# Patient Record
Sex: Female | Born: 1965 | Race: Black or African American | Hispanic: No | Marital: Married | Smoking: Former smoker
Health system: Southern US, Community
[De-identification: ages and names within clinical notes are randomized; demographics above are authoritative.]

## PROBLEM LIST (undated history)

## (undated) DIAGNOSIS — N390 Urinary tract infection, site not specified: Secondary | ICD-10-CM

## (undated) DIAGNOSIS — F419 Anxiety disorder, unspecified: Secondary | ICD-10-CM

## (undated) DIAGNOSIS — E049 Nontoxic goiter, unspecified: Secondary | ICD-10-CM

## (undated) DIAGNOSIS — A630 Anogenital (venereal) warts: Secondary | ICD-10-CM

## (undated) DIAGNOSIS — I10 Essential (primary) hypertension: Secondary | ICD-10-CM

## (undated) DIAGNOSIS — R946 Abnormal results of thyroid function studies: Secondary | ICD-10-CM

## (undated) DIAGNOSIS — K635 Polyp of colon: Secondary | ICD-10-CM

## (undated) DIAGNOSIS — Q950 Balanced translocation and insertion in normal individual: Secondary | ICD-10-CM

## (undated) HISTORY — PX: TONSILLECTOMY: SUR1361

## (undated) HISTORY — PX: CARPAL TUNNEL RELEASE: SHX101

## (undated) HISTORY — DX: Essential (primary) hypertension: I10

## (undated) HISTORY — DX: Urinary tract infection, site not specified: N39.0

## (undated) HISTORY — PX: FOOT SURGERY: SHX648

## (undated) HISTORY — DX: Balanced translocation and insertion in normal individual: Q95.0

## (undated) HISTORY — PX: UNILATERAL SALPINGECTOMY: SHX6160

## (undated) HISTORY — DX: Anogenital (venereal) warts: A63.0

## (undated) HISTORY — DX: Nontoxic goiter, unspecified: E04.9

## (undated) HISTORY — DX: Abnormal results of thyroid function studies: R94.6

## (undated) HISTORY — PX: ABDOMINAL HYSTERECTOMY: SHX81

## (undated) HISTORY — DX: Anxiety disorder, unspecified: F41.9

## (undated) HISTORY — DX: Polyp of colon: K63.5

---

## 2001-11-13 HISTORY — PX: BREAST BIOPSY: SHX20

## 2012-11-13 DIAGNOSIS — K635 Polyp of colon: Secondary | ICD-10-CM

## 2012-11-13 HISTORY — DX: Polyp of colon: K63.5

## 2013-12-22 ENCOUNTER — Emergency Department: Payer: Self-pay | Admitting: Emergency Medicine

## 2013-12-22 LAB — CBC
HCT: 38.9 % (ref 35.0–47.0)
HGB: 12.9 g/dL (ref 12.0–16.0)
MCH: 30.8 pg (ref 26.0–34.0)
MCHC: 33.2 g/dL (ref 32.0–36.0)
MCV: 93 fL (ref 80–100)
PLATELETS: 270 10*3/uL (ref 150–440)
RBC: 4.2 10*6/uL (ref 3.80–5.20)
RDW: 13.8 % (ref 11.5–14.5)
WBC: 8.1 10*3/uL (ref 3.6–11.0)

## 2013-12-22 LAB — COMPREHENSIVE METABOLIC PANEL
Albumin: 3.7 g/dL (ref 3.4–5.0)
Alkaline Phosphatase: 100 U/L
Anion Gap: 2 — ABNORMAL LOW (ref 7–16)
BILIRUBIN TOTAL: 0.4 mg/dL (ref 0.2–1.0)
BUN: 15 mg/dL (ref 7–18)
CALCIUM: 9.2 mg/dL (ref 8.5–10.1)
CHLORIDE: 106 mmol/L (ref 98–107)
Co2: 32 mmol/L (ref 21–32)
Creatinine: 0.88 mg/dL (ref 0.60–1.30)
EGFR (African American): >60
EGFR (Non-African Amer.): >60
Glucose: 88 mg/dL (ref 65–99)
OSMOLALITY: 280 (ref 275–301)
POTASSIUM: 3.4 mmol/L — AB (ref 3.5–5.1)
SGOT(AST): 33 U/L (ref 15–37)
SGPT (ALT): 35 U/L (ref 12–78)
Sodium: 140 mmol/L (ref 136–145)
Total Protein: 7.4 g/dL (ref 6.4–8.2)

## 2013-12-22 LAB — URINALYSIS, COMPLETE
Bacteria: NONE SEEN
Bilirubin,UR: NEGATIVE
Blood: NEGATIVE
Glucose,UR: NEGATIVE mg/dL (ref 0–75)
Ketone: NEGATIVE
LEUKOCYTE ESTERASE: NEGATIVE
Nitrite: NEGATIVE
Ph: 5 (ref 4.5–8.0)
Protein: NEGATIVE
SQUAMOUS EPITHELIAL: NONE SEEN
Specific Gravity: 1.025 (ref 1.003–1.030)
WBC UR: 1 /HPF (ref 0–5)

## 2013-12-22 LAB — LIPASE, BLOOD: Lipase: 166 U/L (ref 73–393)

## 2014-01-15 ENCOUNTER — Other Ambulatory Visit: Payer: Self-pay | Admitting: Family Medicine

## 2014-01-15 LAB — LIPID PANEL
Cholesterol: 191 mg/dL (ref 0–200)
HDL Cholesterol: 49 mg/dL (ref 40–60)
LDL CHOLESTEROL, CALC: 126 mg/dL — AB (ref 0–100)
TRIGLYCERIDES: 78 mg/dL (ref 0–200)
VLDL Cholesterol, Calc: 16 mg/dL (ref 5–40)

## 2014-01-15 LAB — BASIC METABOLIC PANEL
ANION GAP: 2 — AB (ref 7–16)
BUN: 14 mg/dL (ref 7–18)
Calcium, Total: 9.3 mg/dL (ref 8.5–10.1)
Chloride: 106 mmol/L (ref 98–107)
Co2: 32 mmol/L (ref 21–32)
Creatinine: 0.71 mg/dL (ref 0.60–1.30)
GLUCOSE: 77 mg/dL (ref 65–99)
Osmolality: 279 (ref 275–301)
Potassium: 3.3 mmol/L — ABNORMAL LOW (ref 3.5–5.1)
Sodium: 140 mmol/L (ref 136–145)

## 2014-01-15 LAB — CBC WITH DIFFERENTIAL/PLATELET
Basophil #: 0 10*3/uL (ref 0.0–0.1)
Basophil %: 0.8 %
EOS ABS: 0.2 10*3/uL (ref 0.0–0.7)
Eosinophil %: 4.2 %
HCT: 38.9 % (ref 35.0–47.0)
HGB: 13.2 g/dL (ref 12.0–16.0)
LYMPHS ABS: 2.5 10*3/uL (ref 1.0–3.6)
Lymphocyte %: 50.9 %
MCH: 31.2 pg (ref 26.0–34.0)
MCHC: 34.1 g/dL (ref 32.0–36.0)
MCV: 92 fL (ref 80–100)
Monocyte #: 0.4 x10 3/mm (ref 0.2–0.9)
Monocyte %: 7.1 %
NEUTROS ABS: 1.8 10*3/uL (ref 1.4–6.5)
Neutrophil %: 37 %
Platelet: 251 10*3/uL (ref 150–440)
RBC: 4.24 10*6/uL (ref 3.80–5.20)
RDW: 13.6 % (ref 11.5–14.5)
WBC: 5 10*3/uL (ref 3.6–11.0)

## 2014-01-15 LAB — HEPATIC FUNCTION PANEL A (ARMC)
ALBUMIN: 3.9 g/dL (ref 3.4–5.0)
Alkaline Phosphatase: 114 U/L
BILIRUBIN TOTAL: 0.6 mg/dL (ref 0.2–1.0)
Bilirubin, Direct: 0.1 mg/dL (ref 0.00–0.20)
SGOT(AST): 18 U/L (ref 15–37)
SGPT (ALT): 25 U/L (ref 12–78)
Total Protein: 7.6 g/dL (ref 6.4–8.2)

## 2014-01-15 LAB — TSH: Thyroid Stimulating Horm: 0.421 u[IU]/mL — ABNORMAL LOW

## 2014-01-19 ENCOUNTER — Ambulatory Visit: Payer: Self-pay | Admitting: Gastroenterology

## 2014-02-12 ENCOUNTER — Other Ambulatory Visit: Payer: Self-pay | Admitting: Family Medicine

## 2014-02-12 LAB — BASIC METABOLIC PANEL
ANION GAP: 3 — AB (ref 7–16)
BUN: 12 mg/dL (ref 7–18)
CALCIUM: 9.1 mg/dL (ref 8.5–10.1)
CHLORIDE: 102 mmol/L (ref 98–107)
CO2: 31 mmol/L (ref 21–32)
Creatinine: 0.69 mg/dL (ref 0.60–1.30)
EGFR (African American): >60
GLUCOSE: 84 mg/dL (ref 65–99)
OSMOLALITY: 271 (ref 275–301)
Potassium: 3.5 mmol/L (ref 3.5–5.1)
SODIUM: 136 mmol/L (ref 136–145)

## 2014-02-12 LAB — TSH: THYROID STIMULATING HORM: 0.328 u[IU]/mL — AB

## 2014-02-12 LAB — T4, FREE: Free Thyroxine: 0.83 ng/dL (ref 0.76–1.46)

## 2014-02-19 ENCOUNTER — Telehealth: Payer: Self-pay | Admitting: *Deleted

## 2014-02-19 DIAGNOSIS — R102 Pelvic and perineal pain: Secondary | ICD-10-CM

## 2014-02-19 NOTE — Telephone Encounter (Signed)
Appointment for ultrasound 02/27/2014 @ 1115.  Spoke with patient, pt request to have ultrasound study at [facility].  Informed patient that she would have to ensure records are sent to [facility].  Call to [facility] ultrasound, they request for Dr. Pat Patrick to enter orders since he has privileges at [facility].  Call to Dr. Rolin Barry office, awaiting nurse to call back for instructions.  Amber called from Dr. Rolin Barry office and will schedule ultrasound at [facility]/inform patient.  Fax number given to have ultrasound results sent to [facility]

## 2014-02-24 ENCOUNTER — Ambulatory Visit: Payer: Self-pay | Admitting: Surgery

## 2014-02-27 ENCOUNTER — Ambulatory Visit (HOSPITAL_COMMUNITY): Payer: Private Health Insurance - Indemnity

## 2014-03-27 ENCOUNTER — Ambulatory Visit (INDEPENDENT_AMBULATORY_CARE_PROVIDER_SITE_OTHER): Payer: Private Health Insurance - Indemnity | Admitting: Nurse Practitioner

## 2014-03-27 ENCOUNTER — Encounter: Payer: Self-pay | Admitting: Nurse Practitioner

## 2014-03-27 VITALS — BP 109/71 | HR 64 | Temp 98.0°F | Ht 64.0 in | Wt 203.6 lb

## 2014-03-27 DIAGNOSIS — R102 Pelvic and perineal pain: Principal | ICD-10-CM

## 2014-03-27 DIAGNOSIS — E669 Obesity, unspecified: Secondary | ICD-10-CM

## 2014-03-27 DIAGNOSIS — N949 Unspecified condition associated with female genital organs and menstrual cycle: Secondary | ICD-10-CM

## 2014-03-27 DIAGNOSIS — G8929 Other chronic pain: Secondary | ICD-10-CM

## 2014-03-27 NOTE — Patient Instructions (Signed)
Pelvic Pain, Female °Female pelvic pain can be caused by many different things and start from a variety of places. Pelvic pain refers to pain that is located in the lower half of the abdomen and between your hips. The pain may occur over a short period of time (acute) or may be reoccurring (chronic). The cause of pelvic pain may be related to disorders affecting the female reproductive organs (gynecologic), but it may also be related to the bladder, kidney stones, an intestinal complication, or muscle or skeletal problems. Getting help right away for pelvic pain is important, especially if there has been severe, sharp, or a sudden onset of unusual pain. It is also important to get help right away because some types of pelvic pain can be life threatening.  °CAUSES  °Below are only some of the causes of pelvic pain. The causes of pelvic pain can be in one of several categories.  °· Gynecologic. °· Pelvic inflammatory disease. °· Sexually transmitted infection. °· Ovarian cyst or a twisted ovarian ligament (ovarian torsion). °· Uterine lining that grows outside the uterus (endometriosis). °· Fibroids, cysts, or tumors. °· Ovulation. °· Pregnancy. °· Pregnancy that occurs outside the uterus (ectopic pregnancy). °· Miscarriage. °· Labor. °· Abruption of the placenta or ruptured uterus. °· Infection. °· Uterine infection (endometritis). °· Bladder infection. °· Diverticulitis. °· Miscarriage related to a uterine infection (septic abortion). °· Bladder. °· Inflammation of the bladder (cystitis). °· Kidney stone(s). °· Gastrointenstinal. °· Constipation. °· Diverticulitis. °· Neurologic. °· Trauma. °· Feeling pelvic pain because of mental or emotional causes (psychosomatic). °· Cancers of the bowel or pelvis. °EVALUATION  °Your caregiver will want to take a careful history of your concerns. This includes recent changes in your health, a careful gynecologic history of your periods (menses), and a sexual history. Obtaining  your family history and medical history is also important. Your caregiver may suggest a pelvic exam. A pelvic exam will help identify the location and severity of the pain. It also helps in the evaluation of which organ system may be involved. In order to identify the cause of the pelvic pain and be properly treated, your caregiver may order tests. These tests may include:  °· A pregnancy test. °· Pelvic ultrasonography. °· An X-ray exam of the abdomen. °· A urinalysis or evaluation of vaginal discharge. °· Blood tests. °HOME CARE INSTRUCTIONS  °· Only take over-the-counter or prescription medicines for pain, discomfort, or fever as directed by your caregiver.   °· Rest as directed by your caregiver.   °· Eat a balanced diet.   °· Drink enough fluids to make your urine clear or pale yellow, or as directed.   °· Avoid sexual intercourse if it causes pain.   °· Apply warm or cold compresses to the lower abdomen depending on which one helps the pain.   °· Avoid stressful situations.   °· Keep a journal of your pelvic pain. Write down when it started, where the pain is located, and if there are things that seem to be associated with the pain, such as food or your menstrual cycle. °· Follow up with your caregiver as directed.   °SEEK MEDICAL CARE IF: °· Your medicine does not help your pain. °· You have abnormal vaginal discharge. °SEEK IMMEDIATE MEDICAL CARE IF:  °· You have heavy bleeding from the vagina.   °· Your pelvic pain increases.   °· You feel lightheaded or faint.   °· You have chills.   °· You have pain with urination or blood in your urine.   °· You have uncontrolled   diarrhea or vomiting.   °· You have a fever or persistent symptoms for more than 3 days. °· You have a fever and your symptoms suddenly get worse.   °· You are being physically or sexually abused.   °MAKE SURE YOU: °· Understand these instructions. °· Will watch your condition. °· Will get help if you are not doing well or get worse. °Document  Released: 09/26/2004 Document Revised: 04/30/2012 Document Reviewed: 02/19/2012 °ExitCare® Patient Information ©2014 [employer]. ° °

## 2014-03-27 NOTE — Progress Notes (Signed)
History:  Kelly Pittman is a 48 y.o. G3P0030 who presents to [facility] clinic today for chronic pelvic pain. She moved here from [place] . She started having pain 8 years ago and was determined to have fibroids. She had a hysterectomy and was better for some time. Then she started having pain around her left ovary and saw multiple MDs. She had a CT of pelvis which was negative. She was taken to surgery and found to have scar tissue wrapping around her bowel. This was cleaned up and all was going well until about 1 year ago when the pain came back on the right ovary. She has seen PCP who sent her to GI and her colonoscopy was negative and he referred her to GYN clinic. They ordered an ultrasound which was negative. The patients desire is to have another surgery to remove right ovary and scar tissue. She has not been on any HRT.   The following portions of the patient's history were reviewed and updated as appropriate: allergies, current medications, past family history, past medical history, past social history, past surgical history and problem list.  Review of Systems:  Pertinent items are noted in HPI.  Objective:  Physical Exam BP 109/71  Pulse 64  Temp(Src) 98 F (36.7 C) (Oral)  Ht 5\' 4"  (1.626 m)  Wt 203 lb 9.6 oz (92.352 kg)  BMI 34.93 kg/m2 GENERAL: Well-developed, well-nourished female in no acute distress.  Obese HEENT: Normocephalic, atraumatic.  NECK: Supple. Normal thyroid.  LUNGS: Normal rate. Clear to auscultation bilaterally.  HEART: Regular rate and rhythm with no adventitious sounds.  ABDOMEN: Soft, nontender, nondistended. No organomegaly. Normal bowel sounds appreciated in all quadrants.  PELVIC: Normal external female genitalia. Vagina is pink and rugated.  Normal discharge.  Slight right  adnexal  tenderness.  EXTREMITIES: No cyanosis, clubbing, or edema, 2+ distal pulses.   Labs and Imaging No results found.  Assessment & Plan:  Assessment:  A: Chronic pelvic  pain  Plans:  Discussed patient with Dr Ihor Dow who advised referral to [facility] pelvic specialist and patient will be called back with that name and phone number  Advised her to establish GYN care  Olegario Messier, NP 03/27/2014 1:30 PM

## 2014-03-30 ENCOUNTER — Telehealth: Payer: Self-pay | Admitting: *Deleted

## 2014-03-30 NOTE — Telephone Encounter (Signed)
Message copied by Sue Lush on Mon Mar 30, 2014 10:35 AM ------      Message from: Excell Seltzer      Created: Fri Mar 27, 2014  5:04 PM      Regarding: Release of Information Needed       This patient was seen in clinic today 03/27/14.  She lives in [place] and was previously in [place].  She is going to be referred to [facility].  We need to have her come by the office to sign a release of information so we can get her records from [place] to send with the referral.  Could you please contact the patient and ask her to come by to sign the Release of Information and let us know which office she was seen at?      Thanks,      Claiborne Billings ------

## 2014-03-30 NOTE — Telephone Encounter (Signed)
Spoke with patient about ROI, patient's fax machine does not work at this time.  Desires to have form emailed to her, Cathlean Marseilles approved.  Email address obtained [email or web address]. Informed patient that we need the clinic's information in [place].  Pt verbalized understanding.

## 2014-04-02 ENCOUNTER — Telehealth: Payer: Self-pay

## 2014-04-02 NOTE — Telephone Encounter (Signed)
Called Dr. Kirk Ruths office; provider is retired, however, it was his Network engineer. Administrator stated we simply needed to fax clinic notes and reason patient needed to be seen along with patient's contact information to [phone]. Per Cathlean Marseilles, ROI has been faxed to patient's provider in [place] (was confirmed they received it). Awaiting records to be faxed to [place]; once all records are received we can fax [place] records along with patient's last clinic note to fax number listed above. Called patient to inform her we are working on referral; gave her [facility] Office number [phone]) so that she can follow up on referral in the next week or two.

## 2014-04-02 NOTE — Telephone Encounter (Signed)
Message copied by Geanie Logan on Thu Apr 02, 2014  4:24 PM ------      Message from: BAREFOOT, [place] M      Created: Wed Apr 01, 2014  7:53 PM       Can we call this patient to refer her for pelvic surgery in [facility]. The doctor is Dr Elby Showers at [phone]. The appointment does not have to be with him exactly anyone in group is fine. She will need to get her records together from [place], thanks, Danielson ------

## 2014-04-07 NOTE — Telephone Encounter (Signed)
Called Dr. Maxie Barb office in [place] [phone])-- stated they received fax on Thursday and will fax records today.

## 2014-04-08 NOTE — Telephone Encounter (Signed)
Called Dr. Maxie Barb office again to notify them we have not received records and gave them our fax number again. She stated she will check into it.

## 2014-04-09 NOTE — Telephone Encounter (Signed)
Called Dr. Maxie Barb office and spoke to [place] who asked that we refax ROI and she will get to it today. ROI refaxed. Awaiting records.

## 2014-04-10 NOTE — Telephone Encounter (Signed)
Called Dr. Maxie Barb office again. Spoke to [place] in billing who confirmed she did receive ROI yesterday, however, they have been short staffed and she is behind. Will attempt to get records to [place] by the end of day today.

## 2014-04-14 NOTE — Telephone Encounter (Signed)
Received all office notes as well as ultrasound results from Dr. Maxie Barb office. Records and latest office visit here in clinic faxed along with patient's contact information to [phone] [facility] [place]); fax successful. Office receptionist stated a nurse would review the referral and the office would call patient with appointment. Called patient and informed her that records have been faxed and she should be contacted with appointment within the next few days-- number to office given so that she may call and check up on referral appointment. No questions or concerns.

## 2014-04-14 NOTE — Telephone Encounter (Signed)
Received patient's pap and mammogram results, however, no office notes faxed. Called Dr. Maxie Barb office. Spoke to [place] who apologized for not faxing office notes-- has two in patient's chart that she will fax today. Once received can fax records with our office note to [phone].

## 2014-04-29 ENCOUNTER — Encounter: Payer: Self-pay | Admitting: *Deleted

## 2014-05-01 DIAGNOSIS — R109 Unspecified abdominal pain: Secondary | ICD-10-CM | POA: Insufficient documentation

## 2014-06-02 ENCOUNTER — Ambulatory Visit: Payer: Self-pay | Admitting: Family Medicine

## 2014-06-05 DIAGNOSIS — R198 Other specified symptoms and signs involving the digestive system and abdomen: Secondary | ICD-10-CM | POA: Insufficient documentation

## 2014-06-18 ENCOUNTER — Encounter: Payer: Self-pay | Admitting: Obstetrics and Gynecology

## 2014-07-08 ENCOUNTER — Encounter: Payer: Self-pay | Admitting: *Deleted

## 2014-07-14 ENCOUNTER — Encounter: Payer: Self-pay | Admitting: Obstetrics and Gynecology

## 2014-08-13 ENCOUNTER — Encounter: Payer: Self-pay | Admitting: Obstetrics and Gynecology

## 2014-08-27 ENCOUNTER — Ambulatory Visit: Payer: Self-pay | Admitting: Family Medicine

## 2014-08-27 LAB — COMPREHENSIVE METABOLIC PANEL
ALBUMIN: 3.7 g/dL (ref 3.4–5.0)
ANION GAP: 6 — AB (ref 7–16)
Alkaline Phosphatase: 108 U/L
BUN: 12 mg/dL (ref 7–18)
Bilirubin,Total: 0.2 mg/dL (ref 0.2–1.0)
Calcium, Total: 8.8 mg/dL (ref 8.5–10.1)
Chloride: 105 mmol/L (ref 98–107)
Co2: 29 mmol/L (ref 21–32)
Creatinine: 0.79 mg/dL (ref 0.60–1.30)
EGFR (African American): >60
Glucose: 73 mg/dL (ref 65–99)
POTASSIUM: 3.9 mmol/L (ref 3.5–5.1)
SGOT(AST): 26 U/L (ref 15–37)
SGPT (ALT): 28 U/L
Sodium: 140 mmol/L (ref 136–145)
Total Protein: 7.5 g/dL (ref 6.4–8.2)

## 2014-08-27 LAB — CBC WITH DIFFERENTIAL/PLATELET
BASOS PCT: 0.7 %
Basophil #: 0.1 10*3/uL (ref 0.0–0.1)
EOS PCT: 3.1 %
Eosinophil #: 0.2 10*3/uL (ref 0.0–0.7)
HCT: 41.5 % (ref 35.0–47.0)
HGB: 13.3 g/dL (ref 12.0–16.0)
LYMPHS ABS: 4.3 10*3/uL — AB (ref 1.0–3.6)
Lymphocyte %: 53.5 %
MCH: 30.4 pg (ref 26.0–34.0)
MCHC: 32.1 g/dL (ref 32.0–36.0)
MCV: 95 fL (ref 80–100)
MONOS PCT: 4.7 %
Monocyte #: 0.4 x10 3/mm (ref 0.2–0.9)
Neutrophil #: 3 10*3/uL (ref 1.4–6.5)
Neutrophil %: 38 %
Platelet: 263 10*3/uL (ref 150–440)
RBC: 4.38 10*6/uL (ref 3.80–5.20)
RDW: 13.3 % (ref 11.5–14.5)
WBC: 8 10*3/uL (ref 3.6–11.0)

## 2014-08-27 LAB — HEPATIC FUNCTION PANEL A (ARMC): Bilirubin, Direct: <0.1 mg/dL (ref 0.00–0.20)

## 2014-09-13 ENCOUNTER — Encounter: Payer: Self-pay | Admitting: Obstetrics and Gynecology

## 2014-09-14 ENCOUNTER — Encounter: Payer: Self-pay | Admitting: Nurse Practitioner

## 2014-10-13 ENCOUNTER — Encounter: Payer: Self-pay | Admitting: Obstetrics and Gynecology

## 2014-11-13 ENCOUNTER — Encounter: Payer: Self-pay | Admitting: Obstetrics and Gynecology

## 2014-12-14 ENCOUNTER — Encounter: Payer: Self-pay | Admitting: Obstetrics and Gynecology

## 2014-12-17 ENCOUNTER — Ambulatory Visit: Payer: Self-pay | Admitting: Family Medicine

## 2014-12-17 LAB — COMPREHENSIVE METABOLIC PANEL
ALBUMIN: 3.9 g/dL (ref 3.4–5.0)
AST: 25 U/L (ref 15–37)
Alkaline Phosphatase: 105 U/L (ref 46–116)
Anion Gap: 5 — ABNORMAL LOW (ref 7–16)
BUN: 13 mg/dL (ref 7–18)
Bilirubin,Total: 0.4 mg/dL (ref 0.2–1.0)
CALCIUM: 9.3 mg/dL (ref 8.5–10.1)
Chloride: 102 mmol/L (ref 98–107)
Co2: 34 mmol/L — ABNORMAL HIGH (ref 21–32)
Creatinine: 0.84 mg/dL (ref 0.60–1.30)
Glucose: 73 mg/dL (ref 65–99)
Osmolality: 280 (ref 275–301)
Potassium: 3.2 mmol/L — ABNORMAL LOW (ref 3.5–5.1)
SGPT (ALT): 22 U/L (ref 14–63)
Sodium: 141 mmol/L (ref 136–145)
Total Protein: 8.1 g/dL (ref 6.4–8.2)

## 2014-12-17 LAB — CBC WITH DIFFERENTIAL/PLATELET
BASOS ABS: 0.1 10*3/uL (ref 0.0–0.1)
Basophil %: 0.8 %
Eosinophil #: 0.2 10*3/uL (ref 0.0–0.7)
Eosinophil %: 2.8 %
HCT: 41.1 % (ref 35.0–47.0)
HGB: 13.8 g/dL (ref 12.0–16.0)
LYMPHS ABS: 3.9 10*3/uL — AB (ref 1.0–3.6)
Lymphocyte %: 55.2 %
MCH: 31.1 pg (ref 26.0–34.0)
MCHC: 33.6 g/dL (ref 32.0–36.0)
MCV: 93 fL (ref 80–100)
MONOS PCT: 6.1 %
Monocyte #: 0.4 x10 3/mm (ref 0.2–0.9)
NEUTROS ABS: 2.5 10*3/uL (ref 1.4–6.5)
Neutrophil %: 35.1 %
PLATELETS: 285 10*3/uL (ref 150–440)
RBC: 4.43 10*6/uL (ref 3.80–5.20)
RDW: 13.5 % (ref 11.5–14.5)
WBC: 7 10*3/uL (ref 3.6–11.0)

## 2014-12-17 LAB — TSH: THYROID STIMULATING HORM: 0.658 u[IU]/mL

## 2015-01-12 ENCOUNTER — Encounter: Payer: Self-pay | Admitting: Obstetrics and Gynecology

## 2015-02-18 DIAGNOSIS — R0982 Postnasal drip: Secondary | ICD-10-CM | POA: Insufficient documentation

## 2015-02-18 DIAGNOSIS — I10 Essential (primary) hypertension: Secondary | ICD-10-CM | POA: Insufficient documentation

## 2015-02-18 DIAGNOSIS — F411 Generalized anxiety disorder: Secondary | ICD-10-CM | POA: Insufficient documentation

## 2015-02-18 DIAGNOSIS — J302 Other seasonal allergic rhinitis: Secondary | ICD-10-CM | POA: Insufficient documentation

## 2015-02-18 HISTORY — DX: Postnasal drip: R09.82

## 2015-02-18 HISTORY — DX: Generalized anxiety disorder: F41.1

## 2016-02-23 ENCOUNTER — Ambulatory Visit: Payer: BLUE CROSS/BLUE SHIELD | Attending: Unknown Physician Specialty | Admitting: Physical Therapy

## 2016-02-23 DIAGNOSIS — R262 Difficulty in walking, not elsewhere classified: Secondary | ICD-10-CM | POA: Insufficient documentation

## 2016-02-23 DIAGNOSIS — M791 Myalgia, unspecified site: Secondary | ICD-10-CM

## 2016-02-23 DIAGNOSIS — M533 Sacrococcygeal disorders, not elsewhere classified: Secondary | ICD-10-CM

## 2016-02-23 DIAGNOSIS — M6281 Muscle weakness (generalized): Secondary | ICD-10-CM

## 2016-02-24 NOTE — Patient Instructions (Signed)
Diaphragmatic Breathing/ Pelvic breathing to relax pelvic floor muscles  Scaro-iliac joint re-alignment: 2x  /day  Muscle energy technique 5 sec holds R thigh up against palm, pressing L foot down into foot   Standing with swinging leg back (toe touch) back 10 reps x 3 Left side only

## 2016-02-24 NOTE — Therapy (Signed)
[place] MAIN [facility] SERVICES [address] [place], [place], [address] Phone: [phone]   Fax:  [phone]  Physical Therapy Evaluation  Patient Details  Name: Kelly Pittman MRN: [id number] Date of Birth: 1966/01/06 Referring Provider: Ammie Dalton  Encounter Date: 02/23/2016      PT End of Session - 02/24/16 2319    Visit Number 1   Number of Visits 12   Date for PT Re-Evaluation 05/17/16   PT Start Time 1505   PT Stop Time 1610   PT Time Calculation (min) 65 min   Activity Tolerance Patient tolerated treatment well;No increased pain   Behavior During Therapy [facility] for tasks assessed/performed      Past Medical History  Diagnosis Date  . Hypertension   . Anxiety     Past Surgical History  Procedure Laterality Date  . Abdominal hysterectomy    . Unilateral salpingectomy      left  . Carpal tunnel release      There were no vitals filed for this visit.           [facility] PT Assessment - 02/24/16 2315    Assessment   Medical Diagnosis Pelvic pain    Referring Provider Ammie Dalton   Precautions   Precautions None   Restrictions   Weight Bearing Restrictions No   Balance Screen   Has the patient fallen in the past 6 months No   Observation/Other Assessments   Observations hyperextended knees, flat feet    Other Surveys  --  LEFS 66%, PDI 39%    Step Down   Comments LOB with RLE step down   Single Leg Stance   Comments poor stability with R SLS, L SLS    AROM   Overall AROM Comments L thigh adduction with pain   (Post Tx, no pain with L hip adduction/IR)   PROM   Overall PROM Comments hip IR R:15deg w/ springy endfeel with compensation of lumbar movment, L 20 deg   (post-Tx: increased R hip IR to 20 deg    Strength   Overall Strength Comments R eversion, inversion, plantarflexion 2/5 (10 reps) , L plantarflexion 3/5 for 10 reps, 3/5 eversion. inversion    Palpation   SI assessment  R ASIS more posterior >  L. Tightness at L sacrotuberious ligament with limited and painful L hip extension PROM. (post Tx: more symmetry in pelvic girdle and increase hip ext without pain)    Palpation comment pain with L pubic rami,    Ambulation/Gait   Gait velocity 1.05 m/s    Gait Comments excessive knee ext in swing on LLE, decreased weight bearing on stance bilaterally                  Pelvic Floor Special Questions - 02/23/16 1622    Pelvic Floor Internal Exam pt consented verbally and had no contraindications   Exam Type Vaginal   Palpation noted tenderness on R obt int anterior , tightness on posterior 2-3rd muscles on L. Cued for relaxation of pelvic floor and pt was able to decrease tenderenss and tightness           [facility] Adult PT Treatment/Exercise - 02/23/16 1537    Manual Therapy   Manual therapy comments Sidelying, rotational mob Grade II to faciliate L hip ext, PA mobs L lateral border of sacrum, STM along L sacrotuberous   ligament  PT Education - 02/24/16 2319    Education provided Yes   Education Details POC, goals, anatomy/physiology    Person(s) Educated Patient   Methods Explanation;Demonstration;Verbal cues;Tactile cues;Handout   Comprehension Returned demonstration;Verbalized understanding             PT Long Term Goals - 02/24/16 2320    PT LONG TERM GOAL #1   Title Pt will decrease her score on [place] from 39% to < 29% in order particiapte in community and social events.    Time 12   Period Weeks   Status New   PT LONG TERM GOAL #2   Title Pt will decrease her score on LEFS from 66% to < 50% in order to improve balance and minimize future risk for injuries.   Time 12   Period Weeks   Status New   PT LONG TERM GOAL #3   Title Pt will demo no asymmetries in her sacroiliac joint across two visits in order to minimize pelvic pain and foot pain.    Time 12   Period Weeks   Status New   PT LONG TERM GOAL #4   Title Pt will demo increased  ankle strength (eversion/inversion/ plantarflexion ) to 4/5 bilaterally in order to walk longer distances   Time 12   Period Weeks   Status New   PT LONG TERM GOAL #5   Title Pt will increase her walking speed from 1.05 m/s to >1.2 m/s in order to cross the street safely.   Time 12   Period Weeks   Status New               Plan - 02/24/16 2326    Clinical Impression Statement Pt is a 50 yo female who complains of  pelvic pain and ankle pain in addition to ankle weakness, and instability. These deficits impact her  ability to stand for long periods at work and performing household chores.   Through PT exam findings, pt's deficits are contributed to gait  deviations she incurred when wearing a cast from her Achilles Fx which  occured last year.  Pt demo'd pelvic obliquities, increased pelvic floor mm  and hip girdle soft-tissue tensions, ankle weakness, gait deviations, and poor balance. Pt  tolerated manual Tx today and showed more symmetrically alignment SIJ,  increased hip ROM, and reported decreased pain.        Rehab Potential Good   PT Frequency 1x / week   PT Duration 12 weeks   PT Treatment/Interventions Aquatic Therapy;ADLs/Self Care Home Management;Gait training;Moist Heat;Traction;Stair training;Functional mobility training;Therapeutic activities;Therapeutic exercise;Balance training;Neuromuscular re-education;Manual techniques;Patient/family education;Passive range of motion;Energy conservation;Dry needling;Taping   Consulted and Agree with Plan of Care Patient      Patient will benefit from skilled therapeutic intervention in order to improve the following deficits and impairments:  Abnormal gait, Improper body mechanics, Pain, Postural dysfunction, Increased muscle spasms, Decreased mobility, Decreased coordination, Decreased activity tolerance, Decreased endurance, Decreased range of motion, Hypomobility, Decreased strength, Decreased balance, Decreased safety  awareness, Difficulty walking, Impaired flexibility  Visit Diagnosis:  Sacrococcygeal disorders, not elsewhere classified - Plan: PT plan of care cert/re-cert Difficulty in walking, not elsewhere classified - Plan: PT plan of care cert/re-cert Myalgia - Plan: PT plan of care cert/re-cert  Muscle weakness (generalized) - Plan: PT plan of care cert/re-cert     Problem List Patient Active Problem List   Diagnosis Date Noted  . Chronic pelvic pain in female 03/27/2014  . Obesity 03/27/2014    Talmadge Chad  Lanora Manis, DPT, E-RYT  02/24/2016, 11:34 PM  [place] MAIN [facility] SERVICES [address] [place], [place], [address] Phone: [phone]   Fax:  [phone]  Name: Nyia Cornia MRN: [id number] Date of Birth: [birth year]

## 2016-03-01 ENCOUNTER — Ambulatory Visit: Payer: BLUE CROSS/BLUE SHIELD | Admitting: Physical Therapy

## 2016-03-01 DIAGNOSIS — M533 Sacrococcygeal disorders, not elsewhere classified: Secondary | ICD-10-CM | POA: Diagnosis not present

## 2016-03-01 DIAGNOSIS — R262 Difficulty in walking, not elsewhere classified: Secondary | ICD-10-CM

## 2016-03-01 DIAGNOSIS — M791 Myalgia, unspecified site: Secondary | ICD-10-CM

## 2016-03-01 DIAGNOSIS — M6281 Muscle weakness (generalized): Secondary | ICD-10-CM

## 2016-03-03 NOTE — Therapy (Addendum)
[place] MAIN [facility] SERVICES [address] [place], [place], [address] Phone: [phone]   Fax:  [phone]  Physical Therapy Treatment  Patient Details  Name: Kelly Pittman MRN: [id number] Date of Birth: 06-12-1966 Referring Provider: Ammie Dalton  Encounter Date: 03/01/2016      PT End of Session - 03/03/16 0036    Visit Number 2   Number of Visits 12   Date for PT Re-Evaluation 05/17/16   PT Start Time 1500   PT Stop Time 1630   PT Time Calculation (min) 90 min   Activity Tolerance Patient tolerated treatment well;No increased pain   Behavior During Therapy [facility] for tasks assessed/performed      Past Medical History  Diagnosis Date  . Hypertension   . Anxiety     Past Surgical History  Procedure Laterality Date  . Abdominal hysterectomy    . Unilateral salpingectomy      left  . Carpal tunnel release      There were no vitals filed for this visit.      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 Pt return to working and her hip started bothering her in addition to her R foot pain.    Patient Stated Goals To not have pelvic pain             [facility] PT Assessment - 03/03/16 0025    AROM   Overall AROM Comments DF standing R 50 deg, L 70 deg (gastroc)     PROM   Overall PROM Comments PROM in supine R DF 0 deg, L DF 20 deg    Palpation   SI assessment  minor tenderness on R ASIS, more symmetry noted   Palpation comment .5 cm greater on R lateral malleolus compared to L (figure -8)   Ambulation/Gait   Gait Comments decreased stance phase on R                     [facility] Adult PT Treatment/Exercise - 03/03/16 0025    Therapeutic Activites    Other Therapeutic Activities feet propped under knees, heel pumps (PF) when seated.  when standing, semi tandem stance (DF) , mini  squats    Neuro Re-ed    Neuro Re-ed Details  gait mechanics    Manual Therapy   Manual therapy comments distraction/ AP mobs at talocural Grade III 90 sec, manual lymph drainage abdomen, neck, flank, legs    taping to decrease swelling                PT Education - 03/03/16 0035    Education provided Yes   Education Details HEP   Person(s) Educated Patient   Methods Explanation;Demonstration;Tactile cues;Verbal cues;Handout   Comprehension Returned demonstration;Verbalized understanding             PT Long Term Goals - 02/24/16 2320    PT LONG TERM GOAL #1   Title Pt will decrease her score on [place] from 39% to < 29% in order particiapte in community and social events.    Time 12   Period Weeks   Status New   PT LONG TERM GOAL #2   Title Pt will decrease her score on LEFS from 66% to < 50% in order to improve balance and minimize future risk for injuries.   Time 12  Period Weeks   Status New   PT LONG TERM GOAL #3   Title Pt will demo no asymmetries in her sacroiliac joint across two visits in order to minimize pelvic pain and foot pain.    Time 12   Period Weeks   Status New   PT LONG TERM GOAL #4   Title Pt will demo increased ankle strength (eversion/inversion/ plantarflexion ) to 4/5 bilaterally in order to walk longer distances   Time 12   Period Weeks   Status New   PT LONG TERM GOAL #5   Title Pt will increase her walking speed from 1.05 m/s to >1.2 m/s in order to cross the street safely.   Time 12   Period Weeks   Status New               Plan - 03/03/16 0038    Clinical Impression Statement Pt reported no L groin pain over the weekend after last session despite strenuous activities (ground to rise, lifting when gardening). However, pt's Sx at L groin and R foot pain  returned after returning to work. Suspect L groin pain may be related to R foot pain/ injury. Today, PT addressed swelling at lateral malleoli on R, modifications to standing  and sitting postures at work in order to stimulate lymph flow/minimize swelling/ increased DF ROM.  Pt will need to increase intrinsic feet mm strength and arch of her feet in order to improve pain, decrease swelling, increace propioception, balance, and gait speed. Pt will continue to required skilled PT to advance towards her goals.   Rehab Potential Good   PT Frequency 1x / week   PT Duration 12 weeks   PT Treatment/Interventions Aquatic Therapy;ADLs/Self Care Home Management;Gait training;Moist Heat;Traction;Stair training;Functional mobility training;Therapeutic activities;Therapeutic exercise;Balance training;Neuromuscular re-education;Manual techniques;Patient/family education;Passive range of motion;Energy conservation;Dry needling;Taping   Consulted and Agree with Plan of Care Patient      Patient will benefit from skilled therapeutic intervention in order to improve the following deficits and impairments:  Abnormal gait, Improper body mechanics, Pain, Postural dysfunction, Increased muscle spasms, Decreased mobility, Decreased coordination, Decreased activity tolerance, Decreased endurance, Decreased range of motion, Hypomobility, Decreased strength, Decreased balance, Decreased safety awareness, Difficulty walking, Impaired flexibility  Visit Diagnosis: Sacrococcygeal disorders, not elsewhere classified  Difficulty in walking, not elsewhere classified  Myalgia  Muscle weakness (generalized)      Problem List Patient Active Problem List   Diagnosis Date Noted  . Chronic pelvic pain in female 03/27/2014  . Obesity 03/27/2014    [name] ,PT, DPT, E-RYT  03/03/2016, 12:38 AM  [place] MAIN [facility] SERVICES [address] [place], [place], [address] Phone: [phone]   Fax:  [phone]  Name: Kelly Pittman MRN: [id number] Date of Birth: [birth year]

## 2016-03-03 NOTE — Patient Instructions (Signed)
feet propped under knees, heel pumps (PF) when seated.  when standing, semi tandem stance (DF) , mini squats   More anterior center of mass over [place] of feet when walking

## 2016-03-08 ENCOUNTER — Encounter: Payer: Private Health Insurance - Indemnity | Admitting: Physical Therapy

## 2016-03-15 ENCOUNTER — Ambulatory Visit: Payer: BLUE CROSS/BLUE SHIELD | Attending: Unknown Physician Specialty | Admitting: Physical Therapy

## 2016-03-15 DIAGNOSIS — M533 Sacrococcygeal disorders, not elsewhere classified: Secondary | ICD-10-CM | POA: Diagnosis present

## 2016-03-15 DIAGNOSIS — M791 Myalgia, unspecified site: Secondary | ICD-10-CM

## 2016-03-15 DIAGNOSIS — M6281 Muscle weakness (generalized): Secondary | ICD-10-CM | POA: Diagnosis present

## 2016-03-15 DIAGNOSIS — R262 Difficulty in walking, not elsewhere classified: Secondary | ICD-10-CM

## 2016-03-15 NOTE — Patient Instructions (Signed)
Modified leg lifts to one leg at a time with deep core engaged and no arching or straining of back and belly   Happy baby or butterfly pose with pillows under knees  5-10 breaths    Extended side angle with L leg in front  5 breaths   Walking marches with touch of hand on thigh to increase range   Belly massage (scooping stroke all four corners) 10 x each corner--> inner thigh brush up to belly button

## 2016-03-16 NOTE — Therapy (Addendum)
[place] MAIN [facility] SERVICES [address] [place], [place], [address] Phone: [phone]   Fax:  [phone]  Physical Therapy Treatment  Patient Details  Name: Kelly Pittman MRN: [id number] Date of Birth: 1966/05/12 Referring Provider: Ammie Dalton  Encounter Date: 03/15/2016      PT End of Session - 03/15/16 2357    Visit Number 3   Number of Visits 12   Date for PT Re-Evaluation 05/17/16   PT Start Time 1500   PT Stop Time 1605   PT Time Calculation (min) 65 min   Activity Tolerance Patient tolerated treatment well;No increased pain   Behavior During Therapy [facility] for tasks assessed/performed      Past Medical History  Diagnosis Date  . Hypertension   . Anxiety     Past Surgical History  Procedure Laterality Date  . Abdominal hysterectomy    . Unilateral salpingectomy      left  . Carpal tunnel release      There were no vitals filed for this visit.      Subjective Assessment - 03/15/16 1502    Subjective Pt reports her pelvic pain has increased since her last session. It has increased from 2/10 to 8/10   and also she feels it in the back of L thigh. The pain is located L groin area where she had previous pelvic pain 1/5 years ago that was resolved with PT.  Pt states her R foot pain has improved by 50% and she no longer has to put ice on it at the end of the day.  It no longer hurts everyday and with walking , it occurs only 25% of the time.     Patient Stated Goals To not have pelvic pain             [facility] PT Assessment - 03/15/16 2349    Other:   Other/ Comments self-selected double leg lifts with extensor compensation   AROM   Overall AROM Comments DF standing R 60 deg, L 70 deg (gastroc)     Palpation   SI assessment  symmetrical alignment of ASIS.     Palpation comment no swelling at R ankle. Fascial restrictions at fascial rsuprapubic, pubic symphysis,  adductor magnus,brevis, longus     Ambulation/Gait    Gait Comments pre-Tx: antalgic gait due to pelvic pain. Post-Tx: normal gait with longer step length, improved L swing phase,                      [facility] Adult PT Treatment/Exercise - 03/15/16 2353    Therapeutic Activites    Other Therapeutic Activities see pt instructions   Manual Therapy   Manual therapy comments manual lymph drainage at abdomen, inguinal B. Fascial releases over problem areas dscribed under assessments  guided pt for self-lymph massage                PT Education - 03/15/16 2356    Education provided Yes   Education Details HEP   Person(s) Educated Patient   Methods Demonstration;Explanation;Tactile cues;Verbal cues;Handout   Comprehension Returned demonstration;Verbalized understanding             PT Long Term Goals - 02/24/16 2320    PT LONG TERM GOAL #1   Title Pt will decrease her score on [place] from 39% to < 29% in order particiapte in community and social events.    Time 12   Period Weeks   Status  New   PT LONG TERM GOAL #2   Title Pt will decrease her score on LEFS from 66% to < 50% in order to improve balance and minimize future risk for injuries.   Time 12   Period Weeks   Status New   PT LONG TERM GOAL #3   Title Pt will demo no asymmetries in her sacroiliac joint across two visits in order to minimize pelvic pain and foot pain.    Time 12   Period Weeks   Status New   PT LONG TERM GOAL #4   Title Pt will demo increased ankle strength (eversion/inversion/ plantarflexion ) to 4/5 bilaterally in order to walk longer distances   Time 12   Period Weeks   Status New   PT LONG TERM GOAL #5   Title Pt will increase her walking speed from 1.05 m/s to >1.2 m/s in order to cross the street safely.   Time 12   Period Weeks   Status New               Plan - 03/15/16 2358    Clinical Impression Statement Pt showed improved ankle AROM/PROM after last session. After manual Tx today, pt showed improved gait and less  fascial restrictions at pubic symphysis area, low abdomen, and L adductors. Pt reported feeling less pelvic pain.  Pt continues to advance towards her goals and will benefit from skilled PT.   Rehab Potential Good   PT Frequency 1x / week   PT Duration 12 weeks   PT Treatment/Interventions Aquatic Therapy;ADLs/Self Care Home Management;Gait training;Moist Heat;Traction;Stair training;Functional mobility training;Therapeutic activities;Therapeutic exercise;Balance training;Neuromuscular re-education;Manual techniques;Patient/family education;Passive range of motion;Energy conservation;Dry needling;Taping   Consulted and Agree with Plan of Care Patient      Patient will benefit from skilled therapeutic intervention in order to improve the following deficits and impairments:  Abnormal gait, Improper body mechanics, Pain, Postural dysfunction, Increased muscle spasms, Decreased mobility, Decreased coordination, Decreased activity tolerance, Decreased endurance, Decreased range of motion, Hypomobility, Decreased strength, Decreased balance, Decreased safety awareness, Difficulty walking, Impaired flexibility  Visit Diagnosis: Sacrococcygeal disorders, not elsewhere classified  Difficulty in walking, not elsewhere classified  Myalgia  Muscle weakness (generalized)     Problem List Patient Active Problem List   Diagnosis Date Noted  . Chronic pelvic pain in female 03/27/2014  . Obesity 03/27/2014    [name] ,PT, DPT, E-RYT  03/16/2016, 12:01 AM  [place] MAIN [facility] SERVICES [address] [place], [place], [address] Phone: [phone]   Fax:  [phone]  Name: Kelly Pittman MRN: [id number] Date of Birth: [birth year]

## 2016-03-23 ENCOUNTER — Ambulatory Visit: Payer: BLUE CROSS/BLUE SHIELD | Admitting: Physical Therapy

## 2016-03-23 DIAGNOSIS — R262 Difficulty in walking, not elsewhere classified: Secondary | ICD-10-CM | POA: Diagnosis not present

## 2016-03-23 DIAGNOSIS — M6281 Muscle weakness (generalized): Secondary | ICD-10-CM

## 2016-03-23 DIAGNOSIS — M791 Myalgia, unspecified site: Secondary | ICD-10-CM

## 2016-03-23 DIAGNOSIS — M533 Sacrococcygeal disorders, not elsewhere classified: Secondary | ICD-10-CM

## 2016-03-23 NOTE — Patient Instructions (Signed)
childs pose rocking  10 x reps with stretching  Dolphin plank on knees (clasped hands, forearm stabilized with tuckunder toes)  Inhale back, buttock over heels,  Exhale deep muscles engaged, shoulders over elbows   10 x 3 reps   Pelvic tilt in table top  With breath     Bridging 10 x 3 reps

## 2016-03-24 NOTE — Therapy (Addendum)
[place] MAIN [facility] SERVICES [address] [place], [place], [address] Phone: [phone]   Fax:  [phone]  Physical Therapy Treatment  Patient Details  Name: Kelly Pittman MRN: [id number] Date of Birth: [birth year] Referring Provider: Ammie Dalton  Encounter Date: 03/23/2016      PT End of Session - 03/24/16 1847    Visit Number 4   Number of Visits 12   Date for PT Re-Evaluation 05/17/16   PT Start Time 4034   PT Stop Time 1530   PT Time Calculation (min) 52 min   Activity Tolerance Patient tolerated treatment well;No increased pain   Behavior During Therapy [facility] for tasks assessed/performed      Past Medical History  Diagnosis Date  . Hypertension   . Anxiety     Past Surgical History  Procedure Laterality Date  . Abdominal hysterectomy    . Unilateral salpingectomy      left  . Carpal tunnel release      There were no vitals filed for this visit.      Subjective Assessment - 03/23/16 1448    Subjective Pt reported she felt better wtih dull pain 2/10 for 4 days. Pt did not use her heating pad. Pt has been performing her stretches   Patient Stated Goals To not have pelvic pain             [facility] PT Assessment - 03/24/16 1845    Palpation   Palpation comment moderately decreased fascial restrictions along adductor magnus and L suprapubic area but still tender                     [facility] Adult PT Treatment/Exercise - 03/24/16 1846    Therapeutic Activites    Other Therapeutic Activities see pt instructions   Manual Therapy   Manual therapy comments manual lymph drainage at abdomen, inguinal B. Fascial releases over problem areas dscribed under assessments  guided pt for self-lymph massage                PT Education - 03/24/16 1846    Education provided Yes   Education Details HEP, avoid leg lift exercise and explained how it impacts the problem area, modification for core   Person(s)  Educated Patient   Methods Demonstration;Explanation;Tactile cues;Verbal cues;Handout   Comprehension Returned demonstration;Verbalized understanding             PT Long Term Goals - 03/24/16 1850    PT LONG TERM GOAL #1   Title Pt will decrease her score on [place] from 39% to < 29% in order particiapte in community and social events.    Time 12   Period Weeks   Status On-going   PT LONG TERM GOAL #2   Title Pt will decrease her score on LEFS from 66% to < 50% in order to improve balance and minimize future risk for injuries.   Time 12   Period Weeks   Status On-going   PT LONG TERM GOAL #3   Title Pt will demo no asymmetries in her sacroiliac joint across two visits in order to minimize pelvic pain and foot pain.    Time 12   Period Weeks   Status Achieved   PT LONG TERM GOAL #4   Title Pt will demo increased ankle strength (eversion/inversion/ plantarflexion ) to 4/5 bilaterally in order to walk longer distances   Time 12   Period Weeks   Status Partially Met  PT LONG TERM GOAL #5   Title Pt will increase her walking speed from 1.05 m/s to >1.2 m/s in order to cross the street safely.   Time 12   Period Weeks   Status Partially Met               Plan - 03/24/16 1848    Clinical Impression Statement Pt continues to show decreased fascial restrictions along L adductor magnus and suprapubic area compared to last session. Educated pt to discontinue her leg lift exercises and provided pt other modifications for deep core strengthening .     Rehab Potential Good   PT Frequency 1x / week   PT Duration 12 weeks   PT Treatment/Interventions Aquatic Therapy;ADLs/Self Care Home Management;Gait training;Moist Heat;Traction;Stair training;Functional mobility training;Therapeutic activities;Therapeutic exercise;Balance training;Neuromuscular re-education;Manual techniques;Patient/family education;Passive range of motion;Energy conservation;Dry needling;Taping      Patient  will benefit from skilled therapeutic intervention in order to improve the following deficits and impairments:  Abnormal gait, Improper body mechanics, Pain, Postural dysfunction, Increased muscle spasms, Decreased mobility, Decreased coordination, Decreased activity tolerance, Decreased endurance, Decreased range of motion, Hypomobility, Decreased strength, Decreased balance, Decreased safety awareness, Difficulty walking, Impaired flexibility  Visit Diagnosis: Sacrococcygeal disorders, not elsewhere classified  Difficulty in walking, not elsewhere classified  Myalgia  Muscle weakness (generalized)      Problem List Patient Active Problem List   Diagnosis Date Noted  . Chronic pelvic pain in female 03/27/2014  . Obesity 03/27/2014    [name] ,PT, DPT, E-RYT  03/24/2016, 6:51 PM  [place] MAIN [facility] SERVICES [address] [place], [place], [address] Phone: [phone]   Fax:  [phone]  Name: Kelly Pittman MRN: [id number] Date of Birth: [birth year]

## 2016-03-28 ENCOUNTER — Ambulatory Visit: Payer: BLUE CROSS/BLUE SHIELD | Admitting: Physical Therapy

## 2016-03-28 DIAGNOSIS — R262 Difficulty in walking, not elsewhere classified: Secondary | ICD-10-CM | POA: Diagnosis not present

## 2016-03-28 DIAGNOSIS — M533 Sacrococcygeal disorders, not elsewhere classified: Secondary | ICD-10-CM

## 2016-03-28 DIAGNOSIS — M6281 Muscle weakness (generalized): Secondary | ICD-10-CM

## 2016-03-28 DIAGNOSIS — M791 Myalgia, unspecified site: Secondary | ICD-10-CM

## 2016-03-28 NOTE — Patient Instructions (Addendum)
You are now ready to begin training the deep core muscles system: diaphragm, transverse abdominis, pelvic floor . These muscles must work together as a team.           The key to these exercises to train the brain to coordinate the timing of these muscles and to have them turn on for long periods of time to hold you upright against gravity (especially important if you are on your feet all day).These muscles are postural muscles and play a role stabilizing your spine and bodyweight. By doing these repetitions slowly and correctly instead of doing crunches, you will achieve a flatter belly without a lower pooch. You are also placing your spine in a more neutral position and breathing properly which in turn, decreases your risk for problems related to your pelvic floor, abdominal, and low back such as pelvic organ prolapse, hernias, diastasis recti (separation of superficial muscles), disk herniations, spinal fractures. These exercises set a solid foundation for you to later progress to resistance/ strength training with therabands and weights and return to other typical fitness exercises with a stronger deeper core.  Level 2 30 reps x 2 x day   And body scan 3 cycles (daily)

## 2016-03-29 NOTE — Therapy (Addendum)
[place] MAIN [facility] SERVICES [address] [place], [place], [address] Phone: [phone]   Fax:  [phone]  Physical Therapy Treatment  Patient Details  Name: Kelly Pittman MRN: [id number] Date of Birth: April 23, 1966 Referring Provider: Ammie Dalton  Encounter Date: 03/28/2016      PT End of Session - 03/29/16 2306    Visit Number 5   Number of Visits 12   Date for PT Re-Evaluation 05/17/16   PT Start Time 7741   PT Stop Time 1520   PT Time Calculation (min) 45 min   Activity Tolerance Patient tolerated treatment well;No increased pain   Behavior During Therapy [facility] for tasks assessed/performed      Past Medical History  Diagnosis Date  . Hypertension   . Anxiety     Past Surgical History  Procedure Laterality Date  . Abdominal hysterectomy    . Unilateral salpingectomy      left  . Carpal tunnel release      There were no vitals filed for this visit.      Subjective Assessment - 03/29/16 2309    Subjective Pt reported her pelvic pain has increased the day after last visit which eased with a heating pad. The following day, pt walked alot with a dull minimal pain. The day after walking, the pain increased to a 7/10 pain without relief. Pt reports she will be getting a urine test today after appt to see if she has a bladder infection. Pt noticed her urine has been yellow despite drinking water and she has also felt a  pressure feeling.  Foot pain is better.    Patient Stated Goals To not have pelvic pain                          [facility] Adult PT Treatment/Exercise - 03/28/16 1517    Neuro Re-ed    Neuro Re-ed Details  body scan and deep level 2 (20 reps)                      PT Long Term Goals - 03/28/16 1452    PT LONG TERM GOAL #1   Title Pt will decrease her score on [place] from 39% to < 29% in order particiapte in community and social events.    Time 12   Period Weeks   Status On-going    PT LONG TERM GOAL #2   Title Pt will decrease her score on LEFS from 66% to < 50% in order to improve balance and minimize future risk for injuries.   Time 12   Period Weeks   Status On-going   PT LONG TERM GOAL #3   Title Pt will demo no asymmetries in her sacroiliac joint across two visits in order to minimize pelvic pain and foot pain.    Time 12   Period Weeks   Status Achieved   PT LONG TERM GOAL #4   Title Pt will demo increased ankle strength (eversion/inversion/ plantarflexion ) to 4/5 bilaterally in order to walk longer distances   Time 12   Period Weeks   Status Partially Met   PT LONG TERM GOAL #5   Title Pt will increase her walking speed from 1.05 m/s to >1.2 m/s in order to cross the street safely.   Time 12   Period Weeks   Status Partially Met  Plan - 03/28/16 1516    Clinical Impression Statement Pt reported her pain decreased from 7/10 to 4/10 (duller) after deep core mm strengthening today. Advised pt to avoid her self-selected abdominal exercises. Recommended pt to practice deep core exercises to avoid strain on abdominal/ pelvic floor w/ her improper technique.  Internal pelvic assessment and manual treatments were withheld due to pt suspecting an infection. Pt plans to get tested for a UTI after today session. Await for her results and see if possible infection may have been playing a role on increased pain. Pt will continue to benefit from skilled PT.     Rehab Potential Good   PT Frequency 1x / week   PT Duration 12 weeks   PT Treatment/Interventions Aquatic Therapy;ADLs/Self Care Home Management;Gait training;Moist Heat;Traction;Stair training;Functional mobility training;Therapeutic activities;Therapeutic exercise;Balance training;Neuromuscular re-education;Manual techniques;Patient/family education;Passive range of motion;Energy conservation;Dry needling;Taping      Patient will benefit from skilled therapeutic intervention in order to  improve the following deficits and impairments:  Abnormal gait, Improper body mechanics, Pain, Postural dysfunction, Increased muscle spasms, Decreased mobility, Decreased coordination, Decreased activity tolerance, Decreased endurance, Decreased range of motion, Hypomobility, Decreased strength, Decreased balance, Decreased safety awareness, Difficulty walking, Impaired flexibility  Visit Diagnosis: Sacrococcygeal disorders, not elsewhere classified  Difficulty in walking, not elsewhere classified  Myalgia  Muscle weakness (generalized)      Problem List Patient Active Problem List   Diagnosis Date Noted  . Chronic pelvic pain in female 03/27/2014  . Obesity 03/27/2014    [name] ,PT, DPT, E-RYT  03/29/2016, 11:09 PM  [place] MAIN [facility] SERVICES [address] [place], [place], [address] Phone: [phone]   Fax:  [phone]  Name: Kelly Pittman MRN: [id number] Date of Birth: [birth year]

## 2016-04-03 ENCOUNTER — Ambulatory Visit: Payer: BLUE CROSS/BLUE SHIELD | Admitting: Physical Therapy

## 2016-04-03 DIAGNOSIS — R262 Difficulty in walking, not elsewhere classified: Secondary | ICD-10-CM

## 2016-04-03 DIAGNOSIS — M791 Myalgia, unspecified site: Secondary | ICD-10-CM

## 2016-04-03 DIAGNOSIS — M533 Sacrococcygeal disorders, not elsewhere classified: Secondary | ICD-10-CM

## 2016-04-03 DIAGNOSIS — M6281 Muscle weakness (generalized): Secondary | ICD-10-CM

## 2016-04-03 NOTE — Patient Instructions (Addendum)
Frog stretch:  On belly:  Knees bent,   Inhale,   Exhale, leg heels drop about 45 deg   Picture and feel pelvic floor stretch    15 rep ____  Walking: thigh high   Standing at work: mini squats to lengthen pelvic floor    _____

## 2016-04-04 NOTE — Therapy (Addendum)
[name] [facility] SERVICES [address] [place], [place], [address] Phone: [phone]   Fax:  [phone]  Physical Therapy Treatment  Patient Details  Name: Kelly Pittman MRN: [id number] Date of Birth: Jun 06, 1966 Referring Provider: Ammie Dalton  Encounter Date: 04/03/2016      PT End of Session - 04/04/16 2225    Visit Number 6   Number of Visits 12   Date for PT Re-Evaluation 05/17/16   PT Start Time 1505   PT Stop Time 1610   PT Time Calculation (min) 65 min   Activity Tolerance Patient tolerated treatment well;No increased pain   Behavior During Therapy [facility] for tasks assessed/performed      Past Medical History  Diagnosis Date  . Hypertension   . Anxiety     Past Surgical History  Procedure Laterality Date  . Abdominal hysterectomy    . Unilateral salpingectomy      left  . Carpal tunnel release      There were no vitals filed for this visit.      Subjective Assessment - 04/03/16 1511    Subjective Pt reported her pelvic pain has remained 5/10 since last session. Pt's lab work was negative for UTI.  Pt reported feeling pain with bowel movements like a "stomach cramp"    Patient Stated Goals To not have pelvic pain             [facility] PT Assessment - 04/04/16 2222    Palpation   SI assessment  symmetrical SIJ, increased tensions at L coccygeus    Ambulation/Gait   Gait Pattern --  pre tx: shorter strides, decreased hip flex,post Tx improved                  Pelvic Floor Special Questions - 04/04/16 2222    Pelvic Floor Internal Exam pt consented verbally and had no contraindications   Exam Type Vaginal   Palpation no tensions on R,  tenderness and tensions at L coccygeus lateral to sacrum (decreased postTx)            [facility] Adult PT Treatment/Exercise - 04/04/16 2222    Neuro Re-ed    Neuro Re-ed Details  see pt instructions   Manual Therapy   Internal Pelvic Floor L coccygeus  (sustained pressure, MET internally and externally)                 PT Education - 04/04/16 2224    Education provided Yes   Education Details HEP   Person(s) Educated Patient   Methods Explanation;Demonstration;Tactile cues;Verbal cues;Handout   Comprehension Returned demonstration;Verbalized understanding             PT Long Term Goals - 03/28/16 1452    PT LONG TERM GOAL #1   Title Pt will decrease her score on [place] from 39% to < 29% in order particiapte in community and social events.    Time 12   Period Weeks   Status On-going   PT LONG TERM GOAL #2   Title Pt will decrease her score on LEFS from 66% to < 50% in order to improve balance and minimize future risk for injuries.   Time 12   Period Weeks   Status On-going   PT LONG TERM GOAL #3   Title Pt will demo no asymmetries in her sacroiliac joint across two visits in order to minimize pelvic pain and foot pain.    Time 12   Period Weeks  Status Achieved   PT LONG TERM GOAL #4   Title Pt will demo increased ankle strength (eversion/inversion/ plantarflexion ) to 4/5 bilaterally in order to walk longer distances   Time 12   Period Weeks   Status Partially Met   PT LONG TERM GOAL #5   Title Pt will increase her walking speed from 1.05 m/s to >1.2 m/s in order to cross the street safely.   Time 12   Period Weeks   Status Partially Met               Plan - 04/04/16 2225    Clinical Impression Statement Pt showed significantly decreased mm tensions/tenderness on L coccygeus post Tx and demonstrated increased stride length. Pt reported having relief after Tx and had no pain with hip flexion during gait. Pt was educated on proper toileting mechanics to decrease pain with bowel movements. Anticipate today's treatments will help pt 's pelvic pain.  Pt will continue to benefit from skilled PT  to address her goals.    Rehab Potential Good   PT Frequency 1x / week   PT Duration 12 weeks   PT  Treatment/Interventions Aquatic Therapy;ADLs/Self Care Home Management;Gait training;Moist Heat;Traction;Stair training;Functional mobility training;Therapeutic activities;Therapeutic exercise;Balance training;Neuromuscular re-education;Manual techniques;Patient/family education;Passive range of motion;Energy conservation;Dry needling;Taping      Patient will benefit from skilled therapeutic intervention in order to improve the following deficits and impairments:  Abnormal gait, Improper body mechanics, Pain, Postural dysfunction, Increased muscle spasms, Decreased mobility, Decreased coordination, Decreased activity tolerance, Decreased endurance, Decreased range of motion, Hypomobility, Decreased strength, Decreased balance, Decreased safety awareness, Difficulty walking, Impaired flexibility  Visit Diagnosis: Sacrococcygeal disorders, not elsewhere classified  Difficulty in walking, not elsewhere classified  Myalgia  Muscle weakness (generalized)     Problem List Patient Active Problem List   Diagnosis Date Noted  . Chronic pelvic pain in female 03/27/2014  . Obesity 03/27/2014    Kelly Pittman ,PT, DPT, E-RYT  04/04/2016, 10:38 PM  [place] MAIN [facility] SERVICES [address] [place], [place], [address] Phone: [phone]   Fax:  [phone]  Name: Kelly Pittman MRN: [id number] Date of Birth: Jun 21, 1966

## 2016-04-11 ENCOUNTER — Ambulatory Visit: Payer: BLUE CROSS/BLUE SHIELD | Admitting: Physical Therapy

## 2016-04-11 DIAGNOSIS — M533 Sacrococcygeal disorders, not elsewhere classified: Secondary | ICD-10-CM

## 2016-04-11 DIAGNOSIS — M6281 Muscle weakness (generalized): Secondary | ICD-10-CM

## 2016-04-11 DIAGNOSIS — M791 Myalgia, unspecified site: Secondary | ICD-10-CM

## 2016-04-11 DIAGNOSIS — R262 Difficulty in walking, not elsewhere classified: Secondary | ICD-10-CM

## 2016-04-12 NOTE — Patient Instructions (Signed)
Deep core level 2   Pelvic floor movement with breathing (deep core level 1)

## 2016-04-12 NOTE — Therapy (Addendum)
[place] MAIN [facility] SERVICES [address] [place], [place], [address] Phone: [phone]   Fax:  [phone]  Physical Therapy Treatment  Patient Details  Name: Kelly Pittman MRN: [id number] Date of Birth: Mar 16, 1966 Referring Provider: Ammie Dalton  Encounter Date: 04/11/2016      PT End of Session - 04/12/16 1429    Visit Number 7   Number of Visits 12   Date for PT Re-Evaluation 05/17/16   PT Start Time 6389   PT Stop Time 1805   PT Time Calculation (min) 35 min   Activity Tolerance Patient tolerated treatment well;No increased pain   Behavior During Therapy [facility] for tasks assessed/performed      Past Medical History  Diagnosis Date  . Hypertension   . Anxiety     Past Surgical History  Procedure Laterality Date  . Abdominal hysterectomy    . Unilateral salpingectomy      left  . Carpal tunnel release      There were no vitals filed for this visit.      Subjective Assessment - 04/11/16 1729    Subjective Pt reported her pelvic pain has decreased from 5-6/10 to a 3/10. since last session. Pt does not have pain withbowel movements. Pt reported slight tightness in her R ankle             [facility] PT Assessment - 04/12/16 1428    Palpation   Palpation comment slight fascial tensions over suprapubic area L                   Pelvic Floor Special Questions - 04/12/16 1426    Pelvic Floor Internal Exam pt consented verbally and had no contraindications   Exam Type Vaginal   Palpation no tensionno tensions/ tenderness noted            [facility] Adult PT Treatment/Exercise - 04/12/16 1424    Neuro Re-ed    Neuro Re-ed Details  see pt intructions   Manual Therapy   Manual therapy comments suprapubic area L , myofascial release, and STM along lateral malleoli area of R ankle    Internal Pelvic Floor reassessment                 PT Education - 04/12/16 1426    Education provided Yes   Education  Details HEP   Person(s) Educated Patient   Methods Explanation;Demonstration;Tactile cues;Verbal cues;Handout   Comprehension Verbalized understanding;Returned demonstration             PT Long Term Goals - 03/28/16 1452    PT LONG TERM GOAL #1   Title Pt will decrease her score on [place] from 39% to < 29% in order particiapte in community and social events.    Time 12   Period Weeks   Status On-going   PT LONG TERM GOAL #2   Title Pt will decrease her score on LEFS from 66% to < 50% in order to improve balance and minimize future risk for injuries.   Time 12   Period Weeks   Status On-going   PT LONG TERM GOAL #3   Title Pt will demo no asymmetries in her sacroiliac joint across two visits in order to minimize pelvic pain and foot pain.    Time 12   Period Weeks   Status Achieved   PT LONG TERM GOAL #4   Title Pt will demo increased ankle strength (eversion/inversion/ plantarflexion ) to 4/5 bilaterally in  order to walk longer distances   Time 12   Period Weeks   Status Partially Met   PT LONG TERM GOAL #5   Title Pt will increase her walking speed from 1.05 m/s to >1.2 m/s in order to cross the street safely.   Time 12   Period Weeks   Status Partially Met               Plan - 04/12/16 1429    Clinical Impression Statement Pt continues to report no pelvic pain and showed no pelvic floor tensions / tenderness today and was able to elicit proper pelvic floor ROM with coordinated breathing. Pt also reported she has had no pain with bowel movements.  Pt continues to show normal ROM at her ankle but required STM to release tensions at the lateral side of R ankle. Pt continues to progress well towards her goals.    Rehab Potential Good   PT Frequency 1x / week   PT Duration 12 weeks   PT Treatment/Interventions Aquatic Therapy;ADLs/Self Care Home Management;Gait training;Moist Heat;Traction;Stair training;Functional mobility training;Therapeutic activities;Therapeutic  exercise;Balance training;Neuromuscular re-education;Manual techniques;Patient/family education;Passive range of motion;Energy conservation;Dry needling;Taping      Patient will benefit from skilled therapeutic intervention in order to improve the following deficits and impairments:  Abnormal gait, Improper body mechanics, Pain, Postural dysfunction, Increased muscle spasms, Decreased mobility, Decreased coordination, Decreased activity tolerance, Decreased range of motion, Hypomobility, Decreased strength, Decreased balance, Decreased safety awareness, Difficulty walking, Impaired flexibility, Decreased endurance  Visit Diagnosis: Sacrococcygeal disorders, not elsewhere classified  Difficulty in walking, not elsewhere classified  Myalgia  Muscle weakness (generalized)      Problem List Patient Active Problem List   Diagnosis Date Noted  . Chronic pelvic pain in female 03/27/2014  . Obesity 03/27/2014    [name] ,PT, DPT, E-RYT  04/12/2016, 2:31 PM  [place] MAIN [facility] SERVICES [address] [place], [place], [address] Phone: [phone]   Fax:  [phone]  Name: Antasia Haider MRN: [id number] Date of Birth: 04-30-1966

## 2016-05-18 ENCOUNTER — Encounter: Payer: BLUE CROSS/BLUE SHIELD | Admitting: Physical Therapy

## 2016-05-23 ENCOUNTER — Ambulatory Visit: Payer: BLUE CROSS/BLUE SHIELD | Attending: Unknown Physician Specialty | Admitting: Physical Therapy

## 2016-05-23 DIAGNOSIS — M791 Myalgia, unspecified site: Secondary | ICD-10-CM

## 2016-05-23 DIAGNOSIS — M6281 Muscle weakness (generalized): Secondary | ICD-10-CM | POA: Insufficient documentation

## 2016-05-23 DIAGNOSIS — M533 Sacrococcygeal disorders, not elsewhere classified: Secondary | ICD-10-CM | POA: Insufficient documentation

## 2016-05-23 DIAGNOSIS — R262 Difficulty in walking, not elsewhere classified: Secondary | ICD-10-CM | POA: Diagnosis present

## 2016-05-23 NOTE — Therapy (Addendum)
[place] MAIN [facility] SERVICES [address] [place], [place], [address] Phone: [phone]   Fax:  [phone]  Physical Therapy Treatment / Progress Note  Patient Details  Name: Jamison Yuhasz MRN: [id number] Date of Birth: [birth year] Referring Provider: Ammie Dalton  Encounter Date: 05/23/2016      PT End of Session - 05/23/16 1738    Visit Number 8   Number of Visits 12   Date for PT Re-Evaluation 05/17/16   PT Start Time 1703   PT Stop Time 8115   PT Time Calculation (min) 42 min   Activity Tolerance Patient tolerated treatment well;No increased pain   Behavior During Therapy [facility] for tasks assessed/performed      Past Medical History  Diagnosis Date  . Hypertension   . Anxiety     Past Surgical History  Procedure Laterality Date  . Abdominal hysterectomy    . Unilateral salpingectomy      left  . Carpal tunnel release      There were no vitals filed for this visit.          [facility] PT Assessment - 05/23/16 1734    Palpation   Palpation comment increased mm tensions along posterior lateral edge of L foot (decreased post Tx)                      [facility] Adult PT Treatment/Exercise - 05/23/16 1735    Exercises   Exercises --  achilles glide in gastroc stretch position, heel raises 15 x   Manual Therapy   Internal Pelvic Floor STM along problem area  and PA/AP mobs along 5th metatarsal                      PT Long Term Goals - 05/23/16 1711    PT LONG TERM GOAL #1   Title Pt will decrease her score on [place] from 39% to < 29% in order particiapte in community and social events.  (7/11: 31%)   Time 12   Period Weeks   Status Achieved   PT LONG TERM GOAL #2   Title Pt will decrease her score on LEFS from 66% to < 50% in order to improve balance and minimize future risk for injuries. (7/11: 50%    Time 12   Period Weeks   Status Achieved   PT LONG TERM GOAL #3   Title Pt will demo no  asymmetries in her sacroiliac joint across two visits in order to minimize pelvic pain and foot pain.    Time 12   Period Weeks   Status Achieved   PT LONG TERM GOAL #4   Title Pt will demo increased ankle strength (eversion/inversion/ plantarflexion ) to 4/5 bilaterally in order to walk longer distances   Time 12   Period Weeks   Status Partially Met   PT LONG TERM GOAL #5   Title Pt will increase her walking speed from 1.05 m/s to >1.2 m/s in order to cross the street safely. (7/11: 1.47 m/s)    Time 12   Period Weeks   Status Achieved   Additional Long Term Goals   Additional Long Term Goals Yes   PT LONG TERM GOAL #6   Title Pt will report being able to walk 3 miles with minimal pain < 2/10 in order to return to fitness.   Time 12   Period Weeks   Status New  Plan - 05/23/16 1739    Clinical Impression Statement Pt has achieved 4/6 goals and is progressing well towards her remaining goals. Pt's Pain Disability and LE functional Scores decreased. Her pelvic alignment has remained symmetrical, her pelvic floor tensions decreased, and she has regained full ROM in her R foot/ankle. However, pt continues to benefit from skilled PT in order to regain endurance with walking and to progress with strengthening exercises.     Rehab Potential Good   PT Frequency 1x / week   PT Duration 12 weeks   PT Treatment/Interventions Aquatic Therapy;ADLs/Self Care Home Management;Gait training;Moist Heat;Traction;Stair training;Functional mobility training;Therapeutic activities;Therapeutic exercise;Balance training;Neuromuscular re-education;Manual techniques;Patient/family education;Passive range of motion;Energy conservation;Dry needling;Taping      Patient will benefit from skilled therapeutic intervention in order to improve the following deficits and impairments:  Abnormal gait, Improper body mechanics, Pain, Postural dysfunction, Increased muscle spasms, Decreased mobility,  Decreased coordination, Decreased activity tolerance, Decreased range of motion, Hypomobility, Decreased strength, Decreased balance, Decreased safety awareness, Difficulty walking, Impaired flexibility, Decreased endurance  Visit Diagnosis: Difficulty in walking, not elsewhere classified  Sacrococcygeal disorders, not elsewhere classified  Myalgia  Muscle weakness (generalized)     Problem List Patient Active Problem List   Diagnosis Date Noted  . Chronic pelvic pain in female 03/27/2014  . Obesity 03/27/2014    [name] ,PT, DPT, E-RYT  05/23/2016, 6:00 PM  [place] MAIN [facility] SERVICES [address] [place], [place], [address] Phone: [phone]   Fax:  [phone]  Name: Kimmora Risenhoover MRN: [id number] Date of Birth: 1966/01/31

## 2016-05-23 NOTE — Patient Instructions (Addendum)
Handout on work Dispensing optician glide in gastroc stretch position 10 x      heel raises 15 x

## 2016-05-30 ENCOUNTER — Ambulatory Visit: Payer: BLUE CROSS/BLUE SHIELD | Admitting: Physical Therapy

## 2016-05-30 DIAGNOSIS — R262 Difficulty in walking, not elsewhere classified: Secondary | ICD-10-CM

## 2016-05-30 DIAGNOSIS — M533 Sacrococcygeal disorders, not elsewhere classified: Secondary | ICD-10-CM | POA: Diagnosis not present

## 2016-05-30 DIAGNOSIS — M791 Myalgia, unspecified site: Secondary | ICD-10-CM

## 2016-05-30 DIAGNOSIS — M6281 Muscle weakness (generalized): Secondary | ICD-10-CM

## 2016-05-30 NOTE — Therapy (Signed)
[place] MAIN [facility] SERVICES [address] [place], [place], [address] Phone: [phone]   Fax:  [phone]  Physical Therapy Treatment  Patient Details  Name: Kelly Pittman MRN: [id number] Date of Birth: 1966/09/04 Referring Provider: Ammie Dalton  Encounter Date: 05/30/2016      PT End of Session - 05/30/16 1758    Visit Number 9   Number of Visits 12   Date for PT Re-Evaluation 05/17/16   PT Start Time 4827   PT Stop Time 1758   PT Time Calculation (min) 48 min   Activity Tolerance Patient tolerated treatment well;No increased pain   Behavior During Therapy [facility] for tasks assessed/performed      Past Medical History  Diagnosis Date  . Hypertension   . Anxiety     Past Surgical History  Procedure Laterality Date  . Abdominal hysterectomy    . Unilateral salpingectomy      left  . Carpal tunnel release      There were no vitals filed for this visit.      Subjective Assessment - 05/30/16 1712    Subjective Pt reported her R ankle pain has been bothering her at the achilles region. Pelvic pain has resolved.             [facility] PT Assessment - 05/30/16 1737    PROM   Overall PROM Comments limited R DF in prone , knee flexion (post Tx, PROM increased)     Palpation   Palpation comment increased tesnsions along navicular and achilles on R (Post Tx decreased)                      [facility] Adult PT Treatment/Exercise - 05/30/16 1738    Exercises   Exercises --  see pt instructions   Manual Therapy   Internal Pelvic Floor distraction mob on talus/ calcaneus grade III 90 sec in prone knee flexion, STM along achilles                PT Education - 05/30/16 1759    Education provided Yes   Education Details HEP, gait mechanics, stretches   Person(s) Educated Patient   Methods Explanation;Demonstration;Tactile cues;Verbal cues;Handout   Comprehension Verbalized understanding;Returned  demonstration;Verbal cues required;Tactile cues required             PT Long Term Goals - 05/30/16 1759    PT LONG TERM GOAL #1   Title Pt will decrease her score on [place] from 39% to < 29% in order particiapte in community and social events.  (7/11: 31%)   Time 12   Period Weeks   Status Achieved   PT LONG TERM GOAL #2   Title Pt will decrease her score on LEFS from 66% to < 50% in order to improve balance and minimize future risk for injuries. (7/11: 50%    Time 12   Period Days   Status Achieved   PT LONG TERM GOAL #3   Title Pt will demo no asymmetries in her sacroiliac joint across two visits in order to minimize pelvic pain and foot pain.    Time 12   Period Weeks   Status Achieved   PT LONG TERM GOAL #4   Title Pt will demo increased ankle strength (eversion/inversion/ plantarflexion ) to 4/5 bilaterally in order to walk longer distances   Time 12   Period Weeks   Status Partially Met   PT LONG TERM GOAL #5  Title Pt will increase her walking speed from 1.05 m/s to >1.2 m/s in order to cross the street safely. (7/11: 1.47 m/s)    Time 12   Period Weeks   Status Achieved   PT LONG TERM GOAL #6   Title Pt will report being able to walk 3 miles with minimal pain < 2/10 in order to return to fitness.   Time 12   Period Weeks   Status Partially Met               Plan - 05/30/16 1759    Clinical Impression Statement Pt demo'd increased DF on R ankle and progressed to mini heel raises. Pt demo'd improved gait mechanics with more COM across mid/forefoot instead of her heels. Pt is progressing well with her goals.    Rehab Potential Good   PT Frequency 1x / week   PT Duration 12 weeks   PT Treatment/Interventions Aquatic Therapy;ADLs/Self Care Home Management;Gait training;Moist Heat;Traction;Stair training;Functional mobility training;Therapeutic activities;Therapeutic exercise;Balance training;Neuromuscular re-education;Manual techniques;Patient/family  education;Passive range of motion;Energy conservation;Dry needling;Taping   Consulted and Agree with Plan of Care Patient      Patient will benefit from skilled therapeutic intervention in order to improve the following deficits and impairments:  Abnormal gait, Improper body mechanics, Pain, Postural dysfunction, Increased muscle spasms, Decreased mobility, Decreased coordination, Decreased activity tolerance, Decreased range of motion, Hypomobility, Decreased strength, Decreased balance, Decreased safety awareness, Difficulty walking, Impaired flexibility, Decreased endurance  Visit Diagnosis: Difficulty in walking, not elsewhere classified  Sacrococcygeal disorders, not elsewhere classified  Myalgia  Muscle weakness (generalized)     Problem List Patient Active Problem List   Diagnosis Date Noted  . Chronic pelvic pain in female 03/27/2014  . Obesity 03/27/2014    [name] ,PT, DPT, E-RYT  05/30/2016, 6:01 PM  [place] MAIN [facility] SERVICES [address] [place], [place], [address] Phone: [phone]   Fax:  [phone]  Name: Kelly Pittman MRN: [id number] Date of Birth: 11-04-1966

## 2016-05-30 NOTE — Patient Instructions (Signed)
Foot massage:  (video)   Fingers in toes: stabilize midfoot, while moving interlaced hand to make toes go up and down  Massage with thumbs along longitidinal arch   Pulling ankle   Massaging along achilles   ______________   Walking on mid/forefoot and less on heels, center of mass (behind belly button) is a little more forward  ______________  Dolphin plank on knees : make sure your toes are tucked for a stretch in achilles   Mini heel raises 10 x reps x 3 x day   Stretches: Sheet on [place] when laying on your back, opposite knee bent, foot hip width apart 5 breaths x 3   Standing: toes up against a low step  5 breaths x 3

## 2016-06-06 ENCOUNTER — Ambulatory Visit: Payer: BLUE CROSS/BLUE SHIELD | Admitting: Physical Therapy

## 2016-06-06 DIAGNOSIS — R262 Difficulty in walking, not elsewhere classified: Secondary | ICD-10-CM

## 2016-06-06 DIAGNOSIS — M791 Myalgia, unspecified site: Secondary | ICD-10-CM

## 2016-06-06 DIAGNOSIS — M533 Sacrococcygeal disorders, not elsewhere classified: Secondary | ICD-10-CM | POA: Diagnosis not present

## 2016-06-06 DIAGNOSIS — M6281 Muscle weakness (generalized): Secondary | ICD-10-CM

## 2016-06-06 NOTE — Therapy (Signed)
[place] MAIN [facility] SERVICES [address] [place], [place], [address] Phone: [phone]   Fax:  [phone]  Physical Therapy Treatment  Patient Details  Name: Kelly Pittman MRN: [id number] Date of Birth: [birth year] Referring Provider: Ammie Dalton  Encounter Date: 06/06/2016      PT End of Session - 06/06/16 1726    Visit Number 10   Number of Visits 12   PT Start Time 1700   PT Stop Time 1730   PT Time Calculation (min) 30 min   Activity Tolerance Patient tolerated treatment well;No increased pain   Behavior During Therapy [facility] for tasks assessed/performed      Past Medical History:  Diagnosis Date  . Anxiety   . Hypertension     Past Surgical History:  Procedure Laterality Date  . ABDOMINAL HYSTERECTOMY    . CARPAL TUNNEL RELEASE    . UNILATERAL SALPINGECTOMY     left    There were no vitals filed for this visit.      Subjective Assessment - 06/06/16 1700    Subjective Pt feels her foot is better and she notices the tightness is less. Pelvic pain is still resolved            [facility] PT Assessment - 06/06/16 1733      PROM   Overall PROM Comments increasde DF in prone and PF in heel raises      Palpation   Palpation comment slightly increased tensions at achilles R      Ambulation/Gait   Gait Comments good carry over with more anterior COM and fore/midfoot weightbearing                       [facility] Adult PT Treatment/Exercise - 06/06/16 1735      Ambulation/Gait   Gait Comments good carry over with more anterior COM and fore/midfoot weightbearing       Therapeutic Activites    Other Therapeutic Activities resassessed goals and gait      Manual Therapy   Manual therapy comments STM and distraction at talocrucral joint                      PT Long Term Goals - 06/06/16 1703      PT LONG TERM GOAL #1   Title Pt will decrease her score on [place] from 39% to < 29% in order  particiapte in community and social events.  (7/11: 31%)   Time 12   Period Weeks   Status Achieved     PT LONG TERM GOAL #2   Title Pt will decrease her score on LEFS from 66% to < 50% in order to improve balance and minimize future risk for injuries. (7/11: 50%    Time 12   Period Days   Status Achieved     PT LONG TERM GOAL #3   Title Pt will demo no asymmetries in her sacroiliac joint across two visits in order to minimize pelvic pain and foot pain.    Time 12   Period Weeks   Status Achieved     PT LONG TERM GOAL #4   Title Pt will demo increased ankle strength (eversion/inversion/ plantarflexion ) to 4/5 bilaterally in order to walk longer distances   Time 12   Period Weeks   Status Partially Met     PT LONG TERM GOAL #5   Title Pt will increase her walking speed from 1.05  m/s to >1.2 m/s in order to cross the street safely. (7/11: 1.47 m/s)    Time 12   Period Weeks   Status Achieved     PT LONG TERM GOAL #6   Title Pt will report being able to walk 3 miles with minimal pain < 2/10 in order to return to fitness.   Time 12   Period Weeks   Status Unable to assess               Plan - 06/06/16 1726    Clinical Impression Statement Pt has achieved 4/5 goals and has 1 goal that was unable to be reassessed. Pt reported her pelvic pain and R foot pain both have improved " a great deal better" based on the Global Rate of Change Questionnaire. Pelvic pain improvements: pelvic symmetry, decreased fascial and mm tensions, and improved SIJ arthrokinematics. R foot pain improvements:  increased DF ROM,  plantar flexion strength, decreased fascial and mm tensions, gait mechanic with more fore/mid foot weight bearing to maintain increased DF.  Pt has returned to all ADLs related to her pelvis and foot. Pt is ready to be d/c at this time. Thank you for your referral.    Rehab Potential Good   PT Frequency 1x / week   PT Duration 12 weeks   PT Treatment/Interventions Aquatic  Therapy;ADLs/Self Care Home Management;Gait training;Moist Heat;Traction;Stair training;Functional mobility training;Therapeutic activities;Therapeutic exercise;Balance training;Neuromuscular re-education;Manual techniques;Patient/family education;Passive range of motion;Energy conservation;Dry needling;Taping   Consulted and Agree with Plan of Care Patient      Patient will benefit from skilled therapeutic intervention in order to improve the following deficits and impairments:  Abnormal gait, Improper body mechanics, Pain, Postural dysfunction, Increased muscle spasms, Decreased mobility, Decreased coordination, Decreased activity tolerance, Decreased range of motion, Hypomobility, Decreased strength, Decreased balance, Decreased safety awareness, Difficulty walking, Impaired flexibility, Decreased endurance  Visit Diagnosis: Difficulty in walking, not elsewhere classified  Sacrococcygeal disorders, not elsewhere classified  Myalgia  Muscle weakness (generalized)     Problem List Patient Active Problem List   Diagnosis Date Noted  . Chronic pelvic pain in female 03/27/2014  . Obesity 03/27/2014    [name] ,PT, DPT, E-RYT  06/06/2016, 5:36 PM  [place] MAIN [facility] SERVICES [address] [place], [place], [address] Phone: [phone]   Fax:  [phone]  Name: Kelly Pittman MRN: [id number] Date of Birth: 1966-05-25

## 2016-08-28 ENCOUNTER — Ambulatory Visit (INDEPENDENT_AMBULATORY_CARE_PROVIDER_SITE_OTHER): Payer: BLUE CROSS/BLUE SHIELD

## 2016-08-28 ENCOUNTER — Encounter: Payer: Self-pay | Admitting: Podiatry

## 2016-08-28 ENCOUNTER — Other Ambulatory Visit: Payer: Self-pay | Admitting: *Deleted

## 2016-08-28 ENCOUNTER — Ambulatory Visit (INDEPENDENT_AMBULATORY_CARE_PROVIDER_SITE_OTHER): Payer: BLUE CROSS/BLUE SHIELD | Admitting: Podiatry

## 2016-08-28 DIAGNOSIS — M7661 Achilles tendinitis, right leg: Secondary | ICD-10-CM | POA: Diagnosis not present

## 2016-08-28 DIAGNOSIS — M79672 Pain in left foot: Principal | ICD-10-CM

## 2016-08-28 DIAGNOSIS — M722 Plantar fascial fibromatosis: Secondary | ICD-10-CM | POA: Diagnosis not present

## 2016-08-28 DIAGNOSIS — M79671 Pain in right foot: Secondary | ICD-10-CM

## 2016-08-28 NOTE — Progress Notes (Signed)
   Subjective:    Patient ID: Kelly Pittman, female    DOB: 30-Dec-1965, 50 y.o.   MRN: [id number]  HPI she presents today with a 3 year history of pain to her right foot and posterior ankle. She states that she has been seen by Dr. Vickki Muff in the past who has tried to treat her plantar fasciitis and Achilles tendinitis steroids nonsteroidals injectables nonweightbearing and weightbearing casts and boots. She states that she has also been to formal physical therapy and she was doing better however now she is doing worse again. She would like a second opinion. Currently she is not taking any medicines and she denies any trauma that could have initiated this onset.    Review of Systems  All other systems reviewed and are negative.      Objective:   Physical Exam: Vital signs are stable alert and oriented 3. Pulses are strongly palpable. Neurologic sensorium is intact for since was the monofilament deep tendon reflexes are intact muscle strength +5 over 5 dorsiflexion plantar flexors and inverters everters all into the musculature is intact. Orthopedic evaluation demonstrates all joints distal to the angle full range of motion without crepitation. Cutaneous evaluation demonstrates supple well-hydrated cutis no erythema edema cellulitis drainage or odor. She does have pain on palpation medial calcaneal tubercle of the right heel she also has pain on palpation of the posterior tibial tendon as it courses beneath the medial malleolus extending to the navicular tuberosity. She also has pain on palpation of the Achilles which to me demonstrates a small defect in the central cord of the Achilles at the level of the calcaneus. She also has gastroc equinus. No open lesions or wounds are noted. Radiographs taken in the office today 3 views of the right foot demonstrates soft tissue increase in density at the plantar fascia calcaneal insertion site right foot. She also demonstrates soft tissue increase in  density along the posterior tibial tendon course and the Achilles.        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  Plan: This chronic condition is been treated in every aggressive and conservative manner possible. I feel an MRI is necessary to assess for any tears which will need to be surgically corrected. I will follow-up with her once the MRI report has come back. I also requested blood work consisting of an arthritic profile.

## 2016-08-29 ENCOUNTER — Telehealth: Payer: Self-pay | Admitting: *Deleted

## 2016-08-29 NOTE — Telephone Encounter (Addendum)
-----   Message from [place] sent at 08/28/2016  5:00 PM EDT ----- Regarding: MRI Orders are in! And this one is for the actual hospital. Thanks! 09/13/2016-Called for MRI results. 09/14/2016-Left message informing pt the MRI results had been review by Dr. Milinda Pointer and he would discuss with her 09/18/2016 at 4:15pm appt.

## 2016-09-06 ENCOUNTER — Encounter: Payer: Self-pay | Admitting: Podiatry

## 2016-09-06 NOTE — Progress Notes (Signed)
Reviewed Arthritic profile dated 08/30/16 all results were negative. I will follow up with her at next scheduled appointment.  Kelly Pittman, [place]

## 2016-09-11 ENCOUNTER — Ambulatory Visit
Admission: RE | Admit: 2016-09-11 | Discharge: 2016-09-11 | Disposition: A | Discharge status: Home / Self Care | Payer: BLUE CROSS/BLUE SHIELD | Source: Ambulatory Visit | Attending: Podiatry | Admitting: Podiatry

## 2016-09-11 DIAGNOSIS — M722 Plantar fascial fibromatosis: Secondary | ICD-10-CM | POA: Insufficient documentation

## 2016-09-11 DIAGNOSIS — M7751 Other enthesopathy of right foot: Secondary | ICD-10-CM | POA: Diagnosis not present

## 2016-09-11 DIAGNOSIS — S86011A Strain of right Achilles tendon, initial encounter: Secondary | ICD-10-CM | POA: Insufficient documentation

## 2016-09-11 DIAGNOSIS — X58XXXA Exposure to other specified factors, initial encounter: Secondary | ICD-10-CM | POA: Diagnosis not present

## 2016-09-11 DIAGNOSIS — M7661 Achilles tendinitis, right leg: Secondary | ICD-10-CM

## 2016-09-18 ENCOUNTER — Ambulatory Visit (INDEPENDENT_AMBULATORY_CARE_PROVIDER_SITE_OTHER): Payer: BLUE CROSS/BLUE SHIELD | Admitting: Podiatry

## 2016-09-18 DIAGNOSIS — M722 Plantar fascial fibromatosis: Secondary | ICD-10-CM

## 2016-09-18 DIAGNOSIS — M7661 Achilles tendinitis, right leg: Secondary | ICD-10-CM

## 2016-09-18 NOTE — Progress Notes (Signed)
She presents today with continued pain to her right posterior heel and lateral aspect of her right foot. 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  She states that her heel is just as painful as it has been and seems to be getting worse. She notices the swelling seems to be worse. She states that she really doesn't have any calf pain on regular basis other than a sharp shooting pain occasionally when she is trying to rest.  Objective: Vital signs are stable alert and oriented 3. Pulses are strong with palpable. Neurologic services intact. Deep tendon reflexes are intact. She has severe pain on palpation of the Achilles tendon of the right heel. MRI report does state partial tearing at its insertion site. Also demonstrates a Achilles tendinopathy of the right heel. With retrocalcaneal bursitis. She also has a gastroc equinus and based on the MRI a tear of the peroneal longus tendon just distal to the lateral malleolus.  Assessment: Gastroc equinus. Achilles tendinitis. Retrocalcaneal heel spur. Tear of the peroneus longus tendon. Right foot.  Plan: We consented her today for a surgical intervention consisting of a gastrocnemius recession, and Achilles tendon lysis, retrocalcaneal heel spur resection, peroneal longus repair with application of a nonweightbearing cast. I answered all the questions regarding these procedures best of my ability in layman's terms. She understands this and is amenable to it. We did discuss a possible postop palpitations which may include but are not limited to postop pain bleeding swelling infection recurrence and need for further surgery dehiscence of the wound infection of the wound development of a DVT or pulmonary embolism. She understands this is amenable to it and signs are three-page of the consent form.

## 2016-09-18 NOTE — Patient Instructions (Signed)

## 2016-09-19 ENCOUNTER — Telehealth: Payer: Self-pay | Admitting: *Deleted

## 2016-09-19 NOTE — Telephone Encounter (Signed)
"  I am calling again.  I'm getting ready to go back to work.  I was hoping could get a chance to talk to set up my surgery.  Please give me a call.  Hopefully I won't be in with a patient.  Hopefully I can catch you before you leave.  I would like the first available.  Let me know what time you will be there.

## 2016-09-20 NOTE — Telephone Encounter (Signed)
Not able to do it on the 16th.  I will be out that day.  I will see post ops on wed. Morning and I will be out in the pm for my appoinment.  She should continue to be very careful and she will be fine. She could wear her cam walker if she desires and is worried.  If she doesn't have one then we can supply her with one.

## 2016-09-20 NOTE — Telephone Encounter (Signed)
I am returning your call.  You want to schedule surgery with Dr. Milinda Pointer?  "Yes, I would like to do it as soon as possible."  The earliest he has is December 15 or 28.  "He doesn't have anything sooner?  I would hate for my foot to get worse and tear completely.  I am in a lot of pain.  I mind as well wait until after Christmas if I have to wait that long."  I can put you on a waiting list if there is any cancellations.  Dr. Milinda Pointer may be able to do it on November 16 if his schedule changes.  "Can you check with him?  He had mentioned to me the importance of having this taken care of soon.  I would hate for my Achilles to go from under me and I fall and end of having to be in the hospital."  I will send him a message asking him to advise.  I will call you back and give a response.

## 2016-09-21 NOTE — Telephone Encounter (Signed)
"  Did you get in touch with Dr. Milinda Pointer about possibly moving my surgery date up to a sooner date?"  Yes, I did and no earlier date is available.  He suggests that you wear your boot to help protect it until then.  "Is it possible for you to fax me my MRI results?"  No, I cannot fax it due to [place].  "Can you send it to my primary care doctor if they request it?"  Yes, as long as you sign a release from stating it is okay we can send it to them.  "Okay, thank you."

## 2016-10-09 ENCOUNTER — Telehealth: Payer: Self-pay | Admitting: *Deleted

## 2016-10-09 NOTE — Telephone Encounter (Addendum)
I am calling to let you know Dr. Milinda Pointer had a cancellation for December 1.  Would you like to reschedule your surgery from December 28 to December 1?  "Yes, let's do it."  I will get it rescheduled.  "Is there anything I need to do?"  Yes, you will need to register with the surgical center.  Remember not to eat or drink anything after midnight, the night before.  Someone will need to go with you because you will be under anesthesia.  "He mentioned something about a shot that will keep my foot numb for a few days after surgery.  How does that work?  Is it a catheter or something?"  No, it is a shot that will last for a few days. "Okay, thank you."

## 2016-10-10 ENCOUNTER — Telehealth: Payer: Self-pay | Admitting: *Deleted

## 2016-10-10 NOTE — Telephone Encounter (Signed)
"  I'm calling regarding my surgery.  I been thinking about the nerve block.  I read up on it a little.  Will they be injecting that in my vein directly or will they use a catheter that I will have to go back and have them take out?"  They will use put it directly in the vein, no catheter.  "How much will they give me, can I tell them how much I want?"  That's something you will have to discuss with the anesthesiologist.  "I have paperwork I need filled out.  Will I bring that to you or should I take it to the surgery with me and give to the doctor?"  You can take that to the [place] office.

## 2016-10-11 DIAGNOSIS — M79673 Pain in unspecified foot: Secondary | ICD-10-CM

## 2016-10-12 ENCOUNTER — Telehealth: Payer: Self-pay | Admitting: *Deleted

## 2016-10-12 ENCOUNTER — Other Ambulatory Visit: Payer: Self-pay | Admitting: Podiatry

## 2016-10-12 MED ORDER — PROMETHAZINE HCL 25 MG PO TABS: 25.0000 mg | ORAL_TABLET | Freq: Three times a day (TID) | ORAL | 0 refills | Status: DC | PRN

## 2016-10-12 MED ORDER — HYDROMORPHONE HCL 4 MG PO TABS: ORAL_TABLET | ORAL | 0 refills | Status: DC

## 2016-10-12 MED ORDER — CEPHALEXIN 500 MG PO CAPS: 500.0000 mg | ORAL_CAPSULE | Freq: Three times a day (TID) | ORAL | 0 refills | Status: DC

## 2016-10-12 NOTE — Telephone Encounter (Signed)
"  They told me my surgery is scheduled for tomorrow at 2pm.  I have to be there at 12 pm.  Are the instructions the same about nothing to eat or drink after midnight?  That's kind of long.  Call and let me know what to do."  I am returning your call.  On the back of your brochure it says nothing 6 hours prior to surgery.  "Okay, I didn't think I would have to wait that long without eating."

## 2016-10-13 ENCOUNTER — Encounter: Payer: Self-pay | Admitting: Podiatry

## 2016-10-13 DIAGNOSIS — M25871 Other specified joint disorders, right ankle and foot: Secondary | ICD-10-CM | POA: Diagnosis not present

## 2016-10-13 DIAGNOSIS — M25571 Pain in right ankle and joints of right foot: Secondary | ICD-10-CM | POA: Diagnosis not present

## 2016-10-13 DIAGNOSIS — T148XXD Other injury of unspecified body region, subsequent encounter: Secondary | ICD-10-CM | POA: Diagnosis not present

## 2016-10-13 DIAGNOSIS — I10 Essential (primary) hypertension: Secondary | ICD-10-CM | POA: Diagnosis not present

## 2016-10-13 DIAGNOSIS — M7750 Other enthesopathy of unspecified foot: Secondary | ICD-10-CM | POA: Diagnosis not present

## 2016-10-13 DIAGNOSIS — M7731 Calcaneal spur, right foot: Secondary | ICD-10-CM | POA: Diagnosis not present

## 2016-10-13 DIAGNOSIS — S86311A Strain of muscle(s) and tendon(s) of peroneal muscle group at lower leg level, right leg, initial encounter: Secondary | ICD-10-CM | POA: Diagnosis not present

## 2016-10-13 DIAGNOSIS — M216X9 Other acquired deformities of unspecified foot: Secondary | ICD-10-CM | POA: Diagnosis not present

## 2016-10-13 DIAGNOSIS — M7661 Achilles tendinitis, right leg: Secondary | ICD-10-CM | POA: Diagnosis not present

## 2016-10-13 DIAGNOSIS — M216X1 Other acquired deformities of right foot: Secondary | ICD-10-CM | POA: Diagnosis not present

## 2016-10-17 ENCOUNTER — Telehealth: Payer: Self-pay

## 2016-10-17 NOTE — Telephone Encounter (Signed)
Spoke with pt regarding post op status. She stated that she was managing pain well, she denied pain or tenderness in the calf but did state she had pain in her heel at times, but the pain does subside with elevation. She denied any cast tightness at this time, stated toes were pink and warm and she could move them within the cast. Denied fever chill and nausea. She is to continue NWB status and only be up no more that 5-10 min every hour. Any symptoms changes or concerns are to be reported immedialtely

## 2016-10-18 ENCOUNTER — Ambulatory Visit (INDEPENDENT_AMBULATORY_CARE_PROVIDER_SITE_OTHER): Payer: BLUE CROSS/BLUE SHIELD

## 2016-10-18 ENCOUNTER — Ambulatory Visit (INDEPENDENT_AMBULATORY_CARE_PROVIDER_SITE_OTHER): Payer: BLUE CROSS/BLUE SHIELD | Admitting: Podiatry

## 2016-10-18 ENCOUNTER — Encounter: Payer: BLUE CROSS/BLUE SHIELD | Admitting: Podiatry

## 2016-10-18 VITALS — BP 124/73 | HR 58 | Temp 98.3°F | Resp 16

## 2016-10-18 DIAGNOSIS — M79671 Pain in right foot: Secondary | ICD-10-CM | POA: Diagnosis not present

## 2016-10-18 DIAGNOSIS — R52 Pain, unspecified: Secondary | ICD-10-CM

## 2016-10-18 NOTE — Progress Notes (Signed)
She presents today for her first postop visit status post peroneus longus tendon repair right gastroc recession Achilles tendon lysis and a retrocalcaneal heel spur resection with cast. She denies fever chills nausea vomiting muscle aches and pains denies shortness of breath or chest pain. Date of surgery 10/13/2016.  Objective: Vital signs stable she is alert and oriented 3 she presents with her knee scooter today nonweightbearing status cast to the right lower extremity clean and intact. She is good range of motion of her toes the cast is loose around the calf. No signs of infection. Radiographs demonstrate staples and position and a smooth retrocalcaneal surface.  Assessment: Well-healing surgical foot right.  Plan: I encouraged her to continue to stay off of this keep it elevated dry and clean and I will follow-up with her for a cast change in 1 week.Marland Kitchen

## 2016-10-25 ENCOUNTER — Encounter: Payer: BLUE CROSS/BLUE SHIELD | Admitting: Podiatry

## 2016-10-25 ENCOUNTER — Telehealth: Payer: Self-pay | Admitting: Podiatry

## 2016-10-25 NOTE — Telephone Encounter (Signed)
Patient called and left me a message saying that a [employer] is requesting her medical records and she wanted to let us know. Dr. Milinda Pointer performed surgery on her on 13 October 2016. Please call her with any questions at home at [phone].

## 2016-10-26 ENCOUNTER — Telehealth: Payer: Self-pay | Admitting: Podiatry

## 2016-10-26 ENCOUNTER — Telehealth: Payer: Self-pay | Admitting: *Deleted

## 2016-10-26 NOTE — Telephone Encounter (Addendum)
[employer] Kelly Pittman states needs more information concerning Claim# [id number], DOS 10/13/2016 diagnosis and procedures. 11/01/2016-Pt asked if she needed to continue the Ibuprofen. I informed pt that after surgery ibuprofen was for additional pain coverage, and she could stop or use as needed. Pt asked if she still needed to use it for swelling and I told her she could use ice and elevation to see if that helped with any swelling. 11/14/2016-Pt request refill of promethazine. I spoke with pt and she states she has been off of the pain medication for 3 days only taking at night, but has nausea in the morning and sometimes later in the day. I told pt I felt the nausea was related to something else going on and referred her to her PCP, I told her if the nausea was related to the pain medication she would have had it several minutes to hours after taking. Pt states understanding.02/28/2017-Pt requests return to work on Tuesday 03/06/2017 full time.03/01/2017-Left Message informing pt, Dr. Milinda Pointer had okayed her to return to work 03/06/2017, she could pick up a copy of the letter in the [place] office or leave me a message with a fax number and to whose attention to send.

## 2016-10-26 NOTE — Telephone Encounter (Signed)
Called pt on 10/26/2016 about Med[employer] records request from insurance company. They have sent over wrong dates of service. Also called insurance company about the dates and they are going to send over a new request. I just wanted to make you aware of this.

## 2016-10-26 NOTE — Telephone Encounter (Signed)
Ok thank you 

## 2016-10-26 NOTE — Telephone Encounter (Signed)
Kelly Pittman, the lady with [employer] called me back and left me a voicemail this morning at 9:32 am in regards to the records request. They are wanting information from dates 16 December 2015 - 12 Apr 2016. Please call them back at [phone] ext. [phone] (ext is also the case number.) Please make sure to leave your name and the back line number for [employer] to reach you if they need anything else since you are doing the [facility] Records now. Thank you.

## 2016-10-27 NOTE — Progress Notes (Signed)
DOS 12.01.2017 Gastroc Recession Right; Achilles Tendolysis Right; Retrocalcaneal Spur Resection Right Peroneal Longus Tendon Repair Right; Application of Cast

## 2016-10-27 NOTE — Telephone Encounter (Signed)
Thank you :)

## 2016-10-30 ENCOUNTER — Encounter: Payer: Self-pay | Admitting: Podiatry

## 2016-10-30 ENCOUNTER — Ambulatory Visit (INDEPENDENT_AMBULATORY_CARE_PROVIDER_SITE_OTHER): Payer: BLUE CROSS/BLUE SHIELD | Admitting: Podiatry

## 2016-10-30 DIAGNOSIS — M7661 Achilles tendinitis, right leg: Secondary | ICD-10-CM

## 2016-10-30 DIAGNOSIS — Z9889 Other specified postprocedural states: Secondary | ICD-10-CM

## 2016-10-30 DIAGNOSIS — M722 Plantar fascial fibromatosis: Secondary | ICD-10-CM

## 2016-10-30 MED ORDER — PROMETHAZINE HCL 25 MG PO TABS: 25.0000 mg | ORAL_TABLET | Freq: Three times a day (TID) | ORAL | 0 refills | Status: DC | PRN

## 2016-10-30 MED ORDER — HYDROMORPHONE HCL 4 MG PO TABS: ORAL_TABLET | ORAL | 0 refills | Status: DC

## 2016-10-30 NOTE — Progress Notes (Signed)
She presents today little more than 2 weeks status post peroneal tendon repair S [place] recession retrocalcaneal heel spur repair and Achilles tendon lysis on the right foot. She states that she's been doing very well until she got here today or we removed the cast.  Objective: Vital signs are stable she is alert and oriented 3 his has some Pain of the last couple of days though it has resolved she states. She presents with a cast intact in a nonweightbearing status dry and clean. Once removed demonstrates staples are intact are well coapted with no signs of infection or dehiscence. I removed all the staples for her today.  Assessment: Well-healing surgical foot and leg. She is in slight plantarflexion which is tight in her posterior compartment but she understands that she needs to dorsiflex this.  Plan: At this point I placed her in a document and in a cam walker so that she can get some range of motion exercises and go she understands that she is still to be nonweightbearing with no Liberty's. I did re-prescribe promethazine and I wanted.

## 2016-11-14 ENCOUNTER — Other Ambulatory Visit: Payer: Self-pay | Admitting: Podiatry

## 2016-11-22 ENCOUNTER — Ambulatory Visit (INDEPENDENT_AMBULATORY_CARE_PROVIDER_SITE_OTHER): Payer: BLUE CROSS/BLUE SHIELD

## 2016-11-22 ENCOUNTER — Ambulatory Visit (INDEPENDENT_AMBULATORY_CARE_PROVIDER_SITE_OTHER): Payer: BLUE CROSS/BLUE SHIELD | Admitting: Podiatry

## 2016-11-22 ENCOUNTER — Encounter: Payer: Self-pay | Admitting: Podiatry

## 2016-11-22 DIAGNOSIS — M7661 Achilles tendinitis, right leg: Secondary | ICD-10-CM

## 2016-11-22 DIAGNOSIS — M722 Plantar fascial fibromatosis: Secondary | ICD-10-CM

## 2016-11-22 DIAGNOSIS — Z9889 Other specified postprocedural states: Secondary | ICD-10-CM

## 2016-11-22 NOTE — Progress Notes (Signed)
She presents today for postop visit date of surgery 10/13/2016. Status post peroneal tendon repair right gastrocnemius recession and retrocalcaneal heel spur resection with Achilles tendon lysis. She states that she seems to be doing okay continues to wear the Cam Walker utilizing the knee scooter. States that is quite tender over here and she points to the lateral aspect of the right foot inferior to the peroneal tendon incision site. She denies chest pain shortness of breath.  Objective: Cam Walker intact does present utilizing a knee scooter nonweightbearing status vital signs stable alert and oriented 3. Pulses are palpable. She has good range of motion of dorsiflexion plantar flexion inversion and eversion that he eversion against resistance is somewhat tender the peroneal tendon there.  Assessment: Well-healing surgical foot I'm pleased with his progress progress.  Plan: I will allow her to start ambulating with the Cam Walker utilizing a compression anklet. I will follow-up with her in 1-2 weeks. At that time we will proceed to a Tri-Lock brace.

## 2016-11-27 ENCOUNTER — Other Ambulatory Visit: Payer: Self-pay | Admitting: Podiatry

## 2016-12-06 ENCOUNTER — Encounter: Payer: Self-pay | Admitting: Podiatry

## 2016-12-06 ENCOUNTER — Ambulatory Visit (INDEPENDENT_AMBULATORY_CARE_PROVIDER_SITE_OTHER): Payer: BLUE CROSS/BLUE SHIELD | Admitting: Podiatry

## 2016-12-06 ENCOUNTER — Ambulatory Visit (INDEPENDENT_AMBULATORY_CARE_PROVIDER_SITE_OTHER): Payer: BLUE CROSS/BLUE SHIELD

## 2016-12-06 DIAGNOSIS — M7661 Achilles tendinitis, right leg: Secondary | ICD-10-CM

## 2016-12-06 DIAGNOSIS — M722 Plantar fascial fibromatosis: Secondary | ICD-10-CM

## 2016-12-06 DIAGNOSIS — Z9889 Other specified postprocedural states: Secondary | ICD-10-CM

## 2016-12-06 NOTE — Progress Notes (Signed)
She presents today nearly 2 months status post peroneal tendon repair gastroc recession retrocalcaneal heel spur resection and Achilles tendon lysis. She states that she is walking some in her Cam [place] Rile but she is walking slow. She presents today utilizing her knee scooter states that she uses the knee scooter approximately 80% of the time. She denies chest pain shortness of breath Pain.  Objective: Vital signs are stable she is alert and oriented 3 presents in her Cam [place] Rile was removed demonstrate mild edema no erythema cellulitis drainage or odor all incision sites and not only heal uneventfully. She has good strong plantarflexion and dorsiflexion she has tenderness on eversion of the foot and inversion of the foot against resistance.  Assessment: Well-healing surgical foot slightly delayed in ambulation because of excessive pain to the lateral ankle.  Plan: I encouraged her to utilize a Tri-Lock brace instead of the Cam Walker but to use a crutch until she can walk utilizing the Tri-Lock brace in a tennis shoe solely. I am also sending her to physical therapy to increase range of motion and strength and balance.

## 2016-12-14 DIAGNOSIS — S86011D Strain of right Achilles tendon, subsequent encounter: Secondary | ICD-10-CM | POA: Diagnosis not present

## 2016-12-14 DIAGNOSIS — M25571 Pain in right ankle and joints of right foot: Secondary | ICD-10-CM | POA: Diagnosis not present

## 2016-12-19 DIAGNOSIS — M25571 Pain in right ankle and joints of right foot: Secondary | ICD-10-CM | POA: Diagnosis not present

## 2016-12-19 DIAGNOSIS — S86011D Strain of right Achilles tendon, subsequent encounter: Secondary | ICD-10-CM | POA: Diagnosis not present

## 2016-12-21 DIAGNOSIS — S86011D Strain of right Achilles tendon, subsequent encounter: Secondary | ICD-10-CM | POA: Diagnosis not present

## 2016-12-21 DIAGNOSIS — M25571 Pain in right ankle and joints of right foot: Secondary | ICD-10-CM | POA: Diagnosis not present

## 2016-12-25 DIAGNOSIS — S86011D Strain of right Achilles tendon, subsequent encounter: Secondary | ICD-10-CM | POA: Diagnosis not present

## 2016-12-25 DIAGNOSIS — M25571 Pain in right ankle and joints of right foot: Secondary | ICD-10-CM | POA: Diagnosis not present

## 2016-12-27 ENCOUNTER — Ambulatory Visit (INDEPENDENT_AMBULATORY_CARE_PROVIDER_SITE_OTHER): Payer: Self-pay | Admitting: Podiatry

## 2016-12-27 ENCOUNTER — Encounter: Payer: Self-pay | Admitting: Podiatry

## 2016-12-27 ENCOUNTER — Ambulatory Visit (INDEPENDENT_AMBULATORY_CARE_PROVIDER_SITE_OTHER): Payer: BLUE CROSS/BLUE SHIELD

## 2016-12-27 DIAGNOSIS — M722 Plantar fascial fibromatosis: Secondary | ICD-10-CM | POA: Diagnosis not present

## 2016-12-27 DIAGNOSIS — M7661 Achilles tendinitis, right leg: Secondary | ICD-10-CM

## 2016-12-27 DIAGNOSIS — S86011D Strain of right Achilles tendon, subsequent encounter: Secondary | ICD-10-CM | POA: Diagnosis not present

## 2016-12-27 DIAGNOSIS — M25571 Pain in right ankle and joints of right foot: Secondary | ICD-10-CM | POA: Diagnosis not present

## 2016-12-27 MED ORDER — METHYLPREDNISOLONE 4 MG PO TBPK: ORAL_TABLET | ORAL | 0 refills | Status: DC

## 2016-12-27 MED ORDER — DICLOFENAC SODIUM 1 % TD GEL: 4.0000 g | Freq: Four times a day (QID) | TRANSDERMAL | 3 refills | Status: DC

## 2016-12-27 MED ORDER — ZOLPIDEM TARTRATE 5 MG PO TABS: 5.0000 mg | ORAL_TABLET | Freq: Every evening | ORAL | 1 refills | Status: DC | PRN

## 2016-12-27 NOTE — Progress Notes (Signed)
She presents today date of surgery 10/13/2016 repair of Achilles tendon right gastroc recession right peroneal tendon repair right. She states that it is still swollen and still tender.  Objective: Vital signs are stable she is alert and oriented 3. There is no erythema is mild edema no cellulitis drainage or odor she has tenderness on palpation of the peroneal tendons as well as the incision site of the posterior aspect of the leg.  Assessment: Well-healing surgical foot right along slow due to the edema in the right leg.  Plan: Encouraged her to get away from the use of the brace in case this is one of the reason she is swelling. I'm asking her to continue her physical therapy and I will follow-up with her in approximately 2-4 weeks

## 2016-12-29 ENCOUNTER — Encounter: Payer: Self-pay | Admitting: Podiatry

## 2017-01-02 DIAGNOSIS — M25571 Pain in right ankle and joints of right foot: Secondary | ICD-10-CM | POA: Diagnosis not present

## 2017-01-02 DIAGNOSIS — S86011D Strain of right Achilles tendon, subsequent encounter: Secondary | ICD-10-CM | POA: Diagnosis not present

## 2017-01-04 DIAGNOSIS — S86011D Strain of right Achilles tendon, subsequent encounter: Secondary | ICD-10-CM | POA: Diagnosis not present

## 2017-01-04 DIAGNOSIS — M25571 Pain in right ankle and joints of right foot: Secondary | ICD-10-CM | POA: Diagnosis not present

## 2017-01-10 ENCOUNTER — Ambulatory Visit (INDEPENDENT_AMBULATORY_CARE_PROVIDER_SITE_OTHER): Payer: BLUE CROSS/BLUE SHIELD | Admitting: Podiatry

## 2017-01-10 ENCOUNTER — Encounter: Payer: Self-pay | Admitting: Podiatry

## 2017-01-10 ENCOUNTER — Ambulatory Visit (INDEPENDENT_AMBULATORY_CARE_PROVIDER_SITE_OTHER): Payer: BLUE CROSS/BLUE SHIELD

## 2017-01-10 DIAGNOSIS — M7661 Achilles tendinitis, right leg: Secondary | ICD-10-CM

## 2017-01-10 DIAGNOSIS — M25571 Pain in right ankle and joints of right foot: Secondary | ICD-10-CM | POA: Diagnosis not present

## 2017-01-10 DIAGNOSIS — M722 Plantar fascial fibromatosis: Secondary | ICD-10-CM

## 2017-01-10 DIAGNOSIS — S86011D Strain of right Achilles tendon, subsequent encounter: Secondary | ICD-10-CM | POA: Diagnosis not present

## 2017-01-10 MED ORDER — CELECOXIB 200 MG PO CAPS: 200.0000 mg | ORAL_CAPSULE | Freq: Two times a day (BID) | ORAL | 3 refills | Status: DC

## 2017-01-10 NOTE — Progress Notes (Signed)
She presents today for follow-up of her retrocalcaneal heel spur resection gastroc equinus endoscopic fasciotomy and peroneal tendon repair. She states it is still very tender still swells a lot and is just as painful if it hangs down as it from standing on it. She continues her formal physical therapy and is currently wearing a shoe not utilizing crutches nor she utilizing a knee scooter. She presents today with a tennis shoe and a compression anklet.  Objective: Vital signs are stable she is alert and oriented 3 much decrease in edema from previous evaluation. Pulses remain palpable no calf pain. Tenderness on the posterior aspect of the Achilles surgical site but no signs of infection. She does have some tenderness of the peroneal tendons but all of her tendons are 4 out of 5 on strength.  Assessment: Well-healing surgical foot delayed because of the edema.  Plan: Continue formal physical therapy. Stark contrast baths. Start Celebrex 200 mg twice a day for 1 week and then once a day after that. I will follow-up with her in 3 weeks. I will allow her to go back to work half days.

## 2017-01-12 DIAGNOSIS — S86011D Strain of right Achilles tendon, subsequent encounter: Secondary | ICD-10-CM | POA: Diagnosis not present

## 2017-01-12 DIAGNOSIS — M25571 Pain in right ankle and joints of right foot: Secondary | ICD-10-CM | POA: Diagnosis not present

## 2017-01-15 DIAGNOSIS — M25571 Pain in right ankle and joints of right foot: Secondary | ICD-10-CM | POA: Diagnosis not present

## 2017-01-15 DIAGNOSIS — S86011D Strain of right Achilles tendon, subsequent encounter: Secondary | ICD-10-CM | POA: Diagnosis not present

## 2017-01-17 DIAGNOSIS — M25571 Pain in right ankle and joints of right foot: Secondary | ICD-10-CM | POA: Diagnosis not present

## 2017-01-17 DIAGNOSIS — S86011D Strain of right Achilles tendon, subsequent encounter: Secondary | ICD-10-CM | POA: Diagnosis not present

## 2017-01-25 DIAGNOSIS — M25571 Pain in right ankle and joints of right foot: Secondary | ICD-10-CM | POA: Diagnosis not present

## 2017-01-25 DIAGNOSIS — S86011D Strain of right Achilles tendon, subsequent encounter: Secondary | ICD-10-CM | POA: Diagnosis not present

## 2017-01-30 DIAGNOSIS — M25571 Pain in right ankle and joints of right foot: Secondary | ICD-10-CM | POA: Diagnosis not present

## 2017-01-30 DIAGNOSIS — S86011D Strain of right Achilles tendon, subsequent encounter: Secondary | ICD-10-CM | POA: Diagnosis not present

## 2017-01-31 ENCOUNTER — Ambulatory Visit: Payer: BLUE CROSS/BLUE SHIELD | Admitting: Podiatry

## 2017-02-01 DIAGNOSIS — M25571 Pain in right ankle and joints of right foot: Secondary | ICD-10-CM | POA: Diagnosis not present

## 2017-02-01 DIAGNOSIS — S86011D Strain of right Achilles tendon, subsequent encounter: Secondary | ICD-10-CM | POA: Diagnosis not present

## 2017-02-07 ENCOUNTER — Ambulatory Visit (INDEPENDENT_AMBULATORY_CARE_PROVIDER_SITE_OTHER): Payer: BLUE CROSS/BLUE SHIELD | Admitting: Podiatry

## 2017-02-07 ENCOUNTER — Encounter: Payer: Self-pay | Admitting: Podiatry

## 2017-02-07 ENCOUNTER — Ambulatory Visit (INDEPENDENT_AMBULATORY_CARE_PROVIDER_SITE_OTHER): Payer: BLUE CROSS/BLUE SHIELD

## 2017-02-07 DIAGNOSIS — M7661 Achilles tendinitis, right leg: Secondary | ICD-10-CM

## 2017-02-07 DIAGNOSIS — M722 Plantar fascial fibromatosis: Secondary | ICD-10-CM | POA: Diagnosis not present

## 2017-02-07 DIAGNOSIS — S86011D Strain of right Achilles tendon, subsequent encounter: Secondary | ICD-10-CM | POA: Diagnosis not present

## 2017-02-07 DIAGNOSIS — M25571 Pain in right ankle and joints of right foot: Secondary | ICD-10-CM | POA: Diagnosis not present

## 2017-02-08 NOTE — Progress Notes (Signed)
She presents today for follow-up of her gastroc recession Achilles tendon repair retrocalcaneal heel spur resection and a peroneal tendon repair on the right foot and ankle. Date of surgery was 10/13/2016. She states that is finally starting to get better. She's back work working half days. She denies calf pain chest pain shortness of breath. She states that is still swells when it hangs down for a long period and she has to get up and walk around to make it feel better. Her shoe still get tight by the end of the day.  Objective: Vital signs are stable she is alert and oriented 3. Much decrease in edema to the right lower extremity since I saw her last she has great range of motion just mild tenderness on palpation of the incision sites.  Assessment: Well-healing surgical foot right.  Plan: I recommend she continue formal physical therapy we will add an hour a day to her work regimen and I will follow-up with her in 1 month to 6 weeks. She should be getting close to normal activity by that time.

## 2017-02-13 DIAGNOSIS — S86011D Strain of right Achilles tendon, subsequent encounter: Secondary | ICD-10-CM | POA: Diagnosis not present

## 2017-02-13 DIAGNOSIS — M25571 Pain in right ankle and joints of right foot: Secondary | ICD-10-CM | POA: Diagnosis not present

## 2017-02-15 DIAGNOSIS — M25571 Pain in right ankle and joints of right foot: Secondary | ICD-10-CM | POA: Diagnosis not present

## 2017-02-15 DIAGNOSIS — S86011D Strain of right Achilles tendon, subsequent encounter: Secondary | ICD-10-CM | POA: Diagnosis not present

## 2017-02-20 DIAGNOSIS — S86011D Strain of right Achilles tendon, subsequent encounter: Secondary | ICD-10-CM | POA: Diagnosis not present

## 2017-02-20 DIAGNOSIS — M25571 Pain in right ankle and joints of right foot: Secondary | ICD-10-CM | POA: Diagnosis not present

## 2017-02-28 NOTE — Telephone Encounter (Signed)
Fine with me

## 2017-03-01 ENCOUNTER — Encounter: Payer: Self-pay | Admitting: *Deleted

## 2017-03-12 ENCOUNTER — Ambulatory Visit: Payer: BLUE CROSS/BLUE SHIELD | Admitting: Podiatry

## 2017-03-13 DIAGNOSIS — J029 Acute pharyngitis, unspecified: Secondary | ICD-10-CM | POA: Diagnosis not present

## 2017-03-13 DIAGNOSIS — R05 Cough: Secondary | ICD-10-CM | POA: Diagnosis not present

## 2017-03-13 DIAGNOSIS — I10 Essential (primary) hypertension: Secondary | ICD-10-CM | POA: Diagnosis not present

## 2017-03-13 DIAGNOSIS — J069 Acute upper respiratory infection, unspecified: Secondary | ICD-10-CM | POA: Diagnosis not present

## 2017-03-19 DIAGNOSIS — R05 Cough: Secondary | ICD-10-CM | POA: Diagnosis not present

## 2017-03-19 DIAGNOSIS — R062 Wheezing: Secondary | ICD-10-CM | POA: Diagnosis not present

## 2017-03-21 DIAGNOSIS — J302 Other seasonal allergic rhinitis: Secondary | ICD-10-CM | POA: Diagnosis not present

## 2017-03-21 DIAGNOSIS — I10 Essential (primary) hypertension: Secondary | ICD-10-CM | POA: Diagnosis not present

## 2017-03-21 DIAGNOSIS — M7660 Achilles tendinitis, unspecified leg: Secondary | ICD-10-CM | POA: Diagnosis not present

## 2017-03-30 DIAGNOSIS — Z0000 Encounter for general adult medical examination without abnormal findings: Secondary | ICD-10-CM | POA: Diagnosis not present

## 2017-03-30 DIAGNOSIS — I10 Essential (primary) hypertension: Secondary | ICD-10-CM | POA: Diagnosis not present

## 2017-03-30 DIAGNOSIS — Z23 Encounter for immunization: Secondary | ICD-10-CM | POA: Diagnosis not present

## 2017-03-30 DIAGNOSIS — J302 Other seasonal allergic rhinitis: Secondary | ICD-10-CM | POA: Diagnosis not present

## 2017-03-30 DIAGNOSIS — F411 Generalized anxiety disorder: Secondary | ICD-10-CM | POA: Diagnosis not present

## 2017-04-04 ENCOUNTER — Encounter: Payer: Self-pay | Admitting: Obstetrics and Gynecology

## 2017-04-04 ENCOUNTER — Ambulatory Visit (INDEPENDENT_AMBULATORY_CARE_PROVIDER_SITE_OTHER): Payer: BLUE CROSS/BLUE SHIELD | Admitting: Obstetrics and Gynecology

## 2017-04-04 VITALS — BP 95/58 | HR 73 | Ht 64.0 in | Wt 207.8 lb

## 2017-04-04 DIAGNOSIS — I10 Essential (primary) hypertension: Secondary | ICD-10-CM

## 2017-04-04 DIAGNOSIS — Z1231 Encounter for screening mammogram for malignant neoplasm of breast: Secondary | ICD-10-CM

## 2017-04-04 DIAGNOSIS — Z6835 Body mass index (BMI) 35.0-35.9, adult: Secondary | ICD-10-CM

## 2017-04-04 DIAGNOSIS — Z01419 Encounter for gynecological examination (general) (routine) without abnormal findings: Secondary | ICD-10-CM | POA: Diagnosis not present

## 2017-04-04 DIAGNOSIS — N951 Menopausal and female climacteric states: Secondary | ICD-10-CM

## 2017-04-04 NOTE — Patient Instructions (Addendum)
Health Maintenance for Postmenopausal Women Menopause is a normal process in which your reproductive ability comes to an end. This process happens gradually over a span of months to years, usually between the ages of 33 and 38. Menopause is complete when you have missed 12 consecutive menstrual periods. It is important to talk with your health care provider about some of the most common conditions that affect postmenopausal women, such as heart disease, cancer, and bone loss (osteoporosis). Adopting a healthy lifestyle and getting preventive care can help to promote your health and wellness. Those actions can also lower your chances of developing some of these common conditions. What should I know about menopause? During menopause, you may experience a number of symptoms, such as:  Moderate-to-severe hot flashes.  Night sweats.  Decrease in sex drive.  Mood swings.  Headaches.  Tiredness.  Irritability.  Memory problems.  Insomnia. Choosing to treat or not to treat menopausal changes is an individual decision that you make with your health care provider. What should I know about hormone replacement therapy and supplements? Hormone therapy products are effective for treating symptoms that are associated with menopause, such as hot flashes and night sweats. Hormone replacement carries certain risks, especially as you become older. If you are thinking about using estrogen or estrogen with progestin treatments, discuss the benefits and risks with your health care provider. What should I know about heart disease and stroke? Heart disease, heart attack, and stroke become more likely as you age. This may be due, in part, to the hormonal changes that your body experiences during menopause. These can affect how your body processes dietary fats, triglycerides, and cholesterol. Heart attack and stroke are both medical emergencies. There are many things that you can do to help prevent heart disease  and stroke:  Have your blood pressure checked at least every 1-2 years. High blood pressure causes heart disease and increases the risk of stroke.  If you are 48-61 years old, ask your health care provider if you should take aspirin to prevent a heart attack or a stroke.  Do not use any tobacco products, including cigarettes, chewing tobacco, or electronic cigarettes. If you need help quitting, ask your health care provider.  It is important to eat a healthy diet and maintain a healthy weight.  Be sure to include plenty of vegetables, fruits, low-fat dairy products, and lean protein.  Avoid eating foods that are high in solid fats, added sugars, or salt (sodium).  Get regular exercise. This is one of the most important things that you can do for your health.  Try to exercise for at least 150 minutes each week. The type of exercise that you do should increase your heart rate and make you sweat. This is known as moderate-intensity exercise.  Try to do strengthening exercises at least twice each week. Do these in addition to the moderate-intensity exercise.  Know your numbers.Ask your health care provider to check your cholesterol and your blood glucose. Continue to have your blood tested as directed by your health care provider. What should I know about cancer screening? There are several types of cancer. Take the following steps to reduce your risk and to catch any cancer development as early as possible. Breast Cancer  Practice breast self-awareness.  This means understanding how your breasts normally appear and feel.  It also means doing regular breast self-exams. Let your health care provider know about any changes, no matter how small.  If you are 40 or older,  have a clinician do a breast exam (clinical breast exam or CBE) every year. Depending on your age, family history, and medical history, it may be recommended that you also have a yearly breast X-ray (mammogram).  If you  have a family history of breast cancer, talk with your health care provider about genetic screening.  If you are at high risk for breast cancer, talk with your health care provider about having an MRI and a mammogram every year.  Breast cancer (BRCA) gene test is recommended for women who have family members with BRCA-related cancers. Results of the assessment will determine the need for genetic counseling and BRCA1 and for BRCA2 testing. BRCA-related cancers include these types:  Breast. This occurs in males or females.  Ovarian.  Tubal. This may also be called fallopian tube cancer.  Cancer of the abdominal or pelvic lining (peritoneal cancer).  Prostate.  Pancreatic. Cervical, Uterine, and Ovarian Cancer  Your health care provider may recommend that you be screened regularly for cancer of the pelvic organs. These include your ovaries, uterus, and vagina. This screening involves a pelvic exam, which includes checking for microscopic changes to the surface of your cervix (Pap test).  For women ages 21-65, health care providers may recommend a pelvic exam and a Pap test every three years. For women ages 23-65, they may recommend the Pap test and pelvic exam, combined with testing for human papilloma virus (HPV), every five years. Some types of HPV increase your risk of cervical cancer. Testing for HPV may also be done on women of any age who have unclear Pap test results.  Other health care providers may not recommend any screening for nonpregnant women who are considered low risk for pelvic cancer and have no symptoms. Ask your health care provider if a screening pelvic exam is right for you.  If you have had past treatment for cervical cancer or a condition that could lead to cancer, you need Pap tests and screening for cancer for at least 20 years after your treatment. If Pap tests have been discontinued for you, your risk factors (such as having a new sexual partner) need to be reassessed  to determine if you should start having screenings again. Some women have medical problems that increase the chance of getting cervical cancer. In these cases, your health care provider may recommend that you have screening and Pap tests more often.  If you have a family history of uterine cancer or ovarian cancer, talk with your health care provider about genetic screening.  If you have vaginal bleeding after reaching menopause, tell your health care provider.  There are currently no reliable tests available to screen for ovarian cancer. Lung Cancer  Lung cancer screening is recommended for adults 99-83 years old who are at high risk for lung cancer because of a history of smoking. A yearly low-dose CT scan of the lungs is recommended if you:  Currently smoke.  Have a history of at least 30 pack-years of smoking and you currently smoke or have quit within the past 15 years. A pack-year is smoking an average of one pack of cigarettes per day for one year. Yearly screening should:  Continue until it has been 15 years since you quit.  Stop if you develop a health problem that would prevent you from having lung cancer treatment. Colorectal Cancer  This type of cancer can be detected and can often be prevented.  Routine colorectal cancer screening usually begins at age 72 and continues  through age 75.  If you have risk factors for colon cancer, your health care provider may recommend that you be screened at an earlier age.  If you have a family history of colorectal cancer, talk with your health care provider about genetic screening.  Your health care provider may also recommend using home test kits to check for hidden blood in your stool.  A small camera at the end of a tube can be used to examine your colon directly (sigmoidoscopy or colonoscopy). This is done to check for the earliest forms of colorectal cancer.  Direct examination of the colon should be repeated every 5-10 years until  age 75. However, if early forms of precancerous polyps or small growths are found or if you have a family history or genetic risk for colorectal cancer, you may need to be screened more often. Skin Cancer  Check your skin from head to toe regularly.  Monitor any moles. Be sure to tell your health care provider:  About any new moles or changes in moles, especially if there is a change in a mole's shape or color.  If you have a mole that is larger than the size of a pencil eraser.  If any of your family members has a history of skin cancer, especially at a young age, talk with your health care provider about genetic screening.  Always use sunscreen. Apply sunscreen liberally and repeatedly throughout the day.  Whenever you are outside, protect yourself by wearing long sleeves, pants, a wide-brimmed hat, and sunglasses. What should I know about osteoporosis? Osteoporosis is a condition in which bone destruction happens more quickly than new bone creation. After menopause, you may be at an increased risk for osteoporosis. To help prevent osteoporosis or the bone fractures that can happen because of osteoporosis, the following is recommended:  If you are 19-50 years old, get at least 1,000 mg of calcium and at least 600 mg of vitamin D per day.  If you are older than age 50 but younger than age 70, get at least 1,200 mg of calcium and at least 600 mg of vitamin D per day.  If you are older than age 70, get at least 1,200 mg of calcium and at least 800 mg of vitamin D per day. Smoking and excessive alcohol intake increase the risk of osteoporosis. Eat foods that are rich in calcium and vitamin D, and do weight-bearing exercises several times each week as directed by your health care provider. What should I know about how menopause affects my mental health? Depression may occur at any age, but it is more common as you become older. Common symptoms of depression include:  Low or sad  mood.  Changes in sleep patterns.  Changes in appetite or eating patterns.  Feeling an overall lack of motivation or enjoyment of activities that you previously enjoyed.  Frequent crying spells. Talk with your health care provider if you think that you are experiencing depression. What should I know about immunizations? It is important that you get and maintain your immunizations. These include:  Tetanus, diphtheria, and pertussis (Tdap) booster vaccine.  Influenza every year before the flu season begins.  Pneumonia vaccine.  Shingles vaccine. Your health care provider may also recommend other immunizations. This information is not intended to replace advice given to you by your health care provider. Make sure you discuss any questions you have with your health care provider. Document Released: 12/22/2005 Document Revised: 05/19/2016 Document Reviewed: 08/03/2015 Elsevier Interactive Patient   Education  2017 [employer]  

## 2017-04-04 NOTE — Progress Notes (Signed)
ANNUAL PREVENTATIVE CARE GYNECOLOGY  ENCOUNTER NOTE  Subjective:       Kelly Pittman is a 51 y.o. [phone] (adopted daughter) female here for a routine annual gynecologic exam. The patient is sexually active, 1 partner. The patient is taking hormone replacement therapy (OTC Estroven and herbal remedies). Patient denies post-menopausal vaginal bleeding. The patient wears seatbelts: yes. The patient participates in regular exercise: yes (2 times weekly, walking). Has the patient ever been transfused or tattooed?: no. The patient reports that there is not domestic violence in her life.  Current complaints: 1.  None   Gynecologic History No LMP recorded. Patient has had a hysterectomy.  Menarche age: 53 Contraception: status post hysterectomy Last Pap: 12/2015. Results were: normal Last mammogram: 12/2015. Results were: normal.  Patient with h/o abnormal mammogram 20 years ago, had right breast biopsy, benign findings.  Last Colonoscopy: 3.5 years ago, found polyps, need repeat after 5 years.     Obstetric History OB History  Gravida Para Term Preterm AB Living  3 0 0 0 3 1  SAB TAB Ectopic Multiple Live Births  3 0 0 0      # Outcome Date GA Lbr Len/2nd Weight Sex Delivery Anes PTL Lv  3 SAB           2 SAB           1 SAB             Obstetric Comments  Patient has 1 adopted daughter    Past Medical History:  Diagnosis Date  . Anxiety   . Hypertension     Family History  Problem Relation Age of Onset  . Hypertension Mother   . Hypertension Father   . Breast cancer Neg Hx   . Ovarian cancer Neg Hx   . Stroke Neg Hx   . Thyroid disease Neg Hx     Past Surgical History:  Procedure Laterality Date  . ABDOMINAL HYSTERECTOMY    . CARPAL TUNNEL RELEASE    . UNILATERAL SALPINGECTOMY     left    Social History   Social History  . Marital status: Married    Spouse name: N/A  . Number of children: N/A  . Years of education: N/A   Occupational History  .  Not on file.   Social History Main Topics  . Smoking status: Never Smoker  . Smokeless tobacco: Never Used  . Alcohol use Yes     Comment: socially  . Drug use: No  . Sexual activity: Not Currently    Birth control/ protection: Surgical   Other Topics Concern  . Not on file   Social History Narrative  . No narrative on file    Current Outpatient Prescriptions on File Prior to Visit  Medication Sig Dispense Refill  . acetaminophen (TYLENOL ARTHRITIS PAIN) 650 MG CR tablet Take 650 mg by mouth every 8 (eight) hours as needed for pain.    Marland Kitchen BLACK COHOSH PO Take 1 tablet by mouth 2 (two) times daily.    . celecoxib (CELEBREX) 200 MG capsule Take 1 capsule (200 mg total) by mouth 2 (two) times daily. 60 capsule 3  . desloratadine (CLARINEX) 5 MG tablet Take 5 mg by mouth daily.    . diclofenac sodium (VOLTAREN) 1 % GEL Apply 4 g topically 4 (four) times daily. 100 g 3  . escitalopram (LEXAPRO) 10 MG tablet Take 10 mg by mouth daily.    Marland Kitchen ibuprofen (ADVIL,MOTRIN) 800 MG  tablet TAKE 1 TABLET BY MOUTH 4 TIMES A DAY WITH FOOD 90 tablet 1  . lansoprazole (PREVACID) 30 MG capsule Take 30 mg by mouth daily at 12 noon.    . montelukast (SINGULAIR) 10 MG tablet Take 10 mg by mouth at bedtime.    . Multiple Vitamins-Minerals (MULTIVITAMIN WITH MINERALS) tablet Take 1 tablet by mouth daily.    . Olopatadine HCl (PATANASE NA) Place into the nose.    . SOY ISOFLAVONE PO Take by mouth.    . valsartan-hydrochlorothiazide (DIOVAN-HCT) 160-12.5 MG per tablet Take 1 tablet by mouth daily.    . vitamin C (ASCORBIC ACID) 500 MG tablet Take 500 mg by mouth daily.    Marland Kitchen zolpidem (AMBIEN) 5 MG tablet Take 1 tablet (5 mg total) by mouth at bedtime as needed for sleep. 15 tablet 1   No current facility-administered medications on file prior to visit.     Allergies  Allergen Reactions  . Tape Rash     Review of Systems ROS Review of Systems - General ROS: negative for - chills, fatigue, fever, hot  flashes, night sweats, weight gain or weight loss Psychological ROS: negative for - anxiety, decreased libido, depression, mood swings, physical abuse or sexual abuse Ophthalmic ROS: negative for - blurry vision, eye pain or loss of vision ENT ROS: negative for - headaches, hearing change, visual changes or vocal changes Allergy and Immunology ROS: negative for - hives, itchy/watery eyes or seasonal allergies Hematological and Lymphatic ROS: negative for - bleeding problems, bruising, swollen lymph nodes or weight loss Endocrine ROS: negative for - galactorrhea, hair pattern changes, hot flashes, malaise/lethargy, mood swings, palpitations, polydipsia/polyuria, skin changes, temperature intolerance or unexpected weight changes Breast ROS: negative for - new or changing breast lumps or nipple discharge Respiratory ROS: negative for - cough or shortness of breath Cardiovascular ROS: negative for - chest pain, irregular heartbeat, palpitations or shortness of breath Gastrointestinal ROS: no abdominal pain, change in bowel habits, or black or bloody stools Genito-Urinary ROS: no dysuria, trouble voiding, or hematuria Musculoskeletal ROS: negative for - joint pain or joint stiffness Neurological ROS: negative for - bowel and bladder control changes Dermatological ROS: negative for rash and skin lesion changes   Objective:   BP (!) 95/58 (BP Location: Left Arm, Patient Position: Sitting, Cuff Size: Large)   Pulse 73   Ht 5\' 4"  (1.626 m)   Wt 207 lb 12.8 oz (94.3 kg)   BMI 35.67 kg/m  CONSTITUTIONAL: Well-developed, well-nourished female in no acute distress. Moderate obesity PSYCHIATRIC: Normal mood and affect. Normal behavior. Normal judgment and thought content. [place]: Alert and oriented to person, place, and time. Normal muscle tone coordination. No cranial nerve deficit noted. HENT:  Normocephalic, atraumatic, External right and left ear normal. Oropharynx is clear and moist EYES:  Conjunctivae and EOM are normal. Pupils are equal, round, and reactive to light. No scleral icterus.  NECK: Normal range of motion, supple, no masses.  Normal thyroid.  SKIN: Skin is warm and dry. No rash noted. Not diaphoretic. No erythema. No pallor. CARDIOVASCULAR: Normal heart rate noted, regular rhythm, no murmur. RESPIRATORY: Clear to auscultation bilaterally. Effort and breath sounds normal, no problems with respiration noted. BREASTS: Symmetric in size. No masses, skin changes, nipple drainage, or lymphadenopathy.  Right breast with small faint scar from prior biopsy site.  ABDOMEN: Soft, normal bowel sounds, no distention noted.  No tenderness, rebound or guarding. Well healed low transverse incision scar.  PELVIC:  Bladder no bladder distension  noted  Urethra: normal appearing urethra with no masses, tenderness or lesions  Vulva: normal appearing vulva with no masses, tenderness or lesions  Vagina: normal appearing vagina with normal color and discharge, no lesions  Cervix: surgically absent  Uterus: surgically absent, vaginal cuff well healed  Adnexa: normal right adnexa in size, nontender and no masses. Left adnexa surgically absent.  RV: External Exam NormaI, No Rectal Masses and Normal Sphincter tone  MUSCULOSKELETAL: Normal range of motion. No tenderness.  No cyanosis, clubbing.  Trace edema bilaterally at ankles.  2+ distal pulses. LYMPHATIC: No Axillary, Supraclavicular, or Inguinal Adenopathy.    Assessment:   Annual gynecologic examination 51 y.o.  Problem List Items Addressed This Visit      Cardiovascular and Mediastinum   Essential hypertension     Other   Obesity    Other Visit Diagnoses    Encounter for well woman exam with routine gynecological exam    -  Primary   Breast cancer screening by mammogram       Relevant Orders   MM DIGITAL SCREENING BILATERAL   Menopausal and female climacteric states          Plan:  Pap: Not needed.  Advised patient  that she no longer needs pap smears after a hysterectomy if no h/o severe cervical dysplasia. Mammogram: Ordered Stool Guaiac Testing:  Not Ordered.  Patient has had colonoscopy 3.5 years ago.  Will need to repeat at 5 year intervals due to colon polyps.  Labs: Reviewed in Care Everywhere with service date 03/10/2017 Routine preventative health maintenance measures emphasized: Exercise/Diet/Weight control, Alcohol/Substance use risks and Stress Management  Contraception: status post hysterectomy Essential HTN managed by PCP.  BPs actually on lower side of normal today.  Notes her medications were recently changed. Patient with menopausal vasomotor symptoms, managed by OTC meds (herbal remedies and Estroven). Cautioned that alternative therapies such as herbal remedies contained phytoestrogens (plant estrogens) in unregulated amounts which can have the same effects on the body as the pharmaceutical estrogen preparations.  Will continue to monitor symptoms.  Return to [place], MD [facility]

## 2017-04-11 ENCOUNTER — Ambulatory Visit (INDEPENDENT_AMBULATORY_CARE_PROVIDER_SITE_OTHER): Payer: Self-pay | Admitting: Podiatry

## 2017-04-11 ENCOUNTER — Encounter: Payer: Self-pay | Admitting: Podiatry

## 2017-04-11 DIAGNOSIS — M7661 Achilles tendinitis, right leg: Secondary | ICD-10-CM

## 2017-04-11 MED ORDER — IBUPROFEN 800 MG PO TABS: ORAL_TABLET | ORAL | 1 refills | Status: DC

## 2017-04-11 NOTE — Progress Notes (Signed)
She presents today almost 6 months status post peroneal tendon repair retrocalcaneal heel spur resection gastroc recession and Achilles tendon lysis. She states this still swelling by the end of the day but for the most part it's approximately 50% better than it was prior to surgery.  Objective: Vital signs are stable she is alert and oriented 3 mild edema about the right ankle but similar edema along the left ankle as well pain she still has mild tenderness overlying the peroneal tendons but none in the posterior aspect of the foot and leg.  Assessment: Well-healing surgical foot and leg right.  Plan: Encouraged her to continue current therapies and I will follow-up with her in 2 months.

## 2017-05-01 ENCOUNTER — Ambulatory Visit
Admission: RE | Admit: 2017-05-01 | Discharge: 2017-05-01 | Disposition: A | Discharge status: Home / Self Care | Payer: BLUE CROSS/BLUE SHIELD | Source: Ambulatory Visit | Attending: Obstetrics and Gynecology | Admitting: Obstetrics and Gynecology

## 2017-05-01 DIAGNOSIS — Z1231 Encounter for screening mammogram for malignant neoplasm of breast: Secondary | ICD-10-CM | POA: Diagnosis not present

## 2017-06-11 ENCOUNTER — Encounter: Payer: Self-pay | Admitting: Podiatry

## 2017-06-11 ENCOUNTER — Ambulatory Visit (INDEPENDENT_AMBULATORY_CARE_PROVIDER_SITE_OTHER): Payer: BLUE CROSS/BLUE SHIELD | Admitting: Podiatry

## 2017-06-11 DIAGNOSIS — M722 Plantar fascial fibromatosis: Secondary | ICD-10-CM | POA: Diagnosis not present

## 2017-06-11 DIAGNOSIS — Z9889 Other specified postprocedural states: Secondary | ICD-10-CM

## 2017-06-11 DIAGNOSIS — M7661 Achilles tendinitis, right leg: Secondary | ICD-10-CM

## 2017-06-11 NOTE — Progress Notes (Signed)
She presents today for postop visit date of surgery is 10/13/2016. She states that she had peroneus brevis tendon repair with a gastroc recession calcaneal ostectomy and a retrocalcaneal heel spur resection. With an Achilles tendon lysis included. She states it mostly she does pretty well states that lately the peroneal tendon part of his thin very tender and very sore and it still swells on long car trips.  Objective: No signs are stable she is alert and oriented 3 pulses are palpable no calf pain no pain in the posterior aspect of the leg just along the peroneal tendon. On palpation she is painful if her malleoli or with what appears to be fluctuance within the tendon sheath.  Assessment peroneal tendinitis with fluid.  Plan: Postsurgically she is doing well for all that she had done however she still has edema over that she'll still wear her compression hose. I feel that she's needs to have an injection or take steroid but she does not want to take an oral medication so put 2 mg of dexamethasone to the point of maximal tenderness and hopefully this will alleviate her symptoms. I will follow-up with her in about 3 months area

## 2017-06-13 ENCOUNTER — Ambulatory Visit: Payer: BLUE CROSS/BLUE SHIELD | Admitting: Podiatry

## 2017-09-07 DIAGNOSIS — J019 Acute sinusitis, unspecified: Secondary | ICD-10-CM | POA: Diagnosis not present

## 2017-09-12 ENCOUNTER — Encounter: Payer: Self-pay | Admitting: Podiatry

## 2017-09-12 ENCOUNTER — Ambulatory Visit (INDEPENDENT_AMBULATORY_CARE_PROVIDER_SITE_OTHER): Payer: BLUE CROSS/BLUE SHIELD | Admitting: Podiatry

## 2017-09-12 DIAGNOSIS — M7671 Peroneal tendinitis, right leg: Secondary | ICD-10-CM | POA: Diagnosis not present

## 2017-09-15 NOTE — Progress Notes (Signed)
She presents for follow-up of her peroneal tendinitis and repair January of last year. She states this seems to be doing much better and she is at the point that she feels that she can do almost anything she wants to. States that it feels much better than it did before surgery. Still relates some numbness around the incision site.  Objective: Vital signs are stable she is alert and oriented 3 incision site is gone on to heal uneventfully there is no Pain. He has good plantarflexion and eversion and abduction against resistance.  Assessment well-healing surgical foot 10 months out.  Plan: Follow up with me on an as-needed basis.

## 2017-10-08 DIAGNOSIS — J40 Bronchitis, not specified as acute or chronic: Secondary | ICD-10-CM | POA: Diagnosis not present

## 2017-10-15 DIAGNOSIS — J40 Bronchitis, not specified as acute or chronic: Secondary | ICD-10-CM | POA: Diagnosis not present

## 2017-11-02 DIAGNOSIS — R1032 Left lower quadrant pain: Secondary | ICD-10-CM | POA: Diagnosis not present

## 2017-11-02 DIAGNOSIS — E876 Hypokalemia: Secondary | ICD-10-CM | POA: Diagnosis not present

## 2017-11-09 ENCOUNTER — Ambulatory Visit
Admission: RE | Admit: 2017-11-09 | Discharge: 2017-11-09 | Disposition: A | Discharge status: Home / Self Care | Payer: BLUE CROSS/BLUE SHIELD | Source: Ambulatory Visit | Attending: Family Medicine | Admitting: Family Medicine

## 2017-11-09 ENCOUNTER — Other Ambulatory Visit: Payer: Self-pay | Admitting: Family Medicine

## 2017-11-09 DIAGNOSIS — R109 Unspecified abdominal pain: Secondary | ICD-10-CM | POA: Insufficient documentation

## 2017-11-09 DIAGNOSIS — R112 Nausea with vomiting, unspecified: Secondary | ICD-10-CM | POA: Diagnosis not present

## 2017-11-09 DIAGNOSIS — R1110 Vomiting, unspecified: Secondary | ICD-10-CM | POA: Diagnosis not present

## 2017-11-09 DIAGNOSIS — Z90710 Acquired absence of both cervix and uterus: Secondary | ICD-10-CM | POA: Diagnosis not present

## 2017-11-09 MED ORDER — IOPAMIDOL (ISOVUE-300) INJECTION 61%
100.0000 mL | Freq: Once | INTRAVENOUS | Status: AC | PRN
  Administered 2017-11-09: 100 mL via INTRAVENOUS

## 2017-11-15 DIAGNOSIS — E876 Hypokalemia: Secondary | ICD-10-CM | POA: Diagnosis not present

## 2017-11-22 ENCOUNTER — Ambulatory Visit: Payer: BLUE CROSS/BLUE SHIELD | Admitting: Physical Therapy

## 2017-11-29 ENCOUNTER — Ambulatory Visit: Payer: BLUE CROSS/BLUE SHIELD | Admitting: Physical Therapy

## 2017-11-29 ENCOUNTER — Encounter: Payer: BLUE CROSS/BLUE SHIELD | Admitting: Physical Therapy

## 2017-12-13 ENCOUNTER — Ambulatory Visit: Payer: BLUE CROSS/BLUE SHIELD | Admitting: Physical Therapy

## 2017-12-19 ENCOUNTER — Encounter: Payer: BLUE CROSS/BLUE SHIELD | Admitting: Physical Therapy

## 2017-12-27 ENCOUNTER — Encounter: Payer: BLUE CROSS/BLUE SHIELD | Admitting: Physical Therapy

## 2018-01-10 ENCOUNTER — Encounter: Payer: BLUE CROSS/BLUE SHIELD | Admitting: Physical Therapy

## 2018-01-24 ENCOUNTER — Encounter: Payer: BLUE CROSS/BLUE SHIELD | Admitting: Physical Therapy

## 2018-02-07 ENCOUNTER — Encounter: Payer: BLUE CROSS/BLUE SHIELD | Admitting: Physical Therapy

## 2018-03-12 DIAGNOSIS — H6980 Other specified disorders of Eustachian tube, unspecified ear: Secondary | ICD-10-CM | POA: Diagnosis not present

## 2018-03-12 DIAGNOSIS — I10 Essential (primary) hypertension: Secondary | ICD-10-CM | POA: Diagnosis not present

## 2018-03-12 DIAGNOSIS — J309 Allergic rhinitis, unspecified: Secondary | ICD-10-CM | POA: Diagnosis not present

## 2018-03-28 ENCOUNTER — Telehealth: Payer: Self-pay

## 2018-03-28 NOTE — Telephone Encounter (Signed)
Copied from [place] [id number]. Topic: Appointment Scheduling - Prior Auth Required for Appointment >> Mar 28, 2018  2:50 PM Kelly Pittman, Helene Kelp D wrote: No appointment has been scheduled. Patient is requesting NP appointment with Dr. Nicki Reaper Per scheduling protocol, this appointment requires a prior authorization prior to scheduling.  Route to department's PEC pool.

## 2018-03-28 NOTE — Telephone Encounter (Signed)
Copied from [place] [id number]. Topic: Appointment Scheduling - Prior Auth Required for Appointment >> Mar 28, 2018  2:50 PM Robina Ade, Helene Kelp D wrote: No appointment has been scheduled. Patient is requesting NP appointment with Dr. Nicki Reaper Per scheduling protocol, this appointment requires a prior authorization prior to scheduling.  Route to department's PEC pool.

## 2018-03-29 NOTE — Telephone Encounter (Signed)
Lm to let pt know that Dr.Scott is NOT taking on any New Pt but we have Dr.Mclean and M. Arnett that are taking on New Pt

## 2018-03-29 NOTE — Telephone Encounter (Signed)
msg sent to provider 

## 2018-04-09 ENCOUNTER — Ambulatory Visit (INDEPENDENT_AMBULATORY_CARE_PROVIDER_SITE_OTHER): Payer: BLUE CROSS/BLUE SHIELD | Admitting: Obstetrics and Gynecology

## 2018-04-09 ENCOUNTER — Encounter: Payer: Self-pay | Admitting: Obstetrics and Gynecology

## 2018-04-09 VITALS — BP 90/60 | HR 65 | Ht 64.0 in | Wt 207.1 lb

## 2018-04-09 DIAGNOSIS — N951 Menopausal and female climacteric states: Secondary | ICD-10-CM | POA: Diagnosis not present

## 2018-04-09 DIAGNOSIS — Z1239 Encounter for other screening for malignant neoplasm of breast: Secondary | ICD-10-CM | POA: Diagnosis not present

## 2018-04-09 DIAGNOSIS — E668 Other obesity: Secondary | ICD-10-CM | POA: Diagnosis not present

## 2018-04-09 DIAGNOSIS — I10 Essential (primary) hypertension: Secondary | ICD-10-CM | POA: Diagnosis not present

## 2018-04-09 DIAGNOSIS — Z131 Encounter for screening for diabetes mellitus: Secondary | ICD-10-CM | POA: Diagnosis not present

## 2018-04-09 DIAGNOSIS — Z01419 Encounter for gynecological examination (general) (routine) without abnormal findings: Secondary | ICD-10-CM | POA: Diagnosis not present

## 2018-04-09 NOTE — Progress Notes (Signed)
Pt is present for her annual. Pt stated that do not have any itching, burning or discharge. Pt stated that she is doing well no concerns.

## 2018-04-09 NOTE — Patient Instructions (Signed)

## 2018-04-09 NOTE — Progress Notes (Signed)
ANNUAL PREVENTATIVE CARE GYNECOLOGY  ENCOUNTER NOTE  Subjective:       Kelly Pittman is a 52 y.o. [phone] (adopted daughter) female here for a routine annual gynecologic exam. The patient is sexually active, 1 partner. The patient is taking herbal remedies for hormone replacement therapy (OTC [place]). Patient denies post-menopausal vaginal bleeding. The patient wears seatbelts: yes. The patient participates in regular exercise: no. Has the patient ever been transfused or tattooed?: no. The patient reports that there is not domestic violence in her life.  Current complaints: 1.  None   Gynecologic History No LMP recorded. Patient has had a hysterectomy.  Menarche age: 30 Contraception: status post hysterectomy Last Pap: 12/2015. Results were: normal Last mammogram:04/2017. Results were: normal.  Patient with h/o abnormal mammogram 20 years ago, had right breast biopsy, benign findings.  Last Colonoscopy: 4.5 years ago, found polyps, need repeat after 5 years.     Obstetric History OB History  Gravida Para Term Preterm AB Living  3 0 0 0 3 1  SAB TAB Ectopic Multiple Live Births  3 0 0 0      # Outcome Date GA Lbr Len/2nd Weight Sex Delivery Anes PTL Lv  3 SAB           2 SAB           1 SAB             Obstetric Comments  Patient has 1 adopted daughter    Past Medical History:  Diagnosis Date  . Anxiety   . 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 Colon polyp 2014  . Hypertension     Family History  Problem Relation Age of Onset  . Hypertension Mother   . Hypertension Father   . Breast cancer Neg Hx   . Ovarian cancer Neg Hx   . Stroke Neg Hx   . Thyroid disease Neg Hx     Past Surgical History:  Procedure Laterality Date  . ABDOMINAL HYSTERECTOMY    . BREAST BIOPSY Right 2003  . CARPAL TUNNEL RELEASE    . UNILATERAL SALPINGECTOMY     left    Social History   Socioeconomic History  . Marital status: Married    Spouse  name: Not on file  . Number of children: Not on file  . Years of education: Not on file  . Highest education level: Not on file  Occupational History  . Not on file  Social Needs  . Financial resource strain: Not on file  . Food insecurity:    Worry: Not on file    Inability: Not on file  . Transportation needs:    Medical: Not on file    Non-medical: Not on file  Tobacco Use  . Smoking status: Former Smoker    Packs/day: 0.25  . Smokeless tobacco: Never Used  Substance and Sexual Activity  . Alcohol use: Yes    Comment: socially  . Drug use: No  . Sexual activity: Yes    Birth control/protection: Surgical  Lifestyle  . Physical activity:    Days per week: Not on file    Minutes per session: Not on file  . Stress: Not on file  Relationships  . Social connections:    Talks on phone: Not on file    Gets together: Not on file    Attends religious service: Not on file    Active member of club or organization: Not on  file    Attends meetings of clubs or organizations: Not on file    Relationship status: Not on file  . Intimate partner violence:    Fear of current or ex partner: Not on file    Emotionally abused: Not on file    Physically abused: Not on file    Forced sexual activity: Not on file  Other Topics Concern  . Not on file  Social History Narrative  . Not on file    Current Outpatient Medications on File Prior to Visit  Medication Sig Dispense Refill  . acetaminophen (TYLENOL ARTHRITIS PAIN) 650 MG CR tablet Take 650 mg by mouth every 8 (eight) hours as needed for pain.    Marland Kitchen escitalopram (LEXAPRO) 10 MG tablet Take 10 mg by mouth daily.    . fluticasone (FLONASE) 50 MCG/ACT nasal spray Place into the nose.    . ibuprofen (ADVIL,MOTRIN) 800 MG tablet TAKE 1 TABLET BY MOUTH 4 TIMES A DAY AS NEEDED WITH FOOD 90 tablet 1  . irbesartan-hydrochlorothiazide (AVALIDE) 150-12.5 MG tablet Take 1 tablet by mouth daily.    . lansoprazole (PREVACID) 30 MG capsule Take 30  mg by mouth daily at 12 noon.    Marland Kitchen levocetirizine (XYZAL) 5 MG tablet TAKE 1 TABLET EVERY EVENING    . montelukast (SINGULAIR) 10 MG tablet Take 10 mg by mouth at bedtime.    . Multiple Vitamin (MULTIVITAMIN) capsule Take by mouth.    . Multiple Vitamins-Minerals (MULTIVITAMIN WITH MINERALS) tablet Take 1 tablet by mouth daily.    . Olopatadine HCl (PATANASE NA) Place into the nose.    . potassium chloride (K-DUR) 10 MEQ tablet Take by mouth.    . SOY ISOFLAVONE PO Take by mouth.    . vitamin C (ASCORBIC ACID) 500 MG tablet Take 500 mg by mouth daily.    Marland Kitchen albuterol (PROVENTIL HFA;VENTOLIN HFA) 108 (90 Base) MCG/ACT inhaler Inhale into the lungs.    Marland Kitchen BLACK COHOSH PO Take 1 tablet by mouth 2 (two) times daily.    . Black Cohosh-SoyIsoflav-Magnol (ESTROVEN MENOPAUSE RELIEF PO) Take by mouth.    . celecoxib (CELEBREX) 200 MG capsule Take 1 capsule (200 mg total) by mouth 2 (two) times daily. (Patient not taking: Reported on 04/09/2018) 60 capsule 3  . desloratadine (CLARINEX) 5 MG tablet Take 5 mg by mouth daily.    . diclofenac sodium (VOLTAREN) 1 % GEL Apply 4 g topically 4 (four) times daily. (Patient not taking: Reported on 04/09/2018) 100 g 3  . KLOR-CON M10 10 MEQ tablet     . olmesartan-hydrochlorothiazide (BENICAR HCT) 20-12.5 MG tablet     . valsartan-hydrochlorothiazide (DIOVAN-HCT) 160-12.5 MG per tablet Take 1 tablet by mouth daily.    Marland Kitchen zolpidem (AMBIEN) 5 MG tablet Take 1 tablet (5 mg total) by mouth at bedtime as needed for sleep. (Patient not taking: Reported on 04/09/2018) 15 tablet 1   No current facility-administered medications on file prior to visit.     Allergies  Allergen Reactions  . Losartan Potassium-Hctz     Other reaction(s): Other (See Comments) Weight gain, constipation  . Tape Rash     Review of Systems ROS Review of Systems - General ROS: negative for - chills, fatigue, fever, hot flashes, night sweats, weight gain or weight loss Psychological ROS:  negative for - anxiety, decreased libido, depression, mood swings, physical abuse or sexual abuse Ophthalmic ROS: negative for - blurry vision, eye pain or loss of vision ENT ROS: negative for -  headaches, hearing change, visual changes or vocal changes Allergy and Immunology ROS: negative for - hives, itchy/watery eyes or seasonal allergies Hematological and Lymphatic ROS: negative for - bleeding problems, bruising, swollen lymph nodes or weight loss Endocrine ROS: negative for - galactorrhea, hair pattern changes, hot flashes, malaise/lethargy, mood swings, palpitations, polydipsia/polyuria, skin changes, temperature intolerance or unexpected weight changes Breast ROS: negative for - new or changing breast lumps or nipple discharge Respiratory ROS: negative for - cough or shortness of breath Cardiovascular ROS: negative for - chest pain, irregular heartbeat, palpitations or shortness of breath Gastrointestinal ROS: no abdominal pain, change in bowel habits, or black or bloody stools Genito-Urinary ROS: no dysuria, trouble voiding, or hematuria Musculoskeletal ROS: negative for - joint pain or joint stiffness Neurological ROS: negative for - bowel and bladder control changes Dermatological ROS: negative for rash and skin lesion changes   Objective:   BP 90/60   Pulse 65   Ht 5\' 4"  (1.626 m)   Wt 207 lb 1.6 oz (93.9 kg)   BMI 35.55 kg/m  CONSTITUTIONAL: Well-developed, well-nourished female in no acute distress. Moderate obesity PSYCHIATRIC: Normal mood and affect. Normal behavior. Normal judgment and thought content. [place]: Alert and oriented to person, place, and time. Normal muscle tone coordination. No cranial nerve deficit noted. HENT:  Normocephalic, atraumatic, External right and left ear normal. Oropharynx is clear and moist EYES: Conjunctivae and EOM are normal. Pupils are equal, round, and reactive to light. No scleral icterus.  NECK: Normal range of motion, supple, no  masses.  Normal thyroid.  SKIN: Skin is warm and dry. No rash noted. Not diaphoretic. No erythema. No pallor. CARDIOVASCULAR: Normal heart rate noted, regular rhythm, no murmur. RESPIRATORY: Clear to auscultation bilaterally. Effort and breath sounds normal, no problems with respiration noted. BREASTS: Symmetric in size. No masses, skin changes, nipple drainage, or lymphadenopathy.  Right breast with small faint scar from prior biopsy site.  ABDOMEN: Soft, normal bowel sounds, no distention noted.  No tenderness, rebound or guarding. Well healed low transverse incision scar.  PELVIC:  Bladder no bladder distension noted  Urethra: normal appearing urethra with no masses, tenderness or lesions  Vulva: normal appearing vulva with no masses, tenderness or lesions  Vagina: normal appearing vagina with normal color and discharge, no lesions  Cervix: surgically absent  Uterus: surgically absent, vaginal cuff well healed  Adnexa: normal right adnexa in size, nontender and no masses. Left adnexa surgically absent.  RV: External Exam NormaI, No Rectal Masses and Normal Sphincter tone  MUSCULOSKELETAL: Normal range of motion. No tenderness.  No cyanosis, clubbing or edema.    2+ distal pulses. LYMPHATIC: No Axillary, Supraclavicular, or Inguinal Adenopathy.    Assessment:   Annual gynecologic examination 52 y.o. Menopausal syndrome on herbal remedy Moderate obesity Essential HTN Plan:  Pap: Not needed.  Advised patient that she no longer needs pap smears after a hysterectomy if no h/o severe cervical dysplasia. Mammogram: Ordered.  Stool Guaiac Testing:  Not Ordered.  Patient has had colonoscopy 4.5 years ago.  Will need to repeat at 5 year intervals due to colon polyps.  Labs: Reviewed in [place] with service date 03/10/2017.  Still needs CBC and random glucose. Will order. All other labs normal.  Routine preventative health maintenance measures emphasized: Exercise/Diet/Weight control,  Alcohol/Substance use risks and Stress Management  Contraception: status post hysterectomy Essential HTN managed by PCP.  BP currently low.  To discuss with PCP regarding dosing.  Patient with menopausal vasomotor symptoms, managed  by OTC meds [place]oy Register). Cautioned that alternative therapies such as herbal remedies contained phytoestrogens (plant estrogens) in unregulated amounts which can have the same effects on the body as the pharmaceutical estrogen preparations.  Will continue to monitor symptoms.  Return to [place], MD [facility]

## 2018-05-17 ENCOUNTER — Ambulatory Visit: Payer: BLUE CROSS/BLUE SHIELD | Admitting: Internal Medicine

## 2018-05-31 ENCOUNTER — Ambulatory Visit
Admission: RE | Admit: 2018-05-31 | Discharge: 2018-05-31 | Disposition: A | Discharge status: Home / Self Care | Payer: BLUE CROSS/BLUE SHIELD | Source: Ambulatory Visit | Attending: Obstetrics and Gynecology | Admitting: Obstetrics and Gynecology

## 2018-05-31 DIAGNOSIS — Z1239 Encounter for other screening for malignant neoplasm of breast: Secondary | ICD-10-CM

## 2018-05-31 DIAGNOSIS — Z1231 Encounter for screening mammogram for malignant neoplasm of breast: Secondary | ICD-10-CM | POA: Diagnosis not present

## 2018-06-03 ENCOUNTER — Other Ambulatory Visit: Payer: BLUE CROSS/BLUE SHIELD

## 2018-06-20 IMAGING — MR MR FOOT*R* W/O CM
5 series · 40 of 40 positions shown · non-contrast
Comparison: None.

CLINICAL DATA: Pain, swelling, burning for 3 months. Popping sound
when walking.

EXAM:
MRI OF THE RIGHT FOREFOOT WITHOUT CONTRAST
TECHNIQUE: Multiplanar, multisequence MR imaging was performed. No intravenous
contrast was administered.

[Series 3: PD fat-sat · axial · 3.0mm · 0.50mm/px · z∈[-72,+33]mm · 8 of 30 slices shown]
[im 1/30]
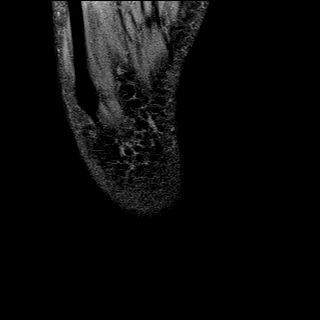
[im 5/30]
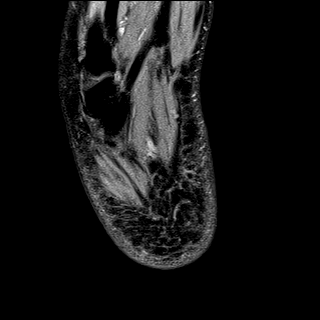
[im 9/30]
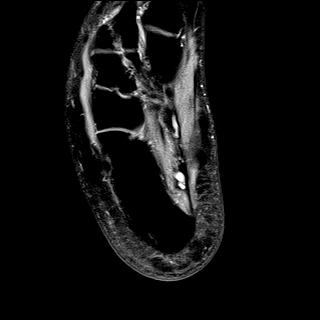
[im 13/30]
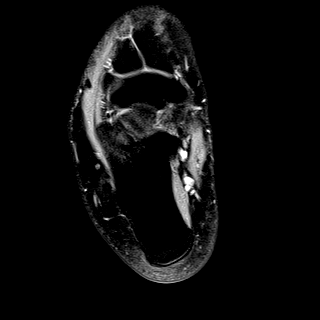
[im 17/30]
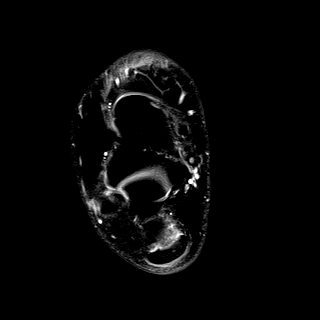
[im 21/30]
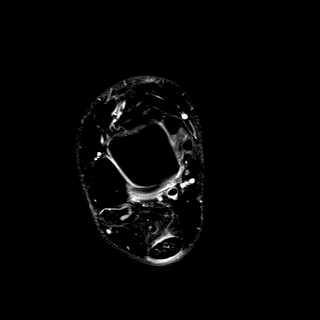
[im 25/30]
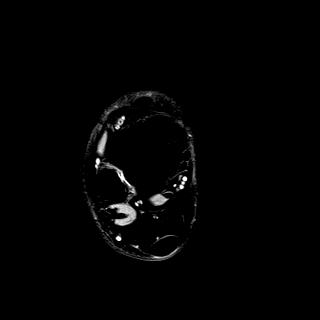
[im 30/30]
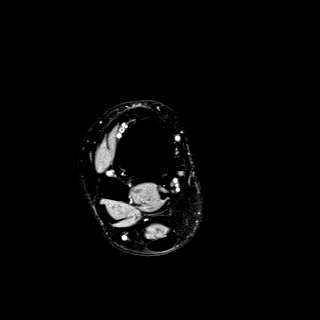

[Series 4: T2 fat-sat · axial · 3.0mm · 0.42mm/px · z∈[-72,+33]mm · 9 of 30 slices shown (1 of 3)]
[im 1/30]
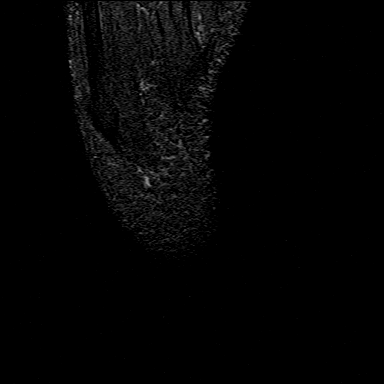
[im 4/30]
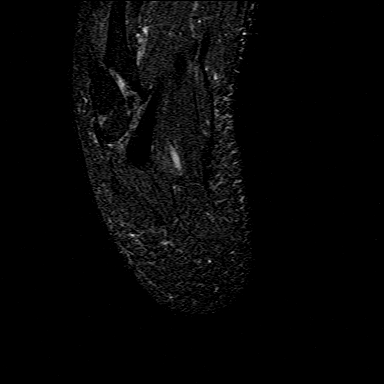
[im 8/30]
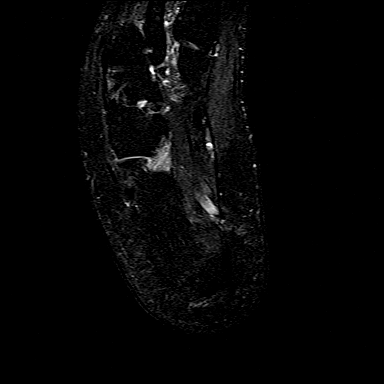
[im 11/30]
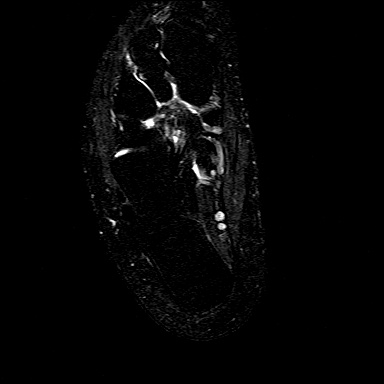
[im 15/30]
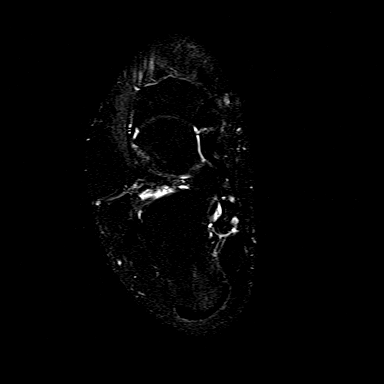
[im 19/30]
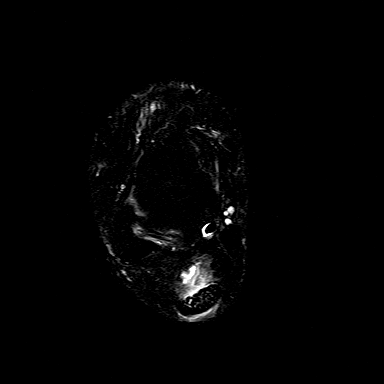
[im 22/30]
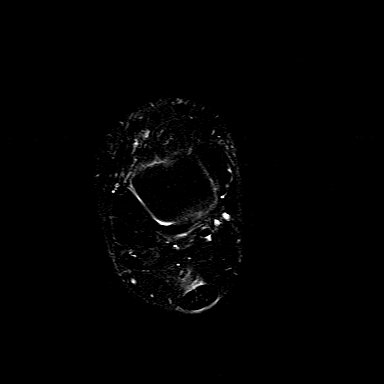
[im 26/30]
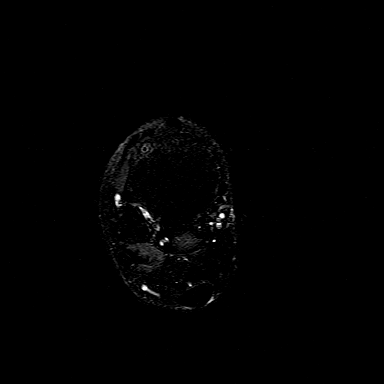
[im 30/30]
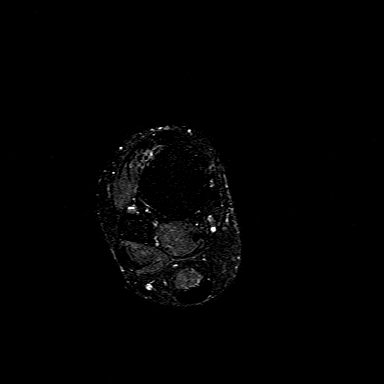

[Series 5: T2 fat-sat · coronal · 3.0mm · 0.47mm/px · 9 of 33 slices shown (2 of 3)]
[im 1/33]
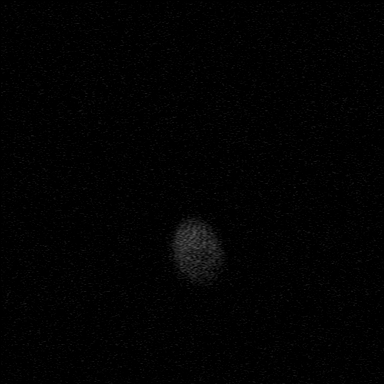
[im 5/33]
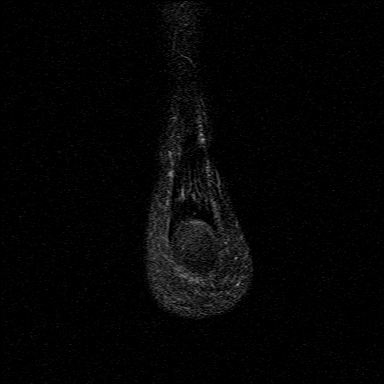
[im 9/33]
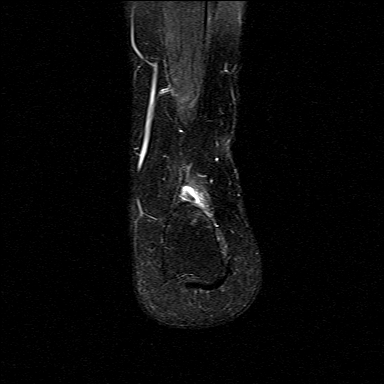
[im 13/33]
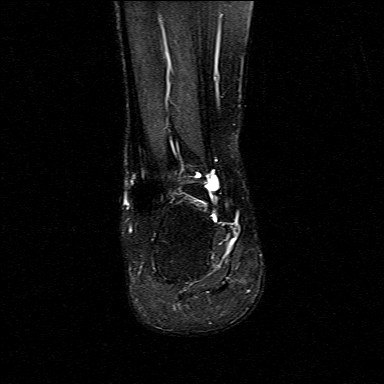
[im 17/33]
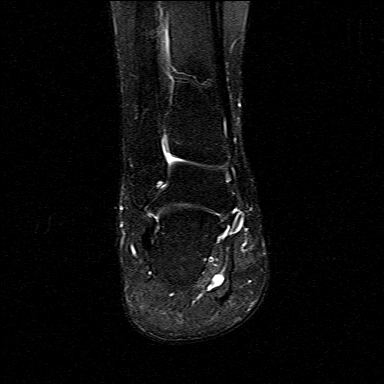
[im 21/33]
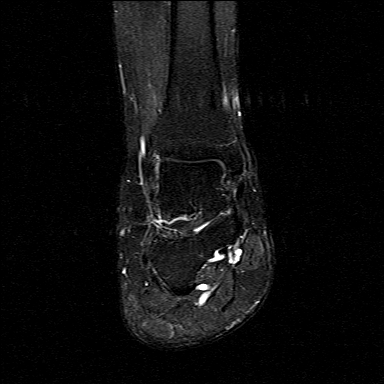
[im 25/33]
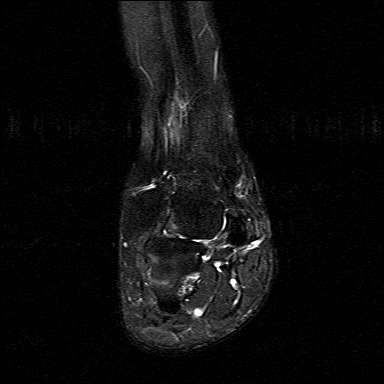
[im 29/33]
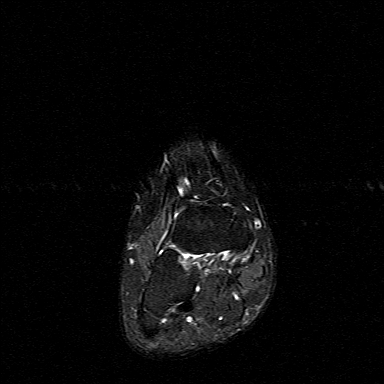
[im 33/33]
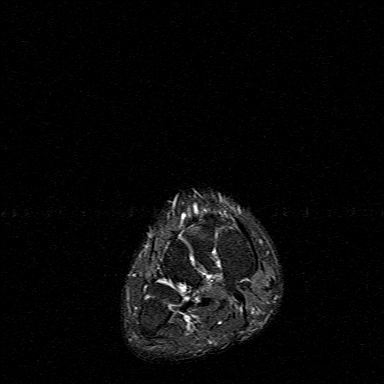

[Series 6: T1 · sagittal · 3.0mm · 0.56mm/px · 7 of 24 slices shown]
[im 1/24]
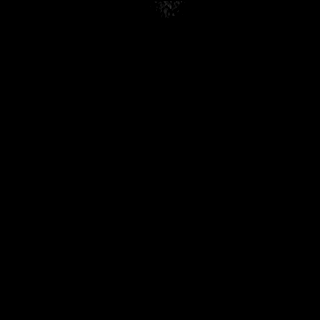
[im 4/24]
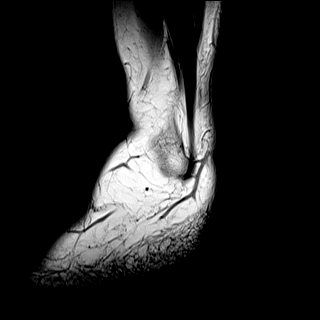
[im 8/24]
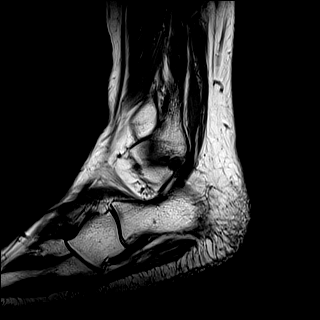
[im 12/24]
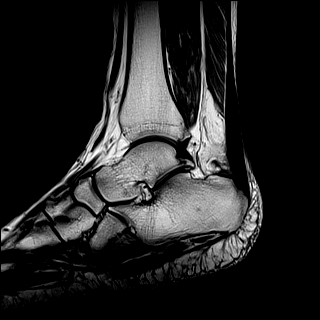
[im 16/24]
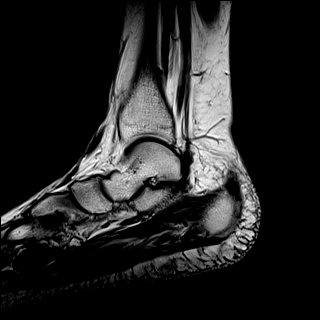
[im 20/24]
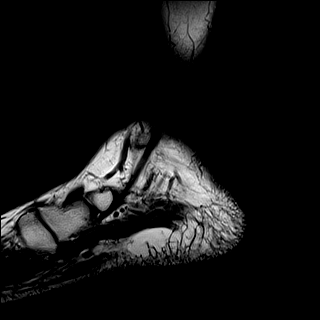
[im 24/24]
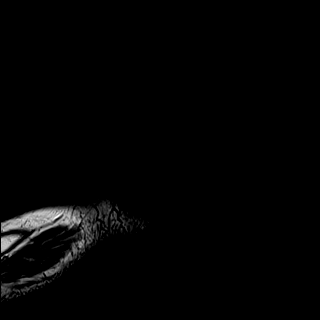

[Series 7: T2 fat-sat · sagittal · 3.0mm · 0.70mm/px · 7 of 24 slices shown (3 of 3)]
[im 1/24]
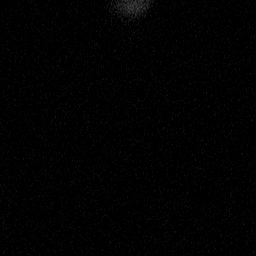
[im 4/24]
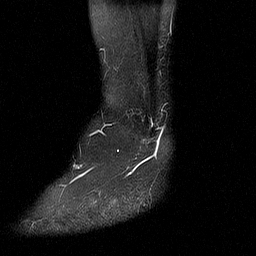
[im 8/24]
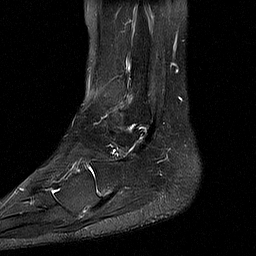
[im 12/24]
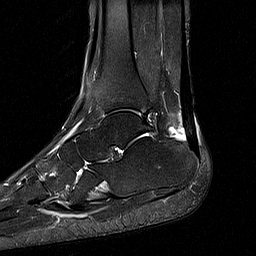
[im 16/24]
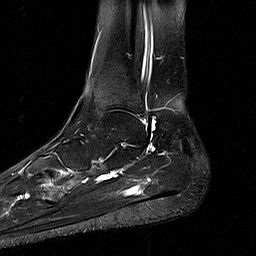
[im 20/24]
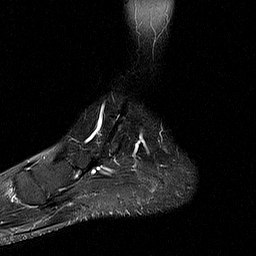
[im 24/24]
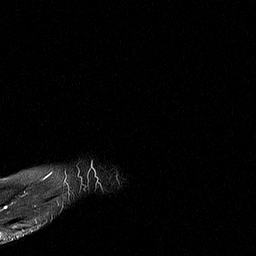

[40 of 40 positions shown; findings below may reference images not displayed]

FINDINGS: TENDONS

Peroneal: Small short-segment interstitial versus longitudinal split
tear of the peroneus longus at the level of the lateral malleolus.
Peroneal brevis intact.

Posteromedial: Posterior tibial tendon intact. Flexor hallucis
longus tendon intact. Flexor digitorum longus tendon intact.

Anterior: Tibialis anterior tendon intact. Extensor hallucis longus
tendon intact Extensor digitorum longus tendon intact.

Achilles: Moderate tendinosis of the distal Achilles tendon just
proximal to the insertion measuring approximately 18 mm in
craniocaudal dimension. Small interstitial tear of the Achilles
tendon. Mild peritenonitis and moderate retrocalcaneal bursitis.

Plantar Fascia: Intact.

LIGAMENTS

Lateral: Anterior talofibular ligament intact. Calcaneofibular
ligament intact. Posterior talofibular ligament intact. Anterior and
posterior tibiofibular ligaments intact.

Medial: Deltoid ligament intact. Spring ligament intact.

CARTILAGE

Ankle Joint: No joint effusion. Normal ankle mortise. No chondral
defect.

Subtalar Joints/Sinus Tarsi: Normal subtalar joints. No subtalar
joint effusion. Normal sinus tarsi.

Bones: No marrow signal abnormality.  No fracture or dislocation.

Soft Tissue: No fluid collection or hematoma.
IMPRESSION: 1. Moderate tendinosis of the distal Achilles tendon just proximal
to the insertion measuring approximately 18 mm in craniocaudal
dimension. Small interstitial tear of the Achilles tendon. Mild
peritenonitis and moderate retrocalcaneal bursitis.
2. Small short-segment interstitial versus longitudinal split tear
of the peroneus longus at the level of the lateral malleolus.

## 2018-07-22 ENCOUNTER — Encounter: Payer: Self-pay | Admitting: Internal Medicine

## 2018-07-22 ENCOUNTER — Ambulatory Visit: Payer: BLUE CROSS/BLUE SHIELD | Admitting: Internal Medicine

## 2018-07-22 ENCOUNTER — Encounter

## 2018-07-22 ENCOUNTER — Encounter (INDEPENDENT_AMBULATORY_CARE_PROVIDER_SITE_OTHER): Payer: Self-pay

## 2018-07-22 VITALS — BP 126/76 | HR 60 | Temp 98.7°F | Ht 64.0 in | Wt 209.4 lb

## 2018-07-22 DIAGNOSIS — J329 Chronic sinusitis, unspecified: Secondary | ICD-10-CM | POA: Diagnosis not present

## 2018-07-22 DIAGNOSIS — Z6835 Body mass index (BMI) 35.0-35.9, adult: Secondary | ICD-10-CM

## 2018-07-22 DIAGNOSIS — R739 Hyperglycemia, unspecified: Secondary | ICD-10-CM

## 2018-07-22 DIAGNOSIS — D509 Iron deficiency anemia, unspecified: Secondary | ICD-10-CM

## 2018-07-22 DIAGNOSIS — E559 Vitamin D deficiency, unspecified: Secondary | ICD-10-CM

## 2018-07-22 DIAGNOSIS — E049 Nontoxic goiter, unspecified: Secondary | ICD-10-CM | POA: Insufficient documentation

## 2018-07-22 DIAGNOSIS — I10 Essential (primary) hypertension: Secondary | ICD-10-CM

## 2018-07-22 DIAGNOSIS — R0982 Postnasal drip: Secondary | ICD-10-CM

## 2018-07-22 DIAGNOSIS — J302 Other seasonal allergic rhinitis: Secondary | ICD-10-CM

## 2018-07-22 DIAGNOSIS — Z1389 Encounter for screening for other disorder: Secondary | ICD-10-CM

## 2018-07-22 DIAGNOSIS — J040 Acute laryngitis: Secondary | ICD-10-CM

## 2018-07-22 DIAGNOSIS — E538 Deficiency of other specified B group vitamins: Secondary | ICD-10-CM

## 2018-07-22 DIAGNOSIS — R7989 Other specified abnormal findings of blood chemistry: Secondary | ICD-10-CM

## 2018-07-22 DIAGNOSIS — E041 Nontoxic single thyroid nodule: Secondary | ICD-10-CM | POA: Insufficient documentation

## 2018-07-22 DIAGNOSIS — F411 Generalized anxiety disorder: Secondary | ICD-10-CM

## 2018-07-22 DIAGNOSIS — R209 Unspecified disturbances of skin sensation: Secondary | ICD-10-CM

## 2018-07-22 MED ORDER — AZITHROMYCIN 250 MG PO TABS: ORAL_TABLET | ORAL | 0 refills | Status: DC

## 2018-07-22 MED ORDER — FLUTICASONE PROPIONATE 50 MCG/ACT NA SUSP
2.0000 | Freq: Every day | NASAL | 2 refills | Status: DC
Start: 2018-07-22 — End: 2018-10-17

## 2018-07-22 NOTE — Progress Notes (Signed)
Pre visit review using our clinic review tool, if applicable. No additional management support is needed unless otherwise documented below in the visit note. 

## 2018-07-22 NOTE — Patient Instructions (Addendum)
sch fasting labs asap  Try warm tea with Honey and lemon  Voice rest  F/u 4 weeks physical     Sinusitis, Adult Sinusitis is soreness and inflammation of your sinuses. Sinuses are hollow spaces in the bones around your face. Your sinuses are located:  Around your eyes.  In the middle of your forehead.  Behind your nose.  In your cheekbones.  Your sinuses and nasal passages are lined with a stringy fluid (mucus). Mucus normally drains out of your sinuses. When your nasal tissues become inflamed or swollen, the mucus can become trapped or blocked so air cannot flow through your sinuses. This allows bacteria, viruses, and funguses to grow, which leads to infection. Sinusitis can develop quickly and last for 7?10 days (acute) or for more than 12 weeks (chronic). Sinusitis often develops after a cold. What are the causes? This condition is caused by anything that creates swelling in the sinuses or stops mucus from draining, including:  Allergies.  Asthma.  Bacterial or viral infection.  Abnormally shaped bones between the nasal passages.  Nasal growths that contain mucus (nasal polyps).  Narrow sinus openings.  Pollutants, such as chemicals or irritants in the air.  A foreign object stuck in the nose.  A fungal infection. This is rare.  What increases the risk? The following factors may make you more likely to develop this condition:  Having allergies or asthma.  Having had a recent cold or respiratory tract infection.  Having structural deformities or blockages in your nose or sinuses.  Having a weak immune system.  Doing a lot of swimming or diving.  Overusing nasal sprays.  Smoking.  What are the signs or symptoms? The main symptoms of this condition are pain and a feeling of pressure around the affected sinuses. Other symptoms include:  Upper toothache.  Earache.  Headache.  Bad breath.  Decreased sense of smell and taste.  A cough that may get  worse at night.  Fatigue.  Fever.  Thick drainage from your nose. The drainage is often green and it may contain pus (purulent).  Stuffy nose or congestion.  Postnasal drip. This is when extra mucus collects in the throat or back of the nose.  Swelling and warmth over the affected sinuses.  Sore throat.  Sensitivity to light.  How is this diagnosed? This condition is diagnosed based on symptoms, a medical history, and a physical exam. To find out if your condition is acute or chronic, your health care provider may:  Look in your nose for signs of nasal polyps.  Tap over the affected sinus to check for signs of infection.  View the inside of your sinuses using an imaging device that has a light attached (endoscope).  If your health care provider suspects that you have chronic sinusitis, you may also:  Be tested for allergies.  Have a sample of mucus taken from your nose (nasal culture) and checked for bacteria.  Have a mucus sample examined to see if your sinusitis is related to an allergy.  If your sinusitis does not respond to treatment and it lasts longer than 8 weeks, you may have an MRI or CT scan to check your sinuses. These scans also help to determine how severe your infection is. In rare cases, a bone biopsy may be done to rule out more serious types of fungal sinus disease. How is this treated? Treatment for sinusitis depends on the cause and whether your condition is chronic or acute. If a virus  is causing your sinusitis, your symptoms will go away on their own within 10 days. You may be given medicines to relieve your symptoms, including:  Topical nasal decongestants. They shrink swollen nasal passages and let mucus drain from your sinuses.  Antihistamines. These drugs block inflammation that is triggered by allergies. This can help to ease swelling in your nose and sinuses.  Topical nasal corticosteroids. These are nasal sprays that ease inflammation and  swelling in your nose and sinuses.  Nasal saline washes. These rinses can help to get rid of thick mucus in your nose.  If your condition is caused by bacteria, you will be given an antibiotic medicine. If your condition is caused by a fungus, you will be given an antifungal medicine. Surgery may be needed to correct underlying conditions, such as narrow nasal passages. Surgery may also be needed to remove polyps. Follow these instructions at home: Medicines  Take, use, or apply over-the-counter and prescription medicines only as told by your health care provider. These may include nasal sprays.  If you were prescribed an antibiotic medicine, take it as told by your health care provider. Do not stop taking the antibiotic even if you start to feel better. Hydrate and Humidify  Drink enough water to keep your urine clear or pale yellow. Staying hydrated will help to thin your mucus.  Use a cool mist humidifier to keep the humidity level in your home above 50%.  Inhale steam for 10-15 minutes, 3-4 times a day or as told by your health care provider. You can do this in the bathroom while a hot shower is running.  Limit your exposure to cool or dry air. Rest  Rest as much as possible.  Sleep with your head raised (elevated).  Make sure to get enough sleep each night. General instructions  Apply a warm, moist washcloth to your face 3-4 times a day or as told by your health care provider. This will help with discomfort.  Wash your hands often with soap and water to reduce your exposure to viruses and other germs. If soap and water are not available, use hand sanitizer.  Do not smoke. Avoid being around people who are smoking (secondhand smoke).  Keep all follow-up visits as told by your health care provider. This is important. Contact a health care provider if:  You have a fever.  Your symptoms get worse.  Your symptoms do not improve within 10 days. Get help right away  if:  You have a severe headache.  You have persistent vomiting.  You have pain or swelling around your face or eyes.  You have vision problems.  You develop confusion.  Your neck is stiff.  You have trouble breathing. This information is not intended to replace advice given to you by your health care provider. Make sure you discuss any questions you have with your health care provider. Document Released: 10/30/2005 Document Revised: 06/25/2016 Document Reviewed: 08/25/2015 Elsevier Interactive Patient Education  2018 [place] Zoster (Shingles) Vaccine, RZV: What You Need to Know 1. Why get vaccinated? Shingles (also called herpes zoster, or just zoster) is a painful skin rash, often with blisters. Shingles is caused by the varicella zoster virus, the same virus that causes chickenpox. After you have chickenpox, the virus stays in your body and can cause shingles later in life. You can't catch shingles from another person. However, a person who has never had chickenpox (or chickenpox vaccine) could get chickenpox from someone with shingles. A  shingles rash usually appears on one side of the face or body and heals within 2 to 4 weeks. Its main symptom is pain, which can be severe. Other symptoms can include fever, headache, chills and upset stomach. Very rarely, a shingles infection can lead to pneumonia, hearing problems, blindness, brain inflammation (encephalitis), or death. For about 1 person in 5, severe pain can continue even long after the rash has cleared up. This long-lasting pain is called post-herpetic neuralgia (PHN). Shingles is far more common in people 31 years of age and older than in younger people, and the risk increases with age. It is also more common in people whose immune system is weakened because of a disease such as cancer, or by drugs such as steroids or chemotherapy. At least 1 million people a year in the [place] get shingles. 2. Shingles  vaccine (recombinant) Recombinant shingles vaccine was approved by FDA in 2017 for the prevention of shingles. In clinical trials, it was more than 90% effective in preventing shingles. It can also reduce the likelihood of PHN. Two doses, 2 to 6 months apart, are recommended for adults 59 and older. This vaccine is also recommended for people who have already gotten the live shingles vaccine (Zostavax). There is no live virus in this vaccine. 3. Some people should not get this vaccine Tell your vaccine provider if you:  Have any severe, life-threatening allergies. A person who has ever had a life-threatening allergic reaction after a dose of recombinant shingles vaccine, or has a severe allergy to any component of this vaccine, may be advised not to be vaccinated. Ask your health care provider if you want information about vaccine components.  Are pregnant or breastfeeding. There is not much information about use of recombinant shingles vaccine in pregnant or nursing women. Your healthcare provider might recommend delaying vaccination.  Are not feeling well. If you have a mild illness, such as a cold, you can probably get the vaccine today. If you are moderately or severely ill, you should probably wait until you recover. Your doctor can advise you.  4. Risks of a vaccine reaction With any medicine, including vaccines, there is a chance of reactions. After recombinant shingles vaccination, a person might experience:  Pain, redness, soreness, or swelling at the site of the injection  Headache, muscle aches, fever, shivering, fatigue  In clinical trials, most people got a sore arm with mild or moderate pain after vaccination, and some also had redness and swelling where they got the shot. Some people felt tired, had muscle pain, a headache, shivering, fever, stomach pain, or nausea. About 1 out of 6 people who got recombinant zoster vaccine experienced side effects that prevented them from doing  regular activities. Symptoms went away on their own in about 2 to 3 days. Side effects were more common in younger people. You should still get the second dose of recombinant zoster vaccine even if you had one of these reactions after the first dose. Other things that could happen after this vaccine:  People sometimes faint after medical procedures, including vaccination. Sitting or lying down for about 15 minutes can help prevent fainting and injuries caused by a fall. Tell your provider if you feel dizzy or have vision changes or ringing in the ears.  Some people get shoulder pain that can be more severe and longer-lasting than routine soreness that can follow injections. This happens very rarely.  Any medication can cause a severe allergic reaction. Such reactions to a vaccine  are estimated at about 1 in a million doses, and would happen within a few minutes to a few hours after the vaccination. As with any medicine, there is a very remote chance of a vaccine causing a serious injury or death. The safety of vaccines is always being monitored. For more information, visit: http://www.aguilar.org/ 5. What if there is a serious problem? What should I look for?  Look for anything that concerns you, such as signs of a severe allergic reaction, very high fever, or unusual behavior. Signs of a severe allergic reaction can include hives, swelling of the face and throat, difficulty breathing, a fast heartbeat, dizziness, and weakness. These would usually start a few minutes to a few hours after the vaccination. What should I do?  If you think it is a severe allergic reaction or other emergency that can't wait, call 9-1-1 and get to the nearest hospital. Otherwise, call your health care provider. Afterward, the reaction should be reported to the Vaccine Adverse Event Reporting System (VAERS). Your doctor should file this report, or you can do it yourself through the VAERS web site atwww.vaers.https://www.bray.com/  by calling (843)393-2327. VAERS does not give medical advice. 6. How can I learn more?  Ask your healthcare provider. He or she can give you the vaccine package insert or suggest other sources of information.  Call your local or state health department.  Contact the Centers for Disease Control and Prevention (CDC): ? Call 440-636-9048 (1-800-CDC-INFO) or ? Visit the CDC's website at http://hunter.com/ CDC Vaccine Information Statement (VIS) Recombinant Zoster Vaccine (12/25/2016) This information is not intended to replace advice given to you by your health care provider. Make sure you discus  Shin Splints Shin splints is a painful condition that is felt on the bone that is located in the front of the lower leg (tibia or shin bone) or in the muscles on either side of the bone. This condition happens when physical activities lead to inflammation of the muscles, tendons, and the thin layer that covers the shin bone. It may result from participating in sports or other demanding exercise. What are the causes? This condition may be caused by:  Overuse of muscles.  Repetitive activities.  Flat feet or rigid arches.  Activities that could contribute to shin splints include:  Having a sudden increase in exercise time.  Starting a new, demanding activity.  Running up hills or long distances.  Playing sports that involve sudden starts and stops.  Using a poor warm-up.  Wearing old or worn-out shoes.  What are the signs or symptoms? Symptoms of this condition include:  Pain on the front of the lower leg.  Pain while exercising or at rest.  How is this diagnosed? This condition may be diagnosed based on a physical exam and the history of your symptoms. Your health care provider may observe you as you walk or run. You may also have X-rays or other imaging tests to rule out other problems that could cause lower leg pain, such as a stress fracture. How is this  treated? Treatment for this condition may depend on your age, history, health, and how bad the pain is. Most cases of shin splints can be managed by doing one or more of the following:  Resting.  Reducing the length and intensity of your exercise.  Stopping the activity that causes shin pain.  Taking medicines to control the inflammation.  Icing, massaging, stretching, and strengthening the affected area.  Wearing shoes that have rigid heels, shock  absorption, and a good arch support.  Follow these instructions at home: Injury care  If directed, apply ice to the injured area: ? Put ice in a plastic bag. ? Place a towel between your skin and the bag. ? Leave the ice on for 20 minutes, 2-3 times a day.  Massage, stretch, and strengthen the affected area as directed by your health care provider. Activity  Rest as needed. Return to activity gradually as directed by your health care provider.  Restart your exercise sessions with non-weight-bearing exercises, such as cycling or swimming.  Stop running if the pain returns.  Warm up properly before exercising.  Run on a surface that is level and fairly firm.  Gradually change the intensity of an exercise.  If you increase your running distance, add only 5-10% to your distance each week. This means that if you are running 5 miles this week, you should only increase your run by - mile for next week. General instructions  Take medicines only as directed by your health care provider.  Wear shoes that have rigid heels, shock absorption, and a good arch support.  Change your athletic shoes every 6 months, or every 350-450 miles. Contact a health care provider if:  Your symptoms continue or they worsen even after treatment.  The location, intensity, or type of pain changes over time.  You have swelling in your lower leg that gets worse.  Your shin becomes red and feels warm. Get help right away if:  You have severe  pain.  You have trouble walking. This information is not intended to replace advice given to you by your health care provider. Make sure you discuss any questions you have with your health care provider. Document Released: 10/27/2000 Document Revised: 06/24/2016 Document Reviewed: 10/26/2014 Elsevier Interactive Patient Education  2018 [employer]. s any questions you have with your health care provider. Document Released: 01/09/2017 Document Revised: 01/09/2017 Document Reviewed: 01/09/2017 Elsevier Interactive Patient Education  2018 [employer]    Laryngitis Laryngitis is inflammation of your vocal cords. This causes hoarseness, coughing, loss of voice, sore throat, or a dry throat. Your vocal cords are two bands of muscles that are found in your throat. When you speak, these cords come together and vibrate. These vibrations come out through your mouth as sound. When your vocal cords are inflamed, your voice sounds different. Laryngitis can be temporary (acute) or long-term (chronic). Most cases of acute laryngitis improve with time. Chronic laryngitis is laryngitis that lasts for more than three weeks. What are the causes? Acute laryngitis may be caused by:  A viral infection.  Lots of talking, yelling, or singing. This is also called vocal strain.  Bacterial infections.  Chronic laryngitis may be caused by:  Vocal strain.  Injury to your vocal cords.  Acid reflux (gastroesophageal reflux disease or GERD).  Allergies.  Sinus infection.  Smoking.  Alcohol abuse.  Breathing in chemicals or dust.  Growths on the vocal cords.  What increases the risk? Risk factors for laryngitis include:  Smoking.  Alcohol abuse.  Having allergies.  What are the signs or symptoms? Symptoms of laryngitis may include:  Low, hoarse voice.  Loss of voice.  Dry cough.  Sore throat.  Stuffy nose.  How is this diagnosed? Laryngitis may be diagnosed by:  Physical  exam.  Throat culture.  Blood test.  Laryngoscopy. This procedure allows your health care provider to look at your vocal cords with a mirror or viewing tube.  How is  this treated? Treatment for laryngitis depends on what is causing it. Usually, treatment involves resting your voice and using medicines to soothe your throat. However, if your laryngitis is caused by a bacterial infection, you may need to take antibiotic medicine. If your laryngitis is caused by a growth, you may need to have a procedure to remove it. Follow these instructions at home:  Drink enough fluid to keep your urine clear or pale yellow.  Breathe in moist air. Use a humidifier if you live in a dry climate.  Take medicines only as directed by your health care provider.  If you were prescribed an antibiotic medicine, finish it all even if you start to feel better.  Do not smoke cigarettes or electronic cigarettes. If you need help quitting, ask your health care provider.  Talk as little as possible. Also avoid whispering, which can cause vocal strain.  Write instead of talking. Do this until your voice is back to normal. Contact a health care provider if:  You have a fever.  You have increasing pain.  You have difficulty swallowing. Get help right away if:  You cough up blood.  You have trouble breathing. This information is not intended to replace advice given to you by your health care provider. Make sure you discuss any questions you have with your health care provider. Document Released: 10/30/2005 Document Revised: 04/06/2016 Document Reviewed: 04/14/2014 Elsevier Interactive Patient Education  [employer].

## 2018-07-23 ENCOUNTER — Telehealth: Payer: Self-pay | Admitting: Internal Medicine

## 2018-07-23 ENCOUNTER — Encounter: Payer: Self-pay | Admitting: Internal Medicine

## 2018-07-23 NOTE — Progress Notes (Signed)
The patient stated that she will have them done at the hospital before her next visit.

## 2018-07-23 NOTE — Telephone Encounter (Signed)
From Dr. Marcelline Mates   Please inform patient that I informed her at her visit that since she did not have a cervix due to having a hysterectomy any more, she no longer needed cervical cancer screens with pap smears. She still does need to have a pelvic exam every year, where we look inside the vagina and feel for the ovaries, which is what she had done at her most recent annual exam.   Inform pt no pap done but did pelvic exam    Dr. Marcelline Mates

## 2018-07-23 NOTE — Progress Notes (Signed)
Chief Complaint  Patient presents with  . Establish Care   NP  1. C/o Friday felt fatigue night and sat had hoarse voice she has not had the flu shot yet. Also had dry cough b/l ear pain, sore throat and chills and loss of voice. No fever. Saturday her body hurt and right ear and c/o sinus pressure. Tried Alkaseltzer plus  2. H/o enlarged thyroid and thyroid nodules per [place] 11/03/15 no f/u since  3. H/o colon polyps need to get results from [facility] see A/P 4. C/o warm sensation w/o pain in left shin intermittent x few weeks on and off she is s/p right foot surgery   Review of Systems  Constitutional: Positive for chills and malaise/fatigue. Negative for fever.  HENT: Positive for ear pain and sinus pain. Negative for hearing loss.        +loss of voice   Eyes: Negative for blurred vision.  Respiratory: Negative for shortness of breath.   Cardiovascular: Negative for chest pain.  Gastrointestinal: Negative for abdominal pain.  Musculoskeletal: Negative for myalgias.  Skin: Negative for rash.  Neurological: Positive for sensory change.       Warm sensation left shin  Psychiatric/Behavioral: Negative for depression.   Past Medical History:  Diagnosis Date  . Abnormal thyroid function test   . Anxiety   . 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 Colon polyp 2014  . Enlarged thyroid   . Hypertension   . UTI (urinary tract infection)    Past Surgical History:  Procedure Laterality Date  . ABDOMINAL HYSTERECTOMY     2009  . BREAST BIOPSY Right 2003  . CARPAL TUNNEL RELEASE    . TONSILLECTOMY     1987  . UNILATERAL SALPINGECTOMY     left   Family History  Problem Relation Age of Onset  . Hypertension Mother   . Hyperlipidemia Mother   . Hypertension Father   . Breast cancer Neg Hx   . Ovarian cancer Neg Hx   . Stroke Neg Hx   . Thyroid disease Neg Hx    Social History   Socioeconomic History  . Marital status: Married    Spouse name: Not on file  .  Number of children: Not on file  . Years of education: Not on file  . Highest education level: Not on file  Occupational History  . Not on file  Social Needs  . Financial resource strain: Not on file  . Food insecurity:    Worry: Not on file    Inability: Not on file  . Transportation needs:    Medical: Not on file    Non-medical: Not on file  Tobacco Use  . Smoking status: Former Smoker    Packs/day: 0.25  . Smokeless tobacco: Never Used  Substance and Sexual Activity  . Alcohol use: Yes    Comment: socially  . Drug use: No  . Sexual activity: Yes    Birth control/protection: Surgical  Lifestyle  . Physical activity:    Days per week: Not on file    Minutes per session: Not on file  . Stress: Not on file  Relationships  . Social connections:    Talks on phone: Not on file    Gets together: Not on file    Attends religious service: Not on file    Active member of club or organization: Not on file    Attends meetings of clubs or organizations: Not on file  Relationship status: Not on file  . Intimate partner violence:    Fear of current or ex partner: Not on file    Emotionally abused: Not on file    Physically abused: Not on file    Forced sexual activity: Not on file  Other Topics Concern  . 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 ALPRAZolam (XANAX) 0.25 MG tablet Take 0.25 mg by mouth at bedtime as needed.   Marland Kitchen escitalopram (LEXAPRO) 10 MG tablet Take 10 mg by mouth daily.  Marland Kitchen levocetirizine (XYZAL) 5 MG tablet TAKE 1 TABLET EVERY EVENING  . montelukast (SINGULAIR) 10 MG tablet Take 10 mg by mouth at bedtime.  . Multiple Vitamin (MULTIVITAMIN) capsule Take by mouth daily.   Marland Kitchen olmesartan-hydrochlorothiazide (BENICAR HCT) 20-12.5 MG tablet 1  tablet daily.   . pantoprazole (PROTONIX) 40 MG tablet Take 40 mg by mouth daily.  . potassium chloride (K-DUR) 10 MEQ tablet Take by mouth daily.   Marland Kitchen thiamine 250 MG tablet Take 250 mg by mouth daily.  . Vitamin D, Ergocalciferol, (DRISDOL) 50000 units CAPS capsule TAKE ONE CAPSULE BY MOUTH ONE TIME PER WEEK  . [DISCONTINUED] mometasone (NASONEX) 50 MCG/ACT nasal spray SPRAY 2 SPRAYS INTO EACH NOSTRIL EVERY DAY   Allergies  Allergen Reactions  . Losartan Potassium-Hctz     Other reaction(s): Other (See Comments) Weight gain, constipation  . Tape Rash   No results found for this or any previous visit (from the past 2160 hour(s)). Objective  Body mass index is 35.94 kg/m. Wt Readings from Last 3 Encounters:  07/22/18 209 lb 6.4 oz (95 kg)  04/09/18 207 lb 1.6 oz (93.9 kg)  04/04/17 207 lb 12.8 oz (94.3 kg)   Temp Readings from Last 3 Encounters:  07/22/18 98.7 F (37.1 C) (Oral)  10/18/16 98.3 F (36.8 C)  03/27/14 98 F (36.7 C) (Oral)   BP Readings from Last 3 Encounters:  07/22/18 126/76  04/09/18 90/60  04/04/17 (!) 95/58   Pulse Readings from Last 3 Encounters:  07/22/18 60  04/09/18 65  04/04/17 73    Physical Exam  Constitutional: She is oriented to person, place, and time. Vital signs are normal. She appears well-developed and well-nourished. She is cooperative.  HENT:  Head: Normocephalic and atraumatic.  Nose: Right sinus exhibits maxillary sinus tenderness and frontal sinus tenderness. Left sinus exhibits maxillary sinus tenderness and frontal sinus tenderness.  Mouth/Throat: Oropharynx is clear and moist and mucous membranes are normal.  Mild fluid b/l ear   Eyes: Pupils are equal, round, and reactive to light. Conjunctivae are normal.  Cardiovascular: Normal rate, regular rhythm and normal heart sounds.  Pulmonary/Chest: Effort normal and breath sounds normal.  Neurological: She is alert and oriented to person, place, and time. Gait normal.  Sensation  intact left shin  Skin: Skin is warm, dry and intact.  Psychiatric: She has a normal mood and affect. Her speech is normal and behavior is normal. Judgment and thought content normal. Cognition and memory are normal.  Nursing note and vitals reviewed.   Assessment   1. HTN  2. Laryngitis/sinusitis/fluid in ears  3. Thyroid nodules/goiter and  abnormal thyroid labs  4. Abnormal sensation left shin  5. HM Plan   1. Cont meds  sch fasting labs  2. Voice rest  Zpack Prn xyzal  Change nasonex to flonase  3. Thyroid US ordered and labs to f/u  4.labs to f/u  If neg consider MRI brain to further w/u vs neurology  5.  Flu shot due  Tdap had 03/30/17 Consider pna 23 smoker per pt had in 2016 no record  Consider shingrix disc'ed today   Physical at f/u  Pap appears not done saw Dr. Marcelline Mates 04/09/18 per pt had pap per but Dr. 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 Olivia Mackie McLean-Scocuzza-Internal Medicine

## 2018-07-23 NOTE — Telephone Encounter (Signed)
-----   Message from Rubie Maid, MD sent at 07/23/2018  3:45 PM EDT ----- Please inform patient that I informed her at her visit that since she did not have a cervix due to having a hysterectomy any more, she no longer needed cervical cancer screens with pap smears.  She still does need to have a pelvic exam every year, where we look inside the vagina and feel for the ovaries, which is what she had done at her most recent annual exam.   Dr. Marcelline Mates ----- Message ----- From: McLean-Scocuzza, Nino Glow, MD Sent: 07/23/2018   1:28 PM EDT To: Rubie Maid, MD  Saw this pt she is thinking she had pap 04/09/18 per your notes no pap was done Does she need further paps seems like no no h/o abnormal and s/p hysterectomy   Thanks [place]

## 2018-07-24 ENCOUNTER — Other Ambulatory Visit: Payer: Self-pay

## 2018-07-24 MED ORDER — POTASSIUM CHLORIDE ER 10 MEQ PO TBCR: 10.0000 meq | EXTENDED_RELEASE_TABLET | Freq: Every day | ORAL | 0 refills | Status: DC

## 2018-07-24 MED ORDER — OLMESARTAN MEDOXOMIL-HCTZ 20-12.5 MG PO TABS: 1.0000 | ORAL_TABLET | Freq: Every day | ORAL | 1 refills | Status: DC

## 2018-07-24 MED ORDER — LEVOCETIRIZINE DIHYDROCHLORIDE 5 MG PO TABS: 5.0000 mg | ORAL_TABLET | Freq: Every evening | ORAL | 1 refills | Status: DC

## 2018-07-24 MED ORDER — ESCITALOPRAM OXALATE 10 MG PO TABS: 10.0000 mg | ORAL_TABLET | Freq: Every day | ORAL | 1 refills | Status: DC

## 2018-07-24 MED ORDER — PANTOPRAZOLE SODIUM 40 MG PO TBEC: 40.0000 mg | DELAYED_RELEASE_TABLET | Freq: Every day | ORAL | 1 refills | Status: DC

## 2018-07-24 NOTE — Telephone Encounter (Signed)
Patient has been notified

## 2018-07-25 ENCOUNTER — Other Ambulatory Visit: Payer: Self-pay | Admitting: Internal Medicine

## 2018-07-25 ENCOUNTER — Telehealth: Payer: Self-pay | Admitting: Internal Medicine

## 2018-07-25 NOTE — Addendum Note (Signed)
Addended by: Orland Mustard on: 07/25/2018 05:56 PM   Modules accepted: Orders

## 2018-07-25 NOTE — Telephone Encounter (Signed)
[facility] Olmesartan hctz 20-12.5 her blood pressure medication is on back order  Does she want to try a different pharmacy  OR  A different combination medication?   Kelly Pittman

## 2018-07-26 ENCOUNTER — Other Ambulatory Visit: Payer: Self-pay | Admitting: Internal Medicine

## 2018-07-26 DIAGNOSIS — I10 Essential (primary) hypertension: Secondary | ICD-10-CM

## 2018-07-26 MED ORDER — HYDROCHLOROTHIAZIDE 12.5 MG PO TABS: 12.5000 mg | ORAL_TABLET | Freq: Every day | ORAL | 3 refills | Status: DC

## 2018-07-26 MED ORDER — TELMISARTAN 20 MG PO TABS: 20.0000 mg | ORAL_TABLET | Freq: Every day | ORAL | 3 refills | Status: DC

## 2018-07-26 NOTE — Telephone Encounter (Signed)
Did you call her about this?

## 2018-07-26 NOTE — Telephone Encounter (Signed)
Unable to find combination medication that is the comparable in the dose she was on she had to do 2 pills  telmisartan 20 mg and hctz 12.5 mg take daily in am  Inform pt   Kelly Pittman

## 2018-07-26 NOTE — Telephone Encounter (Signed)
Yes she would like to change medication and keep it at [facility].

## 2018-07-29 ENCOUNTER — Telehealth: Payer: Self-pay | Admitting: Internal Medicine

## 2018-07-29 NOTE — Telephone Encounter (Signed)
Sent in telmisartan 20 mg and hctz 12.5 07/26/18  They dont make combinations of this medication   Call [facility] ansd make sure this went through this is what I want her to take That dose of losartan would be too high and they done make a 25-12.5 losartan-hctz   [place]

## 2018-07-29 NOTE — Telephone Encounter (Signed)
rx change  

## 2018-07-29 NOTE — Telephone Encounter (Signed)
Copied from [place] [phone]. Topic: Quick Communication - See Telephone Encounter >> Jul 29, 2018 10:27 AM Conception Chancy, NT wrote: CRM for notification. See Telephone encounter for: 07/29/18.  [facility] and states that olmesartan-hydrochlorothiazide (BENICAR HCT) 20-12.5 MG tablet has been back ordered and a replacement is needing to be called in. [facility] Losartan HTCZ 50-12.5 mg tablet. Please advise as she states this is the 3rd and final attempt.   Cb# [phone] Fax# [phone] Reference# [id number]

## 2018-08-02 NOTE — Telephone Encounter (Signed)
Patient has been informed.

## 2018-08-08 ENCOUNTER — Ambulatory Visit: Payer: BLUE CROSS/BLUE SHIELD

## 2018-08-13 ENCOUNTER — Other Ambulatory Visit: Payer: BLUE CROSS/BLUE SHIELD

## 2018-08-14 ENCOUNTER — Ambulatory Visit
Admission: RE | Admit: 2018-08-14 | Discharge: 2018-08-14 | Disposition: A | Discharge status: Home / Self Care | Payer: BLUE CROSS/BLUE SHIELD | Source: Ambulatory Visit | Attending: Internal Medicine | Admitting: Internal Medicine

## 2018-08-14 DIAGNOSIS — E041 Nontoxic single thyroid nodule: Secondary | ICD-10-CM | POA: Diagnosis not present

## 2018-08-16 ENCOUNTER — Other Ambulatory Visit
Admission: RE | Admit: 2018-08-16 | Discharge: 2018-08-16 | Disposition: A | Discharge status: Home / Self Care | Payer: BLUE CROSS/BLUE SHIELD | Source: Ambulatory Visit | Attending: Internal Medicine | Admitting: Internal Medicine

## 2018-08-16 DIAGNOSIS — D509 Iron deficiency anemia, unspecified: Secondary | ICD-10-CM | POA: Diagnosis not present

## 2018-08-16 DIAGNOSIS — R7989 Other specified abnormal findings of blood chemistry: Secondary | ICD-10-CM | POA: Diagnosis not present

## 2018-08-16 DIAGNOSIS — R209 Unspecified disturbances of skin sensation: Secondary | ICD-10-CM | POA: Insufficient documentation

## 2018-08-16 DIAGNOSIS — I10 Essential (primary) hypertension: Secondary | ICD-10-CM | POA: Insufficient documentation

## 2018-08-16 DIAGNOSIS — E041 Nontoxic single thyroid nodule: Secondary | ICD-10-CM | POA: Insufficient documentation

## 2018-08-16 DIAGNOSIS — E559 Vitamin D deficiency, unspecified: Secondary | ICD-10-CM

## 2018-08-16 DIAGNOSIS — E538 Deficiency of other specified B group vitamins: Secondary | ICD-10-CM | POA: Insufficient documentation

## 2018-08-16 DIAGNOSIS — R739 Hyperglycemia, unspecified: Secondary | ICD-10-CM | POA: Insufficient documentation

## 2018-08-16 LAB — COMPREHENSIVE METABOLIC PANEL
ALT: 14 U/L (ref 0–44)
AST: 19 U/L (ref 15–41)
Albumin: 3.9 g/dL (ref 3.5–5.0)
Alkaline Phosphatase: 94 U/L (ref 38–126)
Anion gap: 9 (ref 5–15)
BILIRUBIN TOTAL: 1 mg/dL (ref 0.3–1.2)
BUN: 11 mg/dL (ref 6–20)
CO2: 31 mmol/L (ref 22–32)
Calcium: 9.3 mg/dL (ref 8.9–10.3)
Chloride: 102 mmol/L (ref 98–111)
Creatinine, Ser: 0.78 mg/dL (ref 0.44–1.00)
GFR calc Af Amer: >60 mL/min (ref >60)
Glucose, Bld: 83 mg/dL (ref 70–99)
POTASSIUM: 2.9 mmol/L — AB (ref 3.5–5.1)
Sodium: 142 mmol/L (ref 135–145)
TOTAL PROTEIN: 7.2 g/dL (ref 6.5–8.1)

## 2018-08-16 LAB — T4, FREE: Free T4: 0.81 ng/dL — ABNORMAL LOW (ref 0.82–1.77)

## 2018-08-16 LAB — CBC WITH DIFFERENTIAL/PLATELET
Basophils Absolute: 0.1 10*3/uL (ref 0–0.1)
Basophils Relative: 1 %
Eosinophils Absolute: 0.2 10*3/uL (ref 0–0.7)
Eosinophils Relative: 3 %
HCT: 36.4 % (ref 35.0–47.0)
Hemoglobin: 12.6 g/dL (ref 12.0–16.0)
LYMPHS ABS: 2.9 10*3/uL (ref 1.0–3.6)
LYMPHS PCT: 50 %
MCH: 31.4 pg (ref 26.0–34.0)
MCHC: 34.5 g/dL (ref 32.0–36.0)
MCV: 91 fL (ref 80.0–100.0)
MONOS PCT: 5 %
Monocytes Absolute: 0.3 10*3/uL (ref 0.2–0.9)
NEUTROS ABS: 2.4 10*3/uL (ref 1.4–6.5)
Neutrophils Relative %: 41 %
Platelets: 303 10*3/uL (ref 150–440)
RBC: 4 MIL/uL (ref 3.80–5.20)
RDW: 13.3 % (ref 11.5–14.5)
WBC: 5.8 10*3/uL (ref 3.6–11.0)

## 2018-08-16 LAB — LIPID PANEL
CHOLESTEROL: 185 mg/dL (ref 0–200)
HDL: 43 mg/dL (ref >40)
LDL Cholesterol: 122 mg/dL — ABNORMAL HIGH (ref 0–99)
TRIGLYCERIDES: 102 mg/dL (ref <150)
Total CHOL/HDL Ratio: 4.3 RATIO
VLDL: 20 mg/dL (ref 0–40)

## 2018-08-16 LAB — MAGNESIUM: Magnesium: 2 mg/dL (ref 1.7–2.4)

## 2018-08-16 LAB — IRON AND TIBC
Iron: 117 ug/dL (ref 28–170)
Saturation Ratios: 38 % — ABNORMAL HIGH (ref 10.4–31.8)
TIBC: 308 ug/dL (ref 250–450)
UIBC: 191 ug/dL

## 2018-08-16 LAB — FERRITIN: FERRITIN: 47 ng/mL (ref 11–307)

## 2018-08-16 LAB — VITAMIN B12: Vitamin B-12: 500 pg/mL (ref 180–914)

## 2018-08-16 LAB — TSH: TSH: 0.662 u[IU]/mL (ref 0.350–4.500)

## 2018-08-17 LAB — T3, FREE: T3, Free: 3.2 pg/mL (ref 2.0–4.4)

## 2018-08-17 LAB — VITAMIN D 25 HYDROXY (VIT D DEFICIENCY, FRACTURES): Vit D, 25-Hydroxy: 59.5 ng/mL (ref 30.0–100.0)

## 2018-08-19 ENCOUNTER — Other Ambulatory Visit: Payer: Self-pay | Admitting: Internal Medicine

## 2018-08-19 DIAGNOSIS — E876 Hypokalemia: Secondary | ICD-10-CM

## 2018-08-19 DIAGNOSIS — Z23 Encounter for immunization: Secondary | ICD-10-CM | POA: Diagnosis not present

## 2018-08-19 LAB — HEMOGLOBIN A1C
Hgb A1c MFr Bld: 5.6 % (ref 4.8–5.6)
Mean Plasma Glucose: 114.02 mg/dL

## 2018-08-19 MED ORDER — POTASSIUM CHLORIDE ER 20 MEQ PO TBCR: EXTENDED_RELEASE_TABLET | ORAL | 3 refills | Status: DC

## 2018-08-21 ENCOUNTER — Ambulatory Visit (INDEPENDENT_AMBULATORY_CARE_PROVIDER_SITE_OTHER): Payer: BLUE CROSS/BLUE SHIELD | Admitting: Internal Medicine

## 2018-08-21 ENCOUNTER — Encounter: Payer: Self-pay | Admitting: Internal Medicine

## 2018-08-21 VITALS — BP 98/60 | HR 62 | Temp 98.4°F | Ht 64.0 in | Wt 208.6 lb

## 2018-08-21 DIAGNOSIS — Z0000 Encounter for general adult medical examination without abnormal findings: Secondary | ICD-10-CM

## 2018-08-21 DIAGNOSIS — I10 Essential (primary) hypertension: Secondary | ICD-10-CM

## 2018-08-21 DIAGNOSIS — M2550 Pain in unspecified joint: Secondary | ICD-10-CM

## 2018-08-21 DIAGNOSIS — Z1389 Encounter for screening for other disorder: Secondary | ICD-10-CM

## 2018-08-21 DIAGNOSIS — E049 Nontoxic goiter, unspecified: Secondary | ICD-10-CM

## 2018-08-21 DIAGNOSIS — E876 Hypokalemia: Secondary | ICD-10-CM

## 2018-08-21 DIAGNOSIS — Z6835 Body mass index (BMI) 35.0-35.9, adult: Secondary | ICD-10-CM

## 2018-08-21 DIAGNOSIS — E041 Nontoxic single thyroid nodule: Secondary | ICD-10-CM

## 2018-08-21 DIAGNOSIS — R0683 Snoring: Secondary | ICD-10-CM

## 2018-08-21 DIAGNOSIS — R400 Somnolence: Secondary | ICD-10-CM

## 2018-08-21 DIAGNOSIS — E785 Hyperlipidemia, unspecified: Secondary | ICD-10-CM | POA: Insufficient documentation

## 2018-08-21 HISTORY — DX: Encounter for general adult medical examination without abnormal findings: Z00.00

## 2018-08-21 NOTE — Progress Notes (Signed)
Pre visit review using our clinic review tool, if applicable. No additional management support is needed unless otherwise documented below in the visit note. 

## 2018-08-21 NOTE — Progress Notes (Signed)
Chief Complaint  Patient presents with  . Annual Exam   Annual  1. HTN low normal today on hctz 12.5 mg qd micardis 20 mg qd benicar 20-12.5 on back order. No sx's dizziness  She has low K will repeat today  2. HLD given cholesterol handout today will monitor for now  3. Arthralgia pt wants RA labs again will do today   Review of Systems  Constitutional: Positive for weight loss.       Down few lbs trying   HENT: Negative for hearing loss.   Eyes: Negative for blurred vision.  Respiratory: Negative for shortness of breath.   Cardiovascular: Negative for chest pain.  Gastrointestinal: Negative for abdominal pain.  Musculoskeletal: Negative for falls.  Skin: Negative for rash.  Neurological: Negative for dizziness.  Psychiatric/Behavioral: Negative for depression.   Past Medical History:  Diagnosis Date  . Abnormal thyroid function test   . Anxiety   . 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 Colon polyp 2014  . Enlarged thyroid   . Hypertension   . UTI (urinary tract infection)    Past Surgical History:  Procedure Laterality Date  . ABDOMINAL HYSTERECTOMY     2009  . BREAST BIOPSY Right 2003  . CARPAL TUNNEL RELEASE    . FOOT SURGERY     right  . TONSILLECTOMY     1987  . UNILATERAL SALPINGECTOMY     left   Family History  Problem Relation Age of Onset  . Hypertension Mother   . Hyperlipidemia Mother   . Hypertension Father   . Breast cancer Neg Hx   . Ovarian cancer Neg Hx   . Stroke Neg Hx   . Thyroid disease Neg Hx    Social History   Socioeconomic History  . Marital status: Married    Spouse name: Not on file  . Number of children: Not on file  . Years of education: Not on file  . Highest education level: Not on file  Occupational History  . Not on file  Social Needs  . Financial resource strain: Not on file  . Food insecurity:    Worry: Not on file    Inability: Not on file  . Transportation needs:    Medical: Not on  file    Non-medical: Not on file  Tobacco Use  . Smoking status: Former Smoker    Packs/day: 0.25  . Smokeless tobacco: Never Used  Substance and Sexual Activity  . Alcohol use: Yes    Comment: socially  . Drug use: No  . Sexual activity: Yes    Birth control/protection: Surgical  Lifestyle  . Physical activity:    Days per week: Not on file    Minutes per session: Not on file  . Stress: Not on file  Relationships  . Social connections:    Talks on phone: Not on file    Gets together: Not on file    Attends religious service: Not on file    Active member of club or organization: Not on file    Attends meetings of clubs or organizations: Not on file    Relationship status: Not on file  . Intimate partner violence:    Fear of current or ex partner: Not on file    Emotionally abused: Not on file    Physically abused: Not on file    Forced sexual activity: Not on file  Other Topics Concern  . 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 ALPRAZolam (XANAX) 0.25 MG tablet Take 0.25 mg by mouth at bedtime as needed.   Marland Kitchen azithromycin (ZITHROMAX) 250 MG tablet 2 pills day 1 and 1 pills day 2-5 with food  . escitalopram (LEXAPRO) 10 MG tablet Take 1 tablet (10 mg total) by mouth daily.  . fluticasone (FLONASE) 50 MCG/ACT nasal spray Place 2 sprays into both nostrils daily. Max each nose per day  . hydrochlorothiazide (HYDRODIURIL) 12.5 MG tablet Take 1 tablet (12.5 mg total) by mouth daily. In am  . levocetirizine (XYZAL) 5 MG tablet Take 1 tablet (5 mg total) by mouth every evening.  . montelukast (SINGULAIR) 10 MG tablet Take 10 mg by mouth at bedtime.  . Multiple Vitamin (MULTIVITAMIN) capsule Take by mouth daily.   . Nutritional Supplements (ESTROVEN PO) Take  by mouth.  . olmesartan-hydrochlorothiazide (BENICAR HCT) 20-12.5 MG tablet Take 1 tablet by mouth daily.  . pantoprazole (PROTONIX) 40 MG tablet Take 1 tablet (40 mg total) by mouth daily.  . potassium chloride 20 MEQ TBCR Bid x 3 days then 20 meq daily  . telmisartan (MICARDIS) 20 MG tablet Take 1 tablet (20 mg total) by mouth daily.  Marland Kitchen thiamine 250 MG tablet Take 250 mg by mouth daily.  . Vitamin D, Ergocalciferol, (DRISDOL) 50000 units CAPS capsule TAKE ONE CAPSULE BY MOUTH ONE TIME PER WEEK   Allergies  Allergen Reactions  . Losartan Potassium-Hctz     Other reaction(s): Other (See Comments) Weight gain, constipation  . Tape Rash   Recent Results (from the past 2160 hour(s))  Ferritin     Status: None   Collection Time: 08/16/18  8:16 AM  Result Value Ref Range   Ferritin 47 11 - 307 ng/mL    Comment: Performed at [facility], [address] [place], [place] [address]  Iron and TIBC     Status: Abnormal   Collection Time: 08/16/18  8:16 AM  Result Value Ref Range   Iron 117 28 - 170 ug/dL   TIBC 308 250 - 450 ug/dL   Saturation Ratios 38 (H) 10.4 - 31.8 %   UIBC 191 ug/dL    Comment: Performed at [facility], [address] [place], [place] [address]  Vitamin D (25 hydroxy)     Status: None   Collection Time: 08/16/18  8:16 AM  Result Value Ref Range   Vit D, 25-Hydroxy 59.5 30.0 - 100.0 ng/mL    Comment: (NOTE) Vitamin D deficiency has been defined by the [place] practice guideline as a level of serum 25-OH vitamin D less than 20 ng/mL (1,2). The Endocrine Society went on to further define vitamin D insufficiency as a level between 21 and 29 ng/mL (2). 1. IOM (Institute of Medicine). 2010. Dietary reference   intakes for calcium and D. [place]: The   [employer]. 2. Holick MF, Binkley [place], Bischoff-Ferrari HA, et al.   Evaluation, treatment, and prevention of vitamin D   deficiency: an  Endocrine Society clinical practice   guideline. JCEM. 2011 Jul; 96(7):1911-30. Performed At: [facility] [address]  [place], [place] [id number] Rush Farmer MD [id number]   B12     Status: None   Collection Time: 08/16/18  8:16 AM  Result Value Ref Range   Vitamin B-12 500 180 - 914 pg/mL    Comment: (NOTE) This assay is not validated for testing neonatal or myeloproliferative syndrome specimens for Vitamin B12 levels. Performed at [facility], [place] [address], [place], [place] [address]   Hemoglobin A1c     Status: None   Collection Time: 08/16/18  8:16 AM  Result Value Ref Range   Hgb A1c MFr Bld 5.6 4.8 - 5.6 %    Comment: (NOTE) Pre diabetes:          5.7%-6.4% Diabetes:              >6.4% Glycemic control for   <7.0% adults with diabetes    Mean Plasma Glucose 114.02 mg/dL    Comment: Performed at [facility] [address], [place], [place] [address]  T3, free     Status: None   Collection Time: 08/16/18  8:16 AM  Result Value Ref Range   T3, Free 3.2 2.0 - 4.4 pg/mL    Comment: (NOTE) Performed At: [facility] [place], [place] [id number] Rush Farmer MD [id number]   T4, free     Status: Abnormal   Collection Time: 08/16/18  8:16 AM  Result Value Ref Range   Free T4 0.81 (L) 0.82 - 1.77 ng/dL    Comment: (NOTE) Biotin ingestion may interfere with free T4 tests. If the results are inconsistent with the TSH level, previous test results, or the clinical presentation, then consider biotin interference. If needed, order repeat testing after stopping biotin. Performed at [facility], [address] [place], [place] [address]   TSH     Status: None   Collection Time: 08/16/18  8:16 AM  Result Value Ref Range   TSH 0.662 0.350 - 4.500 uIU/mL    Comment: Performed by a 3rd Generation assay with a functional sensitivity of <=0.01 uIU/mL. Performed at [facility], [address] [place], [place] [address]   Lipid panel     Status: Abnormal   Collection Time: 08/16/18  8:16 AM  Result Value Ref Range   Cholesterol 185 0 - 200 mg/dL   Triglycerides 102 <150 mg/dL   HDL 43 >40 mg/dL   Total CHOL/HDL Ratio 4.3 RATIO   VLDL 20 0 - 40 mg/dL   LDL Cholesterol 122 (H) 0 - 99 mg/dL    Comment:        Total Cholesterol/HDL:CHD Risk Coronary Heart Disease Risk Table                     Men   Women  1/2 Average Risk   3.4   3.3  Average Risk       5.0   4.4  2 X Average Risk   9.6   7.1  3 X Average Risk  23.4   11.0        Use the calculated Patient Ratio above and the CHD Risk Table to determine the patient's CHD Risk.        ATP III CLASSIFICATION (LDL):  <100     mg/dL   Optimal  100-129  mg/dL   Near or Above                    Optimal  130-159  mg/dL   Borderline  160-189  mg/dL   High  >190     mg/dL   Very High Performed at [facility], [address] [place], [place] [address]   Magnesium     Status: None   Collection Time: 08/16/18  8:16 AM  Result Value Ref Range   Magnesium 2.0 1.7 - 2.4 mg/dL    Comment: Performed at [facility], [address] [place], [place] [address]  CBC with Differential/Platelet     Status: None   Collection Time: 08/16/18  8:16 AM  Result Value Ref Range   WBC 5.8 3.6 - 11.0 K/uL   RBC 4.00 3.80 - 5.20 MIL/uL   Hemoglobin 12.6 12.0 - 16.0 g/dL   HCT 36.4 35.0 - 47.0 %   MCV 91.0 80.0 - 100.0 fL   MCH 31.4 26.0 - 34.0 pg   MCHC 34.5 32.0 - 36.0 g/dL   RDW 13.3 11.5 - 14.5 %   Platelets 303 150 - 440 K/uL   Neutrophils Relative % 41 %   Neutro Abs 2.4 1.4 - 6.5 K/uL   Lymphocytes Relative 50 %   Lymphs Abs 2.9 1.0 - 3.6 K/uL   Monocytes Relative 5 %   Monocytes Absolute 0.3 0.2 - 0.9 K/uL   Eosinophils Relative 3 %   Eosinophils Absolute 0.2 0 - 0.7 K/uL   Basophils Relative 1 %   Basophils Absolute 0.1 0 - 0.1 K/uL    Comment: Performed at [facility], [address]  [place], [place] [address]  Comprehensive metabolic panel     Status: Abnormal   Collection Time: 08/16/18  8:16 AM  Result Value Ref Range   Sodium 142 135 - 145 mmol/L   Potassium 2.9 (L) 3.5 - 5.1 mmol/L   Chloride 102 98 - 111 mmol/L   CO2 31 22 - 32 mmol/L   Glucose, Bld 83 70 - 99 mg/dL   BUN 11 6 - 20 mg/dL   Creatinine, Ser 0.78 0.44 - 1.00 mg/dL   Calcium 9.3 8.9 - 10.3 mg/dL   Total Protein 7.2 6.5 - 8.1 g/dL   Albumin 3.9 3.5 - 5.0 g/dL   AST 19 15 - 41 U/L   ALT 14 0 - 44 U/L   Alkaline Phosphatase 94 38 - 126 U/L   Total Bilirubin 1.0 0.3 - 1.2 mg/dL   GFR calc non Af Amer >60 >60 mL/min   GFR calc Af Amer >60 >60 mL/min    Comment: (NOTE) The eGFR has been calculated using the CKD EPI equation. This calculation has not been validated in all clinical situations. eGFR's persistently <60 mL/min signify possible Chronic Kidney Disease.    Anion gap 9 5 - 15    Comment: Performed at [facility], [address] [place], [place] [address]   Objective  Body mass index is 35.81 kg/m. Wt Readings from Last 3 Encounters:  08/21/18 208 lb 9.6 oz (94.6 kg)  07/22/18 209 lb 6.4 oz (95 kg)  04/09/18 207 lb 1.6 oz (93.9 kg)   Temp Readings from Last 3 Encounters:  08/21/18 98.4 F (36.9 C) (Oral)  07/22/18 98.7 F (37.1 C) (Oral)  10/18/16 98.3 F (36.8 C)   BP Readings from Last 3 Encounters:  08/21/18 98/60  07/22/18 126/76  04/09/18 90/60   Pulse Readings from Last 3 Encounters:  08/21/18 62  07/22/18 60  04/09/18 65    Physical Exam  Constitutional: She is oriented to  person, place, and time. Vital signs are normal. She appears well-developed and well-nourished. She is cooperative.  HENT:  Head: Normocephalic and atraumatic.  Mouth/Throat: Oropharynx is clear and moist and mucous membranes are normal.  Eyes: Pupils are equal, round, and reactive to light. Conjunctivae are normal.  Cardiovascular: Normal rate, regular rhythm and normal heart  sounds.  Pulmonary/Chest: Effort normal and breath sounds normal.  Neurological: She is alert and oriented to person, place, and time. Gait normal.  Skin: Skin is warm, dry and intact.  Psychiatric: She has a normal mood and affect. Her speech is normal and behavior is normal. Judgment and thought content normal. Cognition and memory are normal.  Nursing note and vitals reviewed.   Assessment   1. Annual exam 2. HLD/HTN  3. Arthralgia history r/o OA vs RA 4. Daytime fatigue with snoring  Plan   1.  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 Marcelline Mates 04/09/18 per pt had pap per but Dr. 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 Cont meds  hctz 12.5 mg qd and micardis 20 mg qd, benicar 20-12.5 is on back order with mail order pharmacy  Check BMET today low K given high K list  F/u in 6 months Given cholesterol info  3. Check RA labs prev checked 08/2016 and negative no anti CCP done  4.  Consider sleep study in future pt to let me know   Provider: Dr. Olivia Mackie McLean-Scocuzza-Internal Medicine

## 2018-08-21 NOTE — Patient Instructions (Addendum)
F/u in 3-6 months sooner if needed   Recombinant Zoster (Shingles) Vaccine, RZV: What You Need to Know 1. Why get vaccinated? Shingles (also called herpes zoster, or just zoster) is a painful skin rash, often with blisters. Shingles is caused by the varicella zoster virus, the same virus that causes chickenpox. After you have chickenpox, the virus stays in your body and can cause shingles later in life. You can't catch shingles from another person. However, a person who has never had chickenpox (or chickenpox vaccine) could get chickenpox from someone with shingles. A shingles rash usually appears on one side of the face or body and heals within 2 to 4 weeks. Its main symptom is pain, which can be severe. Other symptoms can include fever, headache, chills and upset stomach. Very rarely, a shingles infection can lead to pneumonia, hearing problems, blindness, brain inflammation (encephalitis), or death. For about 1 person in 5, severe pain can continue even long after the rash has cleared up. This long-lasting pain is called post-herpetic neuralgia (PHN). Shingles is far more common in people 52 years of age and older than in younger people, and the risk increases with age. It is also more common in people whose immune system is weakened because of a disease such as cancer, or by drugs such as steroids or chemotherapy. At least 1 million people a year in the [place] get shingles. 2. Shingles vaccine (recombinant) Recombinant shingles vaccine was approved by FDA in 2017 for the prevention of shingles. In clinical trials, it was more than 90% effective in preventing shingles. It can also reduce the likelihood of PHN. Two doses, 2 to 6 months apart, are recommended for adults 52 and older. This vaccine is also recommended for people who have already gotten the live shingles vaccine (Zostavax). There is no live virus in this vaccine. 3. Some people should not get this vaccine Tell your vaccine  provider if you:  Have any severe, life-threatening allergies. A person who has ever had a life-threatening allergic reaction after a dose of recombinant shingles vaccine, or has a severe allergy to any component of this vaccine, may be advised not to be vaccinated. Ask your health care provider if you want information about vaccine components.  Are pregnant or breastfeeding. There is not much information about use of recombinant shingles vaccine in pregnant or nursing women. Your healthcare provider might recommend delaying vaccination.  Are not feeling well. If you have a mild illness, such as a cold, you can probably get the vaccine today. If you are moderately or severely ill, you should probably wait until you recover. Your doctor can advise you.  4. Risks of a vaccine reaction With any medicine, including vaccines, there is a chance of reactions. After recombinant shingles vaccination, a person might experience:  Pain, redness, soreness, or swelling at the site of the injection  Headache, muscle aches, fever, shivering, fatigue  In clinical trials, most people got a sore arm with mild or moderate pain after vaccination, and some also had redness and swelling where they got the shot. Some people felt tired, had muscle pain, a headache, shivering, fever, stomach pain, or nausea. About 1 out of 6 people who got recombinant zoster vaccine experienced side effects that prevented them from doing regular activities. Symptoms went away on their own in about 2 to 3 days. Side effects were more common in younger people. You should still get the second dose of recombinant zoster vaccine even if you had one  of these reactions after the first dose. Other things that could happen after this vaccine:  People sometimes faint after medical procedures, including vaccination. Sitting or lying down for about 15 minutes can help prevent fainting and injuries caused by a fall. Tell your provider if you feel  dizzy or have vision changes or ringing in the ears.  Some people get shoulder pain that can be more severe and longer-lasting than routine soreness that can follow injections. This happens very rarely.  Any medication can cause a severe allergic reaction. Such reactions to a vaccine are estimated at about 1 in a million doses, and would happen within a few minutes to a few hours after the vaccination. As with any medicine, there is a very remote chance of a vaccine causing a serious injury or death. The safety of vaccines is always being monitored. For more information, visit: http://www.aguilar.org/ 5. What if there is a serious problem? What should I look for?  Look for anything that concerns you, such as signs of a severe allergic reaction, very high fever, or unusual behavior. Signs of a severe allergic reaction can include hives, swelling of the face and throat, difficulty breathing, a fast heartbeat, dizziness, and weakness. These would usually start a few minutes to a few hours after the vaccination. What should I do?  If you think it is a severe allergic reaction or other emergency that can't wait, call 9-1-1 and get to the nearest hospital. Otherwise, call your health care provider. Afterward, the reaction should be reported to the Vaccine Adverse Event Reporting System (VAERS). Your doctor should file this report, or you can do it yourself through the VAERS web site atwww.vaers.https://www.bray.com/ by calling 503-621-5867. VAERS does not give medical advice. 6. How can I learn more?  Ask your healthcare provider. He or she can give you the vaccine package insert or suggest other sources of information.  Call your local or state health department.  Contact the Centers for Disease Control and Prevention (CDC): ? Call (606) 754-0721 (1-800-CDC-INFO) or ? Visit the CDC's website at http://hunter.com/ CDC Vaccine Information Statement (VIS) Recombinant Zoster Vaccine (12/25/2016) This  information is not intended to replace advice given to you by your health care provider. Make sure you discuss any questions you have with your health care provider. Document Released: 01/09/2017 Document Revised: 01/09/2017 Document Reviewed: 01/09/2017 Elsevier Interactive Patient Education  2018 [place].  Indication: Thyroid nodule 10/14/15 thyroid US  Comparison: Neck ultrasounds on 12/11/14 and 05/28/14  Technique: Gray-scale and color Doppler images of the neck were obtained.  Findings: THYROID: The right lobe of the thyroid measures 6.06 x 2.03 x 1.96 cm. The left lobe of the thyroid measures 5.37 x 2.35 x 1.61 cm. The isthmus measures 0.45 cm in AP depth.  Echotexture of the thyroid is homogeneous.  Within the right lobe, there are no nodules.  Within the left lobe, there is a mid pole hypoechoic 0.89 x 0.56 x 0.96 cm  nodule.   There are no significantly enlarged lymph nodes in the neck.  Impression: Homogeneous thyroid gland which is modestly enlarged.  There is a left-sided mid pole 0.96 cm nodule which is stable in size in  comparison to prior ultrasounds in 11/2014 and 05/2014.

## 2018-08-22 ENCOUNTER — Telehealth: Payer: Self-pay | Admitting: Internal Medicine

## 2018-08-22 LAB — URINALYSIS, ROUTINE W REFLEX MICROSCOPIC
Bilirubin, UA: NEGATIVE
Glucose, UA: NEGATIVE
Ketones, UA: NEGATIVE
Leukocytes, UA: NEGATIVE
NITRITE UA: NEGATIVE
Protein, UA: NEGATIVE
RBC, UA: NEGATIVE
SPEC GRAV UA: 1.02 (ref 1.005–1.030)
UUROB: 1 mg/dL (ref 0.2–1.0)
pH, UA: 5.5 (ref 5.0–7.5)

## 2018-08-22 LAB — BASIC METABOLIC PANEL
BUN: 11 mg/dL (ref 6–23)
CO2: 29 mEq/L (ref 19–32)
Calcium: 9.2 mg/dL (ref 8.4–10.5)
Chloride: 102 mEq/L (ref 96–112)
Creatinine, Ser: 0.79 mg/dL (ref 0.40–1.20)
GFR: 98.05 mL/min (ref >60.00)
Glucose, Bld: 102 mg/dL — ABNORMAL HIGH (ref 70–99)
Potassium: 3.4 mEq/L — ABNORMAL LOW (ref 3.5–5.1)
Sodium: 139 mEq/L (ref 135–145)

## 2018-08-22 LAB — SEDIMENTATION RATE: Sed Rate: 34 mm/hr — ABNORMAL HIGH (ref 0–30)

## 2018-08-22 LAB — C-REACTIVE PROTEIN: CRP: 0.9 mg/dL (ref 0.5–20.0)

## 2018-08-22 NOTE — Telephone Encounter (Signed)
Copied from [place] [phone]. Topic: Quick Communication - Lab Results >> Aug 22, 2018  8:47 AM Kelly Pittman R wrote: Patient is calling in requesting her lab results to be faxed to her  Fax# [phone]

## 2018-08-22 NOTE — Telephone Encounter (Signed)
Can you fax results

## 2018-08-23 ENCOUNTER — Telehealth: Payer: Self-pay | Admitting: Internal Medicine

## 2018-08-23 LAB — RHEUMATOID FACTOR: Rhuematoid fact SerPl-aCnc: <14 IU/mL (ref <14)

## 2018-08-23 LAB — CYCLIC CITRUL PEPTIDE ANTIBODY, IGG: Cyclic Citrullin Peptide Ab: <16 UNITS

## 2018-08-23 LAB — ANTI-NUCLEAR AB-TITER (ANA TITER): ANA Titer 1: 1:80 {titer} — ABNORMAL HIGH

## 2018-08-23 LAB — ANA: Anti Nuclear Antibody(ANA): POSITIVE — AB

## 2018-08-23 NOTE — Telephone Encounter (Signed)
Copied from [place] [id number]. Topic: Quick Communication - See Telephone Encounter >> Aug 23, 2018  2:14 PM Blase Mess A wrote: CRM for notification. See Telephone encounter for: 08/23/18.  Patient is calling to have results Lab result from 08/21/18 via phone please advise.

## 2018-08-26 ENCOUNTER — Telehealth: Payer: Self-pay | Admitting: Internal Medicine

## 2018-08-26 NOTE — Telephone Encounter (Signed)
Patient called and she says she received her lab results from a nurse already today, but had more questions. She says she wanted more of an explanation of the ANA and if there are numbers associated with it. I advised what the ANA results indicated per labs and the recommendation per Dr. Terese Door to see a Rheumatologist to better clarify what the positive result means in terms of autoimmune disease workup. She verbalized understanding and says she would rather see a Merchant navy officer at [facility] in [place], Dr. Cristi Loron. I advised I will send this request to Dr. Terese Door and someone from the office will call with the appointment. She asks for her labs to be faxed to her and says she will call back tomorrow with a fax number.

## 2018-08-26 NOTE — Telephone Encounter (Signed)
Patient returning call for lab results. Nurse triage unavailable. Patient hung up.   Copied from [place] [phone]. Topic: Quick Communication - Lab Results (Clinic Use ONLY) >> Aug 26, 2018  4:23 PM Babs Bertin, CMA wrote: Called patient to inform them of 14Oct2019 lab results. When patient returns call, triage nurse may disclose results.

## 2018-08-26 NOTE — Telephone Encounter (Signed)
Please see result note 

## 2018-08-27 ENCOUNTER — Telehealth: Payer: Self-pay

## 2018-08-27 NOTE — Telephone Encounter (Addendum)
Error

## 2018-08-27 NOTE — Telephone Encounter (Signed)
See below message.Patient calls back wanting to speak with Dr Aundra Dubin in regards to lab results regarding positive ANA . I advised her that she would seeing patients and would be this pm before she could return call. Triage RN has advised her of results but she still has further questions.  Will fax lab results to her at [phone], She would like return call at [phone]  She would like to be referred Rheumatologist at [facility] in [place] Dr Cristi Loron.

## 2018-08-27 NOTE — Telephone Encounter (Signed)
My suggestions  Dr. Juleen China is not taking new patients otherwise I rec Dr. Tomasa Blase in [place] further work up to see if or if she does not have rheumatologic disorder   [place]

## 2018-08-28 ENCOUNTER — Telehealth: Payer: Self-pay | Admitting: Internal Medicine

## 2018-08-28 NOTE — Telephone Encounter (Signed)
Change rheumatology referral to Dr. Meda Coffee please   Thanks tMS

## 2018-08-28 NOTE — Telephone Encounter (Signed)
Patient would like the referral placed DR. Cassell [facility] clinic.

## 2018-08-28 NOTE — Telephone Encounter (Signed)
Pt notified of Dr. Amil Amen; pt wanted a physician closer she is going to call Dr. Uvaldo Bristle as well; pt will contact back for a referral on her choice

## 2018-08-28 NOTE — Telephone Encounter (Signed)
Left message for patient to return call to office P[facility] may advise.

## 2018-08-28 NOTE — Telephone Encounter (Signed)
Copied from [place] [phone]. Topic: Referral - Status >> Aug 28, 2018  9:17 AM Cecelia Byars, NT wrote: Reason for CRM: Patient called  Dr Meda Coffee  at [facility] their # is  [phone]

## 2018-08-30 ENCOUNTER — Other Ambulatory Visit: Payer: Self-pay | Admitting: Internal Medicine

## 2018-08-30 DIAGNOSIS — R768 Other specified abnormal immunological findings in serum: Secondary | ICD-10-CM

## 2018-08-30 NOTE — Telephone Encounter (Signed)
Referral to Dr. Meda Coffee in

## 2018-08-30 NOTE — Telephone Encounter (Signed)
Can you please enter the referral for me? The only one I see is for an ultrasound.

## 2018-09-02 ENCOUNTER — Telehealth: Payer: Self-pay

## 2018-09-02 NOTE — Telephone Encounter (Signed)
Copied from [place] [id number]. Topic: General - Other >> Sep 02, 2018 10:08 AM Carolyn Stare wrote:  Pt call to say Dr Meda Coffee office is waiting on the referral and a copy of her labs

## 2018-09-10 DIAGNOSIS — I7300 Raynaud's syndrome without gangrene: Secondary | ICD-10-CM | POA: Diagnosis not present

## 2018-09-10 DIAGNOSIS — R768 Other specified abnormal immunological findings in serum: Secondary | ICD-10-CM | POA: Insufficient documentation

## 2018-09-10 HISTORY — DX: Other specified abnormal immunological findings in serum: R76.8

## 2018-10-08 ENCOUNTER — Encounter: Payer: Self-pay | Admitting: Family Medicine

## 2018-10-08 ENCOUNTER — Ambulatory Visit (INDEPENDENT_AMBULATORY_CARE_PROVIDER_SITE_OTHER): Payer: BLUE CROSS/BLUE SHIELD | Admitting: Family Medicine

## 2018-10-08 VITALS — BP 120/78 | HR 62 | Temp 98.4°F | Ht 64.0 in | Wt 210.0 lb

## 2018-10-08 DIAGNOSIS — M545 Low back pain, unspecified: Secondary | ICD-10-CM

## 2018-10-08 DIAGNOSIS — M62830 Muscle spasm of back: Secondary | ICD-10-CM | POA: Diagnosis not present

## 2018-10-08 MED ORDER — NAPROXEN 500 MG PO TABS: 500.0000 mg | ORAL_TABLET | Freq: Two times a day (BID) | ORAL | 0 refills | Status: DC

## 2018-10-08 MED ORDER — METHYLPREDNISOLONE ACETATE 40 MG/ML IJ SUSP
40.0000 mg | Freq: Once | INTRAMUSCULAR | Status: AC
  Administered 2018-10-08: 40 mg via INTRAMUSCULAR

## 2018-10-08 MED ORDER — KETOROLAC TROMETHAMINE 60 MG/2ML IM SOLN
60.0000 mg | Freq: Once | INTRAMUSCULAR | Status: AC
  Administered 2018-10-08: 60 mg via INTRAMUSCULAR

## 2018-10-08 MED ORDER — CYCLOBENZAPRINE HCL 5 MG PO TABS: 5.0000 mg | ORAL_TABLET | Freq: Three times a day (TID) | ORAL | 1 refills | Status: DC | PRN

## 2018-10-08 MED ORDER — ACETAMINOPHEN-CODEINE #3 300-30 MG PO TABS: 1.0000 | ORAL_TABLET | ORAL | 0 refills | Status: DC | PRN

## 2018-10-08 NOTE — Progress Notes (Signed)
Subjective:    Patient ID: Kelly Pittman, female    DOB: [birth year], 52 y.o.   MRN: [id number]  HPI  Presents to clinic c/o low back pain for 1 day.  Patient states she woke up this morning and had a hard time getting up out of bed due to grabbing pain in left-sided low back.  Patient denies any fall or known injury.  Patient does report she was doing a lot of things at her house yesterday that required lifting boxes, stacking things and carrying items from room to room.  Patient speculates she could have hurt her back while doing these things.  Patient denies any fever or chills.  Denies any numbness of lower extremities.  Denies any saddle anesthesia or loss of bowel/bladder control  Most recent BMP reviewed, kidney functions very good; BMP Latest Ref Rng & Units 08/21/2018 08/16/2018 12/17/2014  Glucose 70 - 99 mg/dL 102(H) 83 73  BUN 6 - 23 mg/dL 11 11 13   Creatinine 0.40 - 1.20 mg/dL 0.79 0.78 0.84  Sodium 135 - 145 mEq/L 139 142 141  Potassium 3.5 - 5.1 mEq/L 3.4(L) 2.9(L) 3.2(L)  Chloride 96 - 112 mEq/L 102 102 102  CO2 19 - 32 mEq/L 29 31 34(H)  Calcium 8.4 - 10.5 mg/dL 9.2 9.3 9.3   Recent CBC reviewed: CBC Latest Ref Rng & Units 08/16/2018 12/17/2014 08/27/2014  WBC 3.6 - 11.0 K/uL 5.8 7.0 8.0  Hemoglobin 12.0 - 16.0 g/dL 12.6 13.8 13.3  Hematocrit 35.0 - 47.0 % 36.4 41.1 41.5  Platelets 150 - 440 K/uL 303 285 263    Patient Active Problem List   Diagnosis Date Noted  . Annual physical exam 08/21/2018  . HLD (hyperlipidemia) 08/21/2018  . Thyroid nodule 07/22/2018  . Goiter 07/22/2018  . Essential hypertension 02/18/2015  . Generalized anxiety disorder 02/18/2015  . Post-nasal drip 02/18/2015  . Seasonal allergies 02/18/2015  . Spastic pelvic floor syndrome 06/05/2014  . Abdominal wall pain 05/01/2014  . Obesity 03/27/2014   Social History   Tobacco Use  . Smoking status: Former Smoker    Packs/day: 0.25  . Smokeless tobacco: Never Used  Substance Use  Topics  . Alcohol use: Yes    Comment: socially   Review of Systems  Constitutional: Negative for chills, fatigue and fever.  HENT: Negative for congestion, ear pain, sinus pain and sore throat.   Eyes: Negative.   Respiratory: Negative for cough, shortness of breath and wheezing.   Cardiovascular: Negative for chest pain, palpitations and leg swelling.  Gastrointestinal: Negative for abdominal pain, diarrhea, nausea and vomiting.  Genitourinary: Negative for dysuria, frequency and urgency.  Musculoskeletal: +low back pain, L  Skin: Negative for color change, pallor and rash.  Neurological: Negative for syncope, light-headedness and headaches.  Psychiatric/Behavioral: The patient is not nervous/anxious.       Objective:   Physical Exam  Constitutional: She is oriented to person, place, and time.  Appears uncomfortable  HENT:  Head: Normocephalic and atraumatic.  Eyes: Pupils are equal, round, and reactive to light. Conjunctivae and EOM are normal. No scleral icterus.  Neck: Neck supple. No tracheal deviation present.  Cardiovascular: Normal rate and regular rhythm.  Pulmonary/Chest: Effort normal and breath sounds normal.  Abdominal: Soft. Bowel sounds are normal. She exhibits no distension. There is no tenderness.  Musculoskeletal:       Back:  Area of tenderness represent a migraine circle on diagram.  Left straight leg raise does cause pulling pain on left-sided  low back.  Patient has pain with leaning and twisting towards left side.  Patient has 2 change positions slowly from going from sit to stand due to catching pain in left side low back.  Quadricep strength equal and strong.  Dorsi plantar flexion equal and strong.  Patient gait is normal.  Neurological: She is alert and oriented to person, place, and time.  Skin: Skin is warm and dry. No rash noted. No erythema. No pallor.  Psychiatric: She has a normal mood and affect.      Vitals:   10/08/18 1553  BP: 120/78    Pulse: 62  Temp: 98.4 F (36.9 C)  SpO2: 99%   Assessment & Plan:   Acute left-sided low back pain without sciatica/muscle spasms of back-patient will get Toradol 60 mg IM and methylprednisolone 40 mg IM x1 in clinic.  Patient will use naproxen twice daily taken with food for the next 1 to 2 weeks.  Also given prescription for Flexeril for muscle spasms and a small supply of Tylenol #3 to use if needed in episodes of more moderate to severe pain, patient advised that both Flexeril and Tylenol #3 can cause drowsiness to to not use together when first taking and also to not drive her car after taking either of these medications.  Patient advised to do some gentle stretching and range of motion exercises with low back, muscles and back and pain should slowly begin to resolve.  Patient advised that if her pain could persist longer than 2 weeks, we will have to consider imaging and physical therapy at that time.  [facility] PMP registry checked and is appropriate for prescription of Tylenol #3  Administrations This Visit    ketorolac (TORADOL) injection 60 mg    Admin Date 10/08/2018 Action Given Dose 60 mg Route Intramuscular Administered By Neta Ehlers, RMA       methylPREDNISolone acetate (DEPO-MEDROL) injection 40 mg    Admin Date 10/08/2018 Action Given Dose 40 mg Route Intramuscular Administered By Neta Ehlers, RMA         Keep regularly scheduled follow-up with PCP as planned.  Return to clinic sooner if any issues arise or if current symptoms persist or worsen.

## 2018-10-17 ENCOUNTER — Other Ambulatory Visit: Payer: Self-pay | Admitting: Internal Medicine

## 2018-10-17 DIAGNOSIS — J309 Allergic rhinitis, unspecified: Secondary | ICD-10-CM

## 2018-10-17 DIAGNOSIS — J040 Acute laryngitis: Secondary | ICD-10-CM

## 2018-10-17 DIAGNOSIS — J329 Chronic sinusitis, unspecified: Secondary | ICD-10-CM

## 2018-10-17 MED ORDER — FLUTICASONE PROPIONATE 50 MCG/ACT NA SUSP: 2.0000 | Freq: Every day | NASAL | 11 refills | Status: DC

## 2018-12-26 ENCOUNTER — Encounter: Payer: Self-pay | Admitting: Family Medicine

## 2018-12-26 ENCOUNTER — Ambulatory Visit (INDEPENDENT_AMBULATORY_CARE_PROVIDER_SITE_OTHER): Payer: BLUE CROSS/BLUE SHIELD | Admitting: Family Medicine

## 2018-12-26 VITALS — BP 120/80 | HR 64 | Temp 98.8°F | Resp 16 | Ht 64.0 in | Wt 200.8 lb

## 2018-12-26 DIAGNOSIS — J029 Acute pharyngitis, unspecified: Secondary | ICD-10-CM

## 2018-12-26 DIAGNOSIS — R197 Diarrhea, unspecified: Secondary | ICD-10-CM

## 2018-12-26 DIAGNOSIS — R112 Nausea with vomiting, unspecified: Secondary | ICD-10-CM

## 2018-12-26 DIAGNOSIS — B349 Viral infection, unspecified: Secondary | ICD-10-CM

## 2018-12-26 DIAGNOSIS — R52 Pain, unspecified: Secondary | ICD-10-CM

## 2018-12-26 LAB — POCT RAPID STREP A (OFFICE): RAPID STREP A SCREEN: NEGATIVE

## 2018-12-26 LAB — POC INFLUENZA A&B (BINAX/QUICKVUE)
INFLUENZA B, POC: NEGATIVE
Influenza A, POC: NEGATIVE

## 2018-12-26 MED ORDER — ONDANSETRON 4 MG PO TBDP: 4.0000 mg | ORAL_TABLET | Freq: Three times a day (TID) | ORAL | 0 refills | Status: DC | PRN

## 2018-12-26 NOTE — Progress Notes (Signed)
Subjective:    Patient ID: Kelly Pittman, female    DOB: 1966-05-06, 53 y.o.   MRN: [id number]  HPI   Patient presents to clinic complaining of nausea, vomiting, diarrhea, headache, scratchy throat, fever and chills, achiness all over that began on Monday.  Nausea and vomiting were worse on Tuesday, but have begun to resolve slowly.  Has not had any more episodes of diarrhea in the past 2 days either.  Nausea does seem better today.  Has been able to eat crackers and ginger ale.  She has used some over-the-counter Tylenol to help with aches and headache with OK effect.  States today is the best she has felt all week.  Denies chest pain.  Denies cough, shortness of breath or wheezing.  Patient Active Problem List   Diagnosis Date Noted  . Annual physical exam 08/21/2018  . HLD (hyperlipidemia) 08/21/2018  . Thyroid nodule 07/22/2018  . Goiter 07/22/2018  . Essential hypertension 02/18/2015  . Generalized anxiety disorder 02/18/2015  . Post-nasal drip 02/18/2015  . Seasonal allergies 02/18/2015  . Spastic pelvic floor syndrome 06/05/2014  . Abdominal wall pain 05/01/2014  . Obesity 03/27/2014   Social History   Tobacco Use  . Smoking status: Former Smoker    Packs/day: 0.25  . Smokeless tobacco: Never Used  Substance Use Topics  . Alcohol use: Yes    Comment: socially   Review of Systems   Constitutional: +chills, fatigue and fever.  HENT: +congestion, ear pain, sinus pain and sore throat.   Eyes: Negative.   Respiratory: Negative for cough, shortness of breath and wheezing.   Cardiovascular: Negative for chest pain, palpitations and leg swelling.  Gastrointestinal: +abdominal pain, diarrhea, nausea and vomiting.  Genitourinary: Negative for dysuria, frequency and urgency.  Musculoskeletal: +body aches  Skin: Negative for color change, pallor and rash.  Neurological: Negative for syncope, light-headedness and headaches.  Psychiatric/Behavioral: The patient is not  nervous/anxious.       Objective:   Physical Exam Vitals signs and nursing note reviewed.  Constitutional:      General: She is not in acute distress.    Appearance: She is ill-appearing (appears tired). She is not toxic-appearing.  HENT:     Head: Normocephalic and atraumatic.     Nose: Congestion and rhinorrhea present.     Mouth/Throat:     Mouth: Mucous membranes are moist.     Pharynx: Posterior oropharyngeal erythema present. No oropharyngeal exudate.  Eyes:     General: No scleral icterus.       Right eye: No discharge.        Left eye: No discharge.     Extraocular Movements: Extraocular movements intact.     Conjunctiva/sclera: Conjunctivae normal.  Neck:     Musculoskeletal: Neck supple. No neck rigidity.  Cardiovascular:     Rate and Rhythm: Normal rate and regular rhythm.  Pulmonary:     Effort: Pulmonary effort is normal. No respiratory distress.     Breath sounds: Normal breath sounds. No wheezing, rhonchi or rales.  Abdominal:     General: Bowel sounds are normal.     Palpations: Abdomen is soft.     Tenderness: There is no abdominal tenderness.  Lymphadenopathy:     Cervical: No cervical adenopathy.  Skin:    General: Skin is warm and dry.  Neurological:     Mental Status: She is alert and oriented to person, place, and time.  Psychiatric:        Mood  and Affect: Mood normal.        Behavior: Behavior normal.    Today's Vitals   12/26/18 1333  BP: 120/80  Pulse: 64  Resp: 16  Temp: 98.8 F (37.1 C)  TempSrc: Oral  SpO2: 98%  Weight: 200 lb 12.8 oz (91.1 kg)  Height: 5\' 4"  (1.626 m)   Body mass index is 34.47 kg/m.     Assessment & Plan:   Viral illness, body aches, sore throat, nausea/vomiting diarrhea - patient's point-of-care flu and strep swabs are negative in clinic.  Patient's symptoms and physical exam do appear consistent with a systemic viral illness.  She will use Zofran as needed to help calm nausea and follow a bland food  clear/liquid diet for the next 24 to 48 hours and slowly advance diet as tolerated.  Advised she can alternate Tylenol and Advil as needed for any aches or fever.    Patient will keep regularly scheduled follow-up with PCP as planned.  She will return to clinic sooner if any issues arise.

## 2018-12-26 NOTE — Patient Instructions (Signed)
Your flu swab and strep swab are negative in clinic.  Keep up good fluid intake.  Get plenty of rest and do good handwashing.  Use Zofran as needed for any nausea.   Bland Diet A bland diet consists of foods that are often soft and do not have a lot of fat, fiber, or extra seasonings. Foods without fat, fiber, or seasoning are easier for the body to digest. They are also less likely to irritate your mouth, throat, stomach, and other parts of your digestive system. A bland diet is sometimes called a BRAT diet. What is my plan? Your health care provider or food and nutrition specialist (dietitian) may recommend specific changes to your diet to prevent symptoms or to treat your symptoms. These changes may include:  Eating small meals often.  Cooking food until it is soft enough to chew easily.  Chewing your food well.  Drinking fluids slowly.  Not eating foods that are very spicy, sour, or fatty.  Not eating citrus fruits, such as oranges and grapefruit. What do I need to know about this diet?  Eat a variety of foods from the bland diet food list.  Do not follow a bland diet longer than needed.  Ask your health care provider whether you should take vitamins or supplements. What foods can I eat? Grains  Hot cereals, such as cream of wheat. Rice. Bread, crackers, or tortillas made from refined white flour. Vegetables Canned or cooked vegetables. Mashed or boiled potatoes. Fruits  Bananas. Applesauce. Other types of cooked or canned fruit with the skin and seeds removed, such as canned peaches or pears. Meats and other proteins  Scrambled eggs. Creamy peanut butter or other nut butters. Lean, well-cooked meats, such as chicken or fish. Tofu. Soups or broths. Dairy Low-fat dairy products, such as milk, cottage cheese, or yogurt. Beverages  Water. Herbal tea. Apple juice. Fats and oils Mild salad dressings. Canola or olive oil. Sweets and desserts Pudding. Custard. Fruit  gelatin. Ice cream. The items listed above may not be a complete list of recommended foods and beverages. Contact a dietitian for more options. What foods are not recommended? Grains Whole grain breads and cereals. Vegetables Raw vegetables. Fruits Raw fruits, especially citrus, berries, or dried fruits. Dairy Whole fat dairy foods. Beverages Caffeinated drinks. Alcohol. Seasonings and condiments Strongly flavored seasonings or condiments. Hot sauce. Salsa. Other foods Spicy foods. Fried foods. Sour foods, such as pickled or fermented foods. Foods with high sugar content. Foods high in fiber. The items listed above may not be a complete list of foods and beverages to avoid. Contact a dietitian for more information. Summary  A bland diet consists of foods that are often soft and do not have a lot of fat, fiber, or extra seasonings.  Foods without fat, fiber, or seasoning are easier for the body to digest.  Check with your health care provider to see how long you should follow this diet plan. It is not meant to be followed for long periods. This information is not intended to replace advice given to you by your health care provider. Make sure you discuss any questions you have with your health care provider. Document Released: 02/21/2016 Document Revised: 11/28/2017 Document Reviewed: 11/28/2017 Elsevier Interactive Patient Education  2019 [employer].

## 2019-01-07 ENCOUNTER — Other Ambulatory Visit: Payer: Self-pay | Admitting: Internal Medicine

## 2019-01-07 DIAGNOSIS — K219 Gastro-esophageal reflux disease without esophagitis: Secondary | ICD-10-CM

## 2019-01-07 MED ORDER — PANTOPRAZOLE SODIUM 40 MG PO TBEC: 40.0000 mg | DELAYED_RELEASE_TABLET | Freq: Every day | ORAL | 3 refills | Status: DC

## 2019-01-23 ENCOUNTER — Encounter: Payer: Self-pay | Admitting: Family Medicine

## 2019-01-23 ENCOUNTER — Other Ambulatory Visit: Payer: Self-pay

## 2019-01-23 ENCOUNTER — Ambulatory Visit (INDEPENDENT_AMBULATORY_CARE_PROVIDER_SITE_OTHER): Payer: BLUE CROSS/BLUE SHIELD | Admitting: Family Medicine

## 2019-01-23 VITALS — BP 124/88 | HR 60 | Temp 98.2°F | Resp 18 | Ht 64.0 in | Wt 204.0 lb

## 2019-01-23 DIAGNOSIS — R102 Pelvic and perineal pain: Secondary | ICD-10-CM | POA: Diagnosis not present

## 2019-01-23 DIAGNOSIS — R350 Frequency of micturition: Secondary | ICD-10-CM | POA: Diagnosis not present

## 2019-01-23 LAB — POCT URINALYSIS DIPSTICK
BILIRUBIN UA: NEGATIVE
Blood, UA: NEGATIVE
GLUCOSE UA: NEGATIVE
Ketones, UA: NEGATIVE
Leukocytes, UA: NEGATIVE
Nitrite, UA: NEGATIVE
Protein, UA: NEGATIVE
Spec Grav, UA: 1.025 (ref 1.010–1.025)
Urobilinogen, UA: 0.2 E.U./dL
pH, UA: 6 (ref 5.0–8.0)

## 2019-01-23 NOTE — Progress Notes (Signed)
Subjective:    Patient ID: Kelly Pittman, female    DOB: 03-Feb-1966, 53 y.o.   MRN: [id number]  HPI   Patient presents to clinic due to pelvic pressure/pain, increased urinary frequency and urgency and some burning with urination.  Symptoms have been present for about a week.  Patient does report a history of pelvic floor dysfunction where she had to see urology and also did PT for pelvic pain.  Denies fever or chills.  Denies nausea, vomiting or diarrhea.   Patient Active Problem List   Diagnosis Date Noted  . Annual physical exam 08/21/2018  . HLD (hyperlipidemia) 08/21/2018  . Thyroid nodule 07/22/2018  . Goiter 07/22/2018  . Essential hypertension 02/18/2015  . Generalized anxiety disorder 02/18/2015  . Post-nasal drip 02/18/2015  . Seasonal allergies 02/18/2015  . Spastic pelvic floor syndrome 06/05/2014  . Abdominal wall pain 05/01/2014  . Obesity 03/27/2014   Social History   Tobacco Use  . Smoking status: Former Smoker    Packs/day: 0.25  . Smokeless tobacco: Never Used  Substance Use Topics  . Alcohol use: Yes    Comment: socially   Review of Systems   Constitutional: Negative for chills, fatigue and fever.  HENT: Negative for congestion, ear pain, sinus pain and sore throat.   Eyes: Negative.   Respiratory: Negative for cough, shortness of breath and wheezing.   Cardiovascular: Negative for chest pain, palpitations and leg swelling.  Gastrointestinal: Negative for abdominal pain, diarrhea, nausea and vomiting.  Genitourinary: +dysuria, frequency and urgency, pelvic floor pain Musculoskeletal: Negative for arthralgias and myalgias.  Skin: Negative for color change, pallor and rash.  Neurological: Negative for syncope, light-headedness and headaches.  Psychiatric/Behavioral: The patient is not nervous/anxious.       Objective:   Physical Exam Vitals signs and nursing note reviewed.  Constitutional:      General: She is not in acute distress.  Appearance: She is well-developed. She is not toxic-appearing.  HENT:     Head: Normocephalic and atraumatic.  Eyes:     General: No scleral icterus.    Extraocular Movements: Extraocular movements intact.     Conjunctiva/sclera: Conjunctivae normal.  Neck:     Musculoskeletal: Neck supple.     Trachea: No tracheal deviation.  Cardiovascular:     Rate and Rhythm: Normal rate and regular rhythm.     Heart sounds: Normal heart sounds.  Pulmonary:     Effort: Pulmonary effort is normal. No respiratory distress.     Breath sounds: Normal breath sounds.  Abdominal:     General: Bowel sounds are normal. There is no distension.     Palpations: Abdomen is soft. There is no mass.     Tenderness: There is abdominal tenderness. There is no right CVA tenderness, left CVA tenderness, guarding or rebound.  Genitourinary:    Comments: Declines pelvic exam in clinic Skin:    General: Skin is warm and dry.     Coloration: Skin is not jaundiced or pale.  Neurological:     Mental Status: She is alert and oriented to person, place, and time.     Gait: Gait normal.  Psychiatric:        Mood and Affect: Mood normal.        Behavior: Behavior normal.     Vitals:   01/23/19 1041  BP: 124/88  Pulse: 60  Resp: 18  Temp: 98.2 F (36.8 C)  SpO2: 98%      Assessment & Plan:  Urine frequency, pelvic pain in female - urinalysis appears unremarkable however patient's physical exam is somewhat consistent with a UTI.  We will treat with 3 days of Omnicef twice daily, and wait for urine culture to return.  I have also placed referral to physical therapy for reevaluation to see if patient would benefit from pelvic floor dysfunction PT.  Also discussed possibility of seeing a uro-gynecologist if pain in pelvic area continues/getting a pelvic exam.  Patient will be made aware of results when available.  Advised she can return to clinic sooner if any issues arise.

## 2019-01-24 LAB — URINE CULTURE
MICRO NUMBER:: 311486
RESULT: NO GROWTH
SPECIMEN QUALITY:: ADEQUATE

## 2019-02-20 ENCOUNTER — Ambulatory Visit (INDEPENDENT_AMBULATORY_CARE_PROVIDER_SITE_OTHER): Payer: BLUE CROSS/BLUE SHIELD | Admitting: Internal Medicine

## 2019-02-20 ENCOUNTER — Ambulatory Visit: Payer: BLUE CROSS/BLUE SHIELD | Admitting: Internal Medicine

## 2019-02-20 ENCOUNTER — Encounter: Payer: Self-pay | Admitting: Internal Medicine

## 2019-02-20 VITALS — BP 125/82

## 2019-02-20 DIAGNOSIS — I10 Essential (primary) hypertension: Secondary | ICD-10-CM | POA: Diagnosis not present

## 2019-02-20 DIAGNOSIS — Z1231 Encounter for screening mammogram for malignant neoplasm of breast: Secondary | ICD-10-CM | POA: Diagnosis not present

## 2019-02-20 DIAGNOSIS — E049 Nontoxic goiter, unspecified: Secondary | ICD-10-CM

## 2019-02-20 DIAGNOSIS — E876 Hypokalemia: Secondary | ICD-10-CM | POA: Diagnosis not present

## 2019-02-20 DIAGNOSIS — E559 Vitamin D deficiency, unspecified: Secondary | ICD-10-CM

## 2019-02-20 DIAGNOSIS — I7300 Raynaud's syndrome without gangrene: Secondary | ICD-10-CM | POA: Insufficient documentation

## 2019-02-20 DIAGNOSIS — J302 Other seasonal allergic rhinitis: Secondary | ICD-10-CM

## 2019-02-20 DIAGNOSIS — K219 Gastro-esophageal reflux disease without esophagitis: Secondary | ICD-10-CM | POA: Diagnosis not present

## 2019-02-20 DIAGNOSIS — F411 Generalized anxiety disorder: Secondary | ICD-10-CM

## 2019-02-20 DIAGNOSIS — E041 Nontoxic single thyroid nodule: Secondary | ICD-10-CM

## 2019-02-20 DIAGNOSIS — F419 Anxiety disorder, unspecified: Secondary | ICD-10-CM

## 2019-02-20 DIAGNOSIS — J309 Allergic rhinitis, unspecified: Secondary | ICD-10-CM

## 2019-02-20 DIAGNOSIS — R202 Paresthesia of skin: Secondary | ICD-10-CM | POA: Insufficient documentation

## 2019-02-20 HISTORY — DX: Hypokalemia: E87.6

## 2019-02-20 MED ORDER — MOMETASONE FUROATE 50 MCG/ACT NA SUSP: 2.0000 | Freq: Every day | NASAL | 12 refills | Status: DC

## 2019-02-20 MED ORDER — PANTOPRAZOLE SODIUM 40 MG PO TBEC: 40.0000 mg | DELAYED_RELEASE_TABLET | Freq: Every day | ORAL | 3 refills | Status: DC

## 2019-02-20 MED ORDER — ESCITALOPRAM OXALATE 10 MG PO TABS: 10.0000 mg | ORAL_TABLET | Freq: Every day | ORAL | 3 refills | Status: DC

## 2019-02-20 MED ORDER — LEVOCETIRIZINE DIHYDROCHLORIDE 5 MG PO TABS: 5.0000 mg | ORAL_TABLET | Freq: Every evening | ORAL | 3 refills | Status: DC

## 2019-02-20 MED ORDER — POTASSIUM CHLORIDE ER 20 MEQ PO TBCR: EXTENDED_RELEASE_TABLET | ORAL | 3 refills | Status: DC

## 2019-02-20 MED ORDER — HYDROCHLOROTHIAZIDE 12.5 MG PO TABS: 12.5000 mg | ORAL_TABLET | Freq: Every day | ORAL | 3 refills | Status: DC

## 2019-02-20 MED ORDER — CHOLECALCIFEROL 125 MCG (5000 UT) PO CAPS: 5000.0000 [IU] | ORAL_CAPSULE | Freq: Every day | ORAL | 3 refills | Status: DC

## 2019-02-20 MED ORDER — ALPRAZOLAM 0.25 MG PO TABS: 0.2500 mg | ORAL_TABLET | Freq: Every evening | ORAL | 2 refills | Status: DC | PRN

## 2019-02-20 MED ORDER — TELMISARTAN 20 MG PO TABS: 20.0000 mg | ORAL_TABLET | Freq: Every day | ORAL | 3 refills | Status: DC

## 2019-02-20 MED ORDER — MONTELUKAST SODIUM 10 MG PO TABS: 10.0000 mg | ORAL_TABLET | Freq: Every day | ORAL | 3 refills | Status: DC

## 2019-02-20 NOTE — Progress Notes (Signed)
Telephone Note  I connected with Kelly Pittman on 02/20/19 at  9:05 AM EDT by telephone and verified that I am speaking with the correct person using two identifiers.  Location patient: home Location provider:work  Persons participating in the virtual visit: patient, provider  I discussed the limitations of evaluation and management by telemedicine and the availability of in person appointments. The patient expressed understanding and agreed to proceed.   HPI: 1. HTN on telmisartan 20 and hctz 12.5 mg qd BP today 125/82 they did not have benicar 20-12.5 in stock previously so this was changed  2. Would like refill on all meds reviewed lasb 08/2018 +ANA 1:80 saw Dr. Meda Coffee 08/2018 and due to f/u in 6 months c/w Raynauds and rec physical modifications I.e wear gloves in cold weather  3. C/o tingling in feet with sitting on the toilet a long time no back pain CT 2018 ab/pelvis reviewed no arthritis noted in lower back    ROS: See pertinent positives and negatives per HPI.  Past Medical History:  Diagnosis Date  . Abnormal thyroid function test   . Anxiety   . 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 Colon polyp 2014  . Enlarged thyroid   . Hypertension   . UTI (urinary tract infection)     Past Surgical History:  Procedure Laterality Date  . ABDOMINAL HYSTERECTOMY     2009  . BREAST BIOPSY Right 2003  . CARPAL TUNNEL RELEASE    . FOOT SURGERY     right  . TONSILLECTOMY     1987  . UNILATERAL SALPINGECTOMY     left    Family History  Problem Relation Age of Onset  . Hypertension Mother   . Hyperlipidemia Mother   . Hypertension Father   . Breast cancer Neg Hx   . Ovarian cancer Neg Hx   . Stroke Neg Hx   . Thyroid disease Neg Hx     SOCIAL HX: in school medical billing/coding   Current Outpatient Medications:  .  acetaminophen-codeine (TYLENOL #3) 300-30 MG tablet, Take 1 tablet by mouth every 4 (four) hours as needed for moderate pain.,  Disp: 20 tablet, Rfl: 0 .  ALPRAZolam (XANAX) 0.25 MG tablet, Take 1 tablet (0.25 mg total) by mouth at bedtime as needed., Disp: 30 tablet, Rfl: 2 .  Cholecalciferol (CVS D3) 125 MCG (5000 UT) capsule, Take 1 capsule (5,000 Units total) by mouth daily., Disp: 90 capsule, Rfl: 3 .  escitalopram (LEXAPRO) 10 MG tablet, Take 1 tablet (10 mg total) by mouth daily., Disp: 90 tablet, Rfl: 3 .  hydrochlorothiazide (HYDRODIURIL) 12.5 MG tablet, Take 1 tablet (12.5 mg total) by mouth daily. In am, Disp: 90 tablet, Rfl: 3 .  levocetirizine (XYZAL) 5 MG tablet, Take 1 tablet (5 mg total) by mouth every evening., Disp: 90 tablet, Rfl: 3 .  mometasone (NASONEX) 50 MCG/ACT nasal spray, Place 2 sprays into the nose daily., Disp: 17 g, Rfl: 12 .  montelukast (SINGULAIR) 10 MG tablet, Take 1 tablet (10 mg total) by mouth at bedtime., Disp: 90 tablet, Rfl: 3 .  Multiple Vitamin (MULTIVITAMIN) capsule, Take by mouth daily. , Disp: , Rfl:  .  naproxen (NAPROSYN) 500 MG tablet, Take 1 tablet (500 mg total) by mouth 2 (two) times daily with a meal., Disp: 30 tablet, Rfl: 0 .  Nutritional Supplements (ESTROVEN PO), Take by mouth., Disp: , Rfl:  .  pantoprazole (PROTONIX) 40 MG tablet, Take 1  tablet (40 mg total) by mouth daily., Disp: 90 tablet, Rfl: 3 .  Potassium Chloride ER 20 MEQ TBCR, 20 meq daily, Disp: 90 tablet, Rfl: 3 .  telmisartan (MICARDIS) 20 MG tablet, Take 1 tablet (20 mg total) by mouth daily., Disp: 90 tablet, Rfl: 3 .  thiamine 250 MG tablet, Take 250 mg by mouth daily., Disp: , Rfl:   EXAM: telephone BP 125/82 today  VITALS per patient if applicable:  GENERAL: alert, oriented, appears well and in no acute distress  PSYCH/NEURO: pleasant and cooperative, no obvious depression or anxiety, speech and thought processing grossly intact  ASSESSMENT AND PLAN:  Discussed the following assessment and plan:  Essential hypertension - Plan: telmisartan (MICARDIS) 20 MG tablet, hydrochlorothiazide  (HYDRODIURIL) 12.5 MG tablet cont meds  -check labs in future at f/u   Hypokalemia - Plan: Potassium Chloride ER 20 MEQ TBCR  Gastroesophageal reflux disease, esophagitis presence not specified - Plan: pantoprazole (PROTONIX) 40 MG tablet  Allergic rhinitis, unspecified seasonality, unspecified trigger - Plan: montelukast (SINGULAIR) 10 MG tablet, levocetirizine (XYZAL) 5 MG tablet, mometasone (NASONEX) 50 MCG/ACT nasal spray  Anxiety - Plan: escitalopram (LEXAPRO) 10 MG tablet, ALPRAZolam (XANAX) 0.25 MG tablet  Vitamin D deficiency - Plan: Cholecalciferol (CVS D3) 125 MCG (5000 UT) capsule stop 50K IU weekly   Thyroid nodule/Goiter-thyroid US due 08/2019   Tingling in feet -if continues consider Xray low back  Raynauds-f/u Dr. 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 Marcelline Mates 04/09/18 per pt had pap per but Dr. Dessie Coma notes not pap s/p hysterectomy no h/o abnormal pap and no need further pap   Colonoscopy h/o polyps need to get record [facility] last colonoscopy in [place] signed release today echo and colonoscopy -of note also need echo report -2nd request  Smoker 2 cig/qd stopped 1 week ago max 2 cig/qd started in 20s   rec healthy diet and exercise   US thyroid 08/16/18 1.1 cm left thyroid nodule f/u in 1 year -consider order in future     I discussed the assessment and treatment plan with the patient. The patient was provided an opportunity to ask questions and all were answered. The patient agreed with the plan and demonstrated an understanding of the instructions.   The patient was advised to call back or seek an in-person evaluation if the symptoms worsen or if the condition fails to improve as anticipated.  I  provided 15 minutes of non-face-to-face time during this encounter.   Nino Glow McLean-Scocuzza, MD

## 2019-02-25 ENCOUNTER — Encounter: Payer: Self-pay | Admitting: Internal Medicine

## 2019-04-17 ENCOUNTER — Encounter: Payer: BLUE CROSS/BLUE SHIELD | Admitting: Obstetrics and Gynecology

## 2019-04-21 DIAGNOSIS — R768 Other specified abnormal immunological findings in serum: Secondary | ICD-10-CM | POA: Diagnosis not present

## 2019-04-21 DIAGNOSIS — I7300 Raynaud's syndrome without gangrene: Secondary | ICD-10-CM | POA: Diagnosis not present

## 2019-06-04 ENCOUNTER — Ambulatory Visit
Admission: RE | Admit: 2019-06-04 | Discharge: 2019-06-04 | Disposition: A | Discharge status: Home / Self Care | Payer: BC Managed Care – PPO | Source: Ambulatory Visit | Attending: Internal Medicine | Admitting: Internal Medicine

## 2019-06-04 DIAGNOSIS — Z1231 Encounter for screening mammogram for malignant neoplasm of breast: Secondary | ICD-10-CM | POA: Insufficient documentation

## 2019-06-24 ENCOUNTER — Telehealth: Payer: Self-pay

## 2019-06-24 ENCOUNTER — Encounter: Payer: Self-pay | Admitting: Obstetrics and Gynecology

## 2019-06-24 ENCOUNTER — Ambulatory Visit (INDEPENDENT_AMBULATORY_CARE_PROVIDER_SITE_OTHER): Payer: BC Managed Care – PPO | Admitting: Obstetrics and Gynecology

## 2019-06-24 ENCOUNTER — Other Ambulatory Visit: Payer: Self-pay

## 2019-06-24 VITALS — BP 122/82 | HR 60 | Ht 64.0 in | Wt 207.3 lb

## 2019-06-24 DIAGNOSIS — Z131 Encounter for screening for diabetes mellitus: Secondary | ICD-10-CM

## 2019-06-24 DIAGNOSIS — Z01419 Encounter for gynecological examination (general) (routine) without abnormal findings: Secondary | ICD-10-CM

## 2019-06-24 DIAGNOSIS — Z860100 Personal history of colon polyps, unspecified: Secondary | ICD-10-CM

## 2019-06-24 DIAGNOSIS — E668 Other obesity: Secondary | ICD-10-CM

## 2019-06-24 DIAGNOSIS — Z1231 Encounter for screening mammogram for malignant neoplasm of breast: Secondary | ICD-10-CM

## 2019-06-24 DIAGNOSIS — Z13220 Encounter for screening for lipoid disorders: Secondary | ICD-10-CM

## 2019-06-24 DIAGNOSIS — E669 Obesity, unspecified: Secondary | ICD-10-CM

## 2019-06-24 DIAGNOSIS — I10 Essential (primary) hypertension: Secondary | ICD-10-CM

## 2019-06-24 DIAGNOSIS — Z1211 Encounter for screening for malignant neoplasm of colon: Secondary | ICD-10-CM | POA: Diagnosis not present

## 2019-06-24 NOTE — Progress Notes (Signed)
ANNUAL PREVENTATIVE CARE GYNECOLOGY  ENCOUNTER NOTE  Subjective:       Kelly Pittman is a 53 y.o. [phone] (adopted daughter) female here for a routine annual gynecologic exam. The patient is sexually active, 1 partner. The patient is taking herbal remedies for hormone replacement therapy (OTC [place]roven). Patient denies post-menopausal vaginal bleeding. The patient wears seatbelts: yes. The patient participates in regular exercise: no. Has the patient ever been transfused or tattooed?: no. The patient reports that there is not domestic violence in her life.  Current complaints: 1.  None   Gynecologic History No LMP recorded. Patient has had a hysterectomy.  Menarche age: 53 Contraception: status post hysterectomy Last Pap: 12/2015. Results were: normal Last mammogram:06/04/2019. Results were: normal.  Patient with h/o abnormal mammogram 20 years ago, had right breast biopsy, benign findings.  Last Colonoscopy: 5.5 years ago, found polyps, need repeat after 5 years.    Obstetric History OB History  Gravida Para Term Preterm AB Living  3 0 0 0 3 1  SAB TAB Ectopic Multiple Live Births  3 0 0 0      # Outcome Date GA Lbr Len/2nd Weight Sex Delivery Anes PTL Lv  3 SAB           2 SAB           1 SAB             Obstetric Comments  Patient has 1 adopted daughter    Past Medical History:  Diagnosis Date  . Abnormal thyroid function test   . Anxiety   . 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 Colon polyp 2014  . Enlarged thyroid   . Hypertension   . UTI (urinary tract infection)     Family History  Problem Relation Age of Onset  . Hypertension Mother   . Hyperlipidemia Mother   . Hypertension Father   . Breast cancer Neg Hx   . Ovarian cancer Neg Hx   . Stroke Neg Hx   . Thyroid disease Neg Hx     Past Surgical History:  Procedure Laterality Date  . ABDOMINAL HYSTERECTOMY     2009  . BREAST BIOPSY Right 2003  . CARPAL TUNNEL RELEASE     . FOOT SURGERY     right  . TONSILLECTOMY     1987  . UNILATERAL SALPINGECTOMY     left    Social History   Socioeconomic History  . Marital status: Married    Spouse name: Not on file  . Number of children: Not on file  . Years of education: Not on file  . Highest education level: Not on file  Occupational History  . Not on file  Social Needs  . Financial resource strain: Not on file  . Food insecurity    Worry: Not on file    Inability: Not on file  . Transportation needs    Medical: Not on file    Non-medical: Not on file  Tobacco Use  . Smoking status: Former Smoker    Packs/day: 0.25  . Smokeless tobacco: Never Used  Substance and Sexual Activity  . Alcohol use: Yes    Comment: socially  . Drug use: No  . Sexual activity: Yes    Birth control/protection: Surgical  Lifestyle  . Physical activity    Days per week: Not on file    Minutes per session: Not on file  . Stress: Not on file  Relationships  . Social Herbalist on phone: Not on file    Gets together: Not on file    Attends religious service: Not on file    Active member of club or organization: Not on file    Attends meetings of clubs or organizations: Not on file    Relationship status: Not on file  . Intimate partner violence    Fear of current or ex partner: Not on file    Emotionally abused: Not on file    Physically abused: Not on file    Forced sexual activity: Not on file  Other Topics Concern  . 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 ALPRAZolam (XANAX) 0.25 MG tablet Take 1 tablet (0.25 mg total) by mouth at bedtime as needed. 30 tablet 2  .  CA-MG-VALERIAN-PASSIF-GINGKO PO Take by mouth.    . Cholecalciferol (CVS D3) 125 MCG (5000 UT) capsule Take 1 capsule (5,000 Units total) by mouth daily. 90 capsule 3  . cyanocobalamin 100 MCG tablet Take 100 mcg by mouth daily.    Marland Kitchen escitalopram (LEXAPRO) 10 MG tablet Take 1 tablet (10 mg total) by mouth daily. 90 tablet 3  . hydrochlorothiazide (HYDRODIURIL) 12.5 MG tablet Take 1 tablet (12.5 mg total) by mouth daily. In am 90 tablet 3  . levocetirizine (XYZAL) 5 MG tablet Take 1 tablet (5 mg total) by mouth every evening. 90 tablet 3  . montelukast (SINGULAIR) 10 MG tablet Take 1 tablet (10 mg total) by mouth at bedtime. 90 tablet 3  . Multiple Vitamin (MULTIVITAMIN) capsule Take by mouth daily.     . Nutritional Supplements (ESTROVEN PO) Take by mouth.    . pantoprazole (PROTONIX) 40 MG tablet Take 1 tablet (40 mg total) by mouth daily. 90 tablet 3  . Potassium Chloride ER 20 MEQ TBCR 20 meq daily 90 tablet 3  . telmisartan (MICARDIS) 20 MG tablet Take 1 tablet (20 mg total) by mouth daily. 90 tablet 3   No current facility-administered medications on file prior to visit.     Allergies  Allergen Reactions  . Losartan Potassium-Hctz     Other reaction(s): Other (See Comments) Weight gain, constipation  . Tape Rash     Review of Systems ROS Review of Systems - General ROS: negative for - chills, fatigue, fever, hot flashes, night sweats, weight gain or weight loss Psychological ROS: negative for - anxiety, decreased libido, depression, mood swings, physical abuse or sexual abuse Ophthalmic ROS: negative for - blurry vision, eye pain or loss of vision ENT ROS: negative for - headaches, hearing change, visual changes or vocal changes Allergy and Immunology ROS: negative for - hives, itchy/watery eyes or seasonal allergies Hematological and Lymphatic ROS: negative for - bleeding problems, bruising, swollen lymph nodes or weight loss Endocrine ROS: negative for - galactorrhea, hair  pattern changes, hot flashes, malaise/lethargy, mood swings, palpitations, polydipsia/polyuria, skin changes, temperature intolerance or unexpected weight changes Breast ROS: negative for - new or changing breast lumps or nipple discharge Respiratory ROS: negative  for - cough or shortness of breath Cardiovascular ROS: negative for - chest pain, irregular heartbeat, palpitations or shortness of breath Gastrointestinal ROS: no abdominal pain, change in bowel habits, or black or bloody stools Genito-Urinary ROS: no dysuria, trouble voiding, or hematuria Musculoskeletal ROS: negative for - joint pain or joint stiffness Neurological ROS: negative for - bowel and bladder control changes Dermatological ROS: negative for rash and skin lesion changes   Objective:   BP 122/82   Pulse 60   Ht 5\' 4"  (1.626 m)   Wt 207 lb 4.8 oz (94 kg)   BMI 35.58 kg/m  CONSTITUTIONAL: Well-developed, well-nourished female in no acute distress. Moderate obesity.  PSYCHIATRIC: Normal mood and affect. Normal behavior. Normal judgment and thought content. [place]: Alert and oriented to person, place, and time. Normal muscle tone coordination. No cranial nerve deficit noted. HENT:  Normocephalic, atraumatic, External right and left ear normal. Oropharynx is clear and moist EYES: Conjunctivae and EOM are normal. Pupils are equal, round, and reactive to light. No scleral icterus.  NECK: Normal range of motion, supple, no masses.  Normal thyroid.  SKIN: Skin is warm and dry. No rash noted. Not diaphoretic. No erythema. No pallor. CARDIOVASCULAR: Normal heart rate noted, regular rhythm, no murmur. RESPIRATORY: Clear to auscultation bilaterally. Effort and breath sounds normal, no problems with respiration noted. BREASTS: Symmetric in size. No masses, skin changes, nipple drainage, or lymphadenopathy.  Right breast with small faint scar from prior biopsy site.  ABDOMEN: Soft, normal bowel sounds, no distention noted.  No  tenderness, rebound or guarding. Well healed low transverse incision scar.  PELVIC:  Bladder no bladder distension noted  Urethra: normal appearing urethra with no masses, tenderness or lesions  Vulva: normal appearing vulva with no masses, tenderness or lesions  Vagina: normal appearing vagina with normal color and discharge, no lesions  Cervix: surgically absent  Uterus: surgically absent, vaginal cuff well healed  Adnexa: normal right adnexa in size, nontender and no masses. Left adnexa surgically absent.  RV: External Exam NormaI, No Rectal Masses and Normal Sphincter tone  MUSCULOSKELETAL: Normal range of motion. No tenderness.  No cyanosis, clubbing or edema.    2+ distal pulses. LYMPHATIC: No Axillary, Supraclavicular, or Inguinal Adenopathy.    Labs:  Lab Results  Component Value Date   WBC 5.8 08/16/2018   HGB 12.6 08/16/2018   HCT 36.4 08/16/2018   MCV 91.0 08/16/2018   PLT 303 08/16/2018   Lab Results  Component Value Date   CREATININE 0.79 08/21/2018   BUN 11 08/21/2018   NA 139 08/21/2018   K 3.4 (L) 08/21/2018   CL 102 08/21/2018   CO2 29 08/21/2018    Lab Results  Component Value Date   CHOL 185 08/16/2018   HDL 43 08/16/2018   LDLCALC 122 (H) 08/16/2018   TRIG 102 08/16/2018   CHOLHDL 4.3 08/16/2018    Lab Results  Component Value Date   HGBA1C 5.6 08/16/2018    Assessment:   Annual gynecologic examination 53 y.o. Menopausal syndrome on herbal remedy Moderate obesity Essential HTN History of colon polyps   Plan:  Pap: Not needed.  S/p hysterectomy. No prior history of severe dysplasia.  Mammogram: Up to date.  Continue routine screening.   Stool Guaiac Testing:  Colonoscopy ordered as she has a h/o colon polyps with need for repeat after 5 years.     Labs: CBC, CMP, Lipid panel, random glucose ordered.  Routine preventative health maintenance measures emphasized: Exercise/Diet/Weight control, Alcohol/Substance  use risks and Stress  Management  Contraception: status post hysterectomy Essential HTN managed by PCP.  Patient continues to manage vasomotor symptoms with Hoy Register. Has been previously counseled on the cautionary use of OTC remedies.  Return to [place], MD  [facility]

## 2019-06-24 NOTE — Patient Instructions (Addendum)
Health Maintenance, Female Adopting a healthy lifestyle and getting preventive care are important in promoting health and wellness. Ask your health care provider about:  The right schedule for you to have regular tests and exams.  Things you can do on your own to prevent diseases and keep yourself healthy. What should I know about diet, weight, and exercise? Eat a healthy diet   Eat a diet that includes plenty of vegetables, fruits, low-fat dairy products, and lean protein.  Do not eat a lot of foods that are high in solid fats, added sugars, or sodium. Maintain a healthy weight Body mass index (BMI) is used to identify weight problems. It estimates body fat based on height and weight. Your health care provider can help determine your BMI and help you achieve or maintain a healthy weight. Get regular exercise Get regular exercise. This is one of the most important things you can do for your health. Most adults should:  Exercise for at least 150 minutes each week. The exercise should increase your heart rate and make you sweat (moderate-intensity exercise).  Do strengthening exercises at least twice a week. This is in addition to the moderate-intensity exercise.  Spend less time sitting. Even light physical activity can be beneficial. Watch cholesterol and blood lipids Have your blood tested for lipids and cholesterol at 53 years of age, then have this test every 5 years. Have your cholesterol levels checked more often if:  Your lipid or cholesterol levels are high.  You are older than 53 years of age.  You are at high risk for heart disease. What should I know about cancer screening? Depending on your health history and family history, you may need to have cancer screening at various ages. This may include screening for:  Breast cancer.  Cervical cancer.  Colorectal cancer.  Skin cancer.  Lung cancer. What should I know about heart disease, diabetes, and high blood  pressure? Blood pressure and heart disease  High blood pressure causes heart disease and increases the risk of stroke. This is more likely to develop in people who have high blood pressure readings, are of African descent, or are overweight.  Have your blood pressure checked: ? Every 3-5 years if you are 18-39 years of age. ? Every year if you are 40 years old or older. Diabetes Have regular diabetes screenings. This checks your fasting blood sugar level. Have the screening done:  Once every three years after age 40 if you are at a normal weight and have a low risk for diabetes.  More often and at a younger age if you are overweight or have a high risk for diabetes. What should I know about preventing infection? Hepatitis B If you have a higher risk for hepatitis B, you should be screened for this virus. Talk with your health care provider to find out if you are at risk for hepatitis B infection. Hepatitis C Testing is recommended for:  Everyone born from 1945 through 1965.  Anyone with known risk factors for hepatitis C. Sexually transmitted infections (STIs)  Get screened for STIs, including gonorrhea and chlamydia, if: ? You are sexually active and are younger than 53 years of age. ? You are older than 53 years of age and your health care provider tells you that you are at risk for this type of infection. ? Your sexual activity has changed since you were last screened, and you are at increased risk for chlamydia or gonorrhea. Ask your health care provider if   you are at risk.  Ask your health care provider about whether you are at high risk for HIV. Your health care provider may recommend a prescription medicine to help prevent HIV infection. If you choose to take medicine to prevent HIV, you should first get tested for HIV. You should then be tested every 3 months for as long as you are taking the medicine. Pregnancy  If you are about to stop having your period (premenopausal) and  you may become pregnant, seek counseling before you get pregnant.  Take 400 to 800 micrograms (mcg) of folic acid every day if you become pregnant.  Ask for birth control (contraception) if you want to prevent pregnancy. Osteoporosis and menopause Osteoporosis is a disease in which the bones lose minerals and strength with aging. This can result in bone fractures. If you are 69 years old or older, or if you are at risk for osteoporosis and fractures, ask your health care provider if you should:  Be screened for bone loss.  Take a calcium or vitamin D supplement to lower your risk of fractures.  Be given hormone replacement therapy (HRT) to treat symptoms of menopause. Follow these instructions at home: Lifestyle  Do not use any products that contain nicotine or tobacco, such as cigarettes, e-cigarettes, and chewing tobacco. If you need help quitting, ask your health care provider.  Do not use street drugs.  Do not share needles.  Ask your health care provider for help if you need support or information about quitting drugs. Alcohol use  Do not drink alcohol if: ? Your health care provider tells you not to drink. ? You are pregnant, may be pregnant, or are planning to become pregnant.  If you drink alcohol: ? Limit how much you use to 0-1 drink a day. ? Limit intake if you are breastfeeding.  Be aware of how much alcohol is in your drink. In the [place] one drink equals one 12 oz bottle of beer (355 mL), one 5 oz glass of wine (148 mL), or one 1 oz glass of hard liquor (44 mL). General instructions  Schedule regular health, dental, and eye exams.  Stay current with your vaccines.  Tell your health care provider if: ? You often feel depressed. ? You have ever been abused or do not feel safe at home. Summary  Adopting a healthy lifestyle and getting preventive care are important in promoting health and wellness.  Follow your health care provider's instructions about healthy  diet, exercising, and getting tested or screened for diseases.  Follow your health care provider's instructions on monitoring your cholesterol and blood pressure. This information is not intended to replace advice given to you by your health care provider. Make sure you discuss any questions you have with your health care provider. Document Released: 05/15/2011 Document Revised: 10/23/2018 Document Reviewed: 10/23/2018 Elsevier Patient Education  2020 [place] self-awareness is knowing how your breasts look and feel. Doing breast self-awareness is important. It allows you to catch a breast problem early while it is still small and can be treated. All women should do breast self-awareness, including women who have had breast implants. Tell your doctor if you notice a change in your breasts. What you need:  A mirror.  A well-lit room. How to do a breast self-exam A breast self-exam is one way to learn what is normal for your breasts and to check for changes. To do a breast self-exam: Look for changes  1. Take off all the clothes above your waist. 2. Stand in front of a mirror in a room with good lighting. 3. Put your hands on your hips. 4. Push your hands down. 5. Look at your breasts and nipples in the mirror to see if one breast or nipple looks different from the other. Check to see if: ? The shape of one breast is different. ? The size of one breast is different. ? There are wrinkles, dips, and bumps in one breast and not the other. 6. Look at each breast for changes in the skin, such as: ? Redness. ? Scaly areas. 7. Look for changes in your nipples, such as: ? Liquid around the nipples. ? Bleeding. ? Dimpling. ? Redness. ? A change in where the nipples are. Feel for changes  1. Lie on your back on the floor. 2. Feel each breast. To do this, follow these steps: ? Pick a breast to feel. ? Put the arm closest to that breast above your  head. ? Use your other arm to feel the nipple area of your breast. Feel the area with the pads of your three middle fingers by making small circles with your fingers. For the first circle, press lightly. For the second circle, press harder. For the third circle, press even harder. ? Keep making circles with your fingers at the different pressures as you move down your breast. Stop when you feel your ribs. ? Move your fingers a little toward the center of your body. ? Start making circles with your fingers again, this time going up until you reach your collarbone. ? Keep making up-and-down circles until you reach your armpit. Remember to keep using the three pressures. ? Feel the other breast in the same way. 3. Sit or stand in the tub or shower. 4. With soapy water on your skin, feel each breast the same way you did in step 2 when you were lying on the floor. Write down what you find Writing down what you find can help you remember what to tell your doctor. Write down:  What is normal for each breast.  Any changes you find in each breast, including: ? The kind of changes you find. ? Whether you have pain. ? Size and location of any lumps.  When you last had your menstrual period. General tips  Check your breasts every month.  If you are breastfeeding, the best time to check your breasts is after you feed your baby or after you use a breast pump.  If you get menstrual periods, the best time to check your breasts is 5-7 days after your menstrual period is over.  With time, you will become comfortable with the self-exam, and you will begin to know if there are changes in your breasts. Contact a doctor if you:  See a change in the shape or size of your breasts or nipples.  See a change in the skin of your breast or nipples, such as red or scaly skin.  Have fluid coming from your nipples that is not normal.  Find a lump or thick area that was not there before.  Have pain in your  breasts.  Have any concerns about your breast health. Summary  Breast self-awareness includes looking for changes in your breasts, as well as feeling for changes within your breasts.  Breast self-awareness should be done in front of a mirror in a well-lit room.  You should check your breasts every month. If you get  menstrual periods, the best time to check your breasts is 5-7 days after your menstrual period is over.  Let your doctor know of any changes you see in your breasts, including changes in size, changes on the skin, pain or tenderness, or fluid from your nipples that is not normal. This information is not intended to replace advice given to you by your health care provider. Make sure you discuss any questions you have with your health care provider. Document Released: 04/17/2008 Document Revised: 06/18/2018 Document Reviewed: 06/18/2018 Elsevier Patient Education  2020 [employer].

## 2019-06-24 NOTE — Progress Notes (Signed)
Pt is present today for annual exam. Pt stated that she was doing well no problems.  

## 2019-06-24 NOTE — Telephone Encounter (Signed)
LVM for pt to call office in regards to colonoscopy referral.  Thanks Sharyn Lull

## 2019-06-26 ENCOUNTER — Telehealth: Payer: Self-pay

## 2019-06-26 ENCOUNTER — Other Ambulatory Visit: Payer: Self-pay

## 2019-06-26 DIAGNOSIS — Z86010 Personal history of colonic polyps: Secondary | ICD-10-CM

## 2019-06-26 DIAGNOSIS — Z1211 Encounter for screening for malignant neoplasm of colon: Secondary | ICD-10-CM

## 2019-06-26 NOTE — Telephone Encounter (Signed)
Gastroenterology Pre-Procedure Review  Request Date: 07/15/19 Requesting Physician: Dr. Allen Norris  PATIENT REVIEW QUESTIONS: The patient responded to the following health history questions as indicated:    1. Are you having any GI issues? no 2. Do you have a personal history of Polyps? yes (5 years ago when lived in [place]) 3. Do you have a family history of Colon Cancer or Polyps? no 4. Diabetes Mellitus? no 5. Joint replacements in the past 12 months?no 6. Major health problems in the past 3 months?no 7. Any artificial heart valves, MVP, or defibrillator?no    MEDICATIONS & ALLERGIES:    Patient reports the following regarding taking any anticoagulation/antiplatelet therapy:   Plavix, Coumadin, Eliquis, Xarelto, Lovenox, Pradaxa, Brilinta, or Effient? no Aspirin? no  Patient confirms/reports the following medications:  Current Outpatient Medications  Medication Sig Dispense Refill  . ALPRAZolam (XANAX) 0.25 MG tablet Take 1 tablet (0.25 mg total) by mouth at bedtime as needed. 30 tablet 2  . CA-MG-VALERIAN-PASSIF-GINGKO PO Take by mouth.    . Cholecalciferol ([facility] D3) 125 MCG (5000 UT) capsule Take 1 capsule (5,000 Units total) by mouth daily. 90 capsule 3  . cyanocobalamin 100 MCG tablet Take 100 mcg by mouth daily.    Marland Kitchen escitalopram (LEXAPRO) 10 MG tablet Take 1 tablet (10 mg total) by mouth daily. 90 tablet 3  . hydrochlorothiazide (HYDRODIURIL) 12.5 MG tablet Take 1 tablet (12.5 mg total) by mouth daily. In am 90 tablet 3  . levocetirizine (XYZAL) 5 MG tablet Take 1 tablet (5 mg total) by mouth every evening. 90 tablet 3  . montelukast (SINGULAIR) 10 MG tablet Take 1 tablet (10 mg total) by mouth at bedtime. 90 tablet 3  . Multiple Vitamin (MULTIVITAMIN) capsule Take by mouth daily.     . Nutritional Supplements (ESTROVEN PO) Take by mouth.    . pantoprazole (PROTONIX) 40 MG tablet Take 1 tablet (40 mg total) by mouth daily. 90 tablet 3  . Potassium Chloride ER 20 MEQ TBCR 20 meq daily  90 tablet 3  . telmisartan (MICARDIS) 20 MG tablet Take 1 tablet (20 mg total) by mouth daily. 90 tablet 3   No current facility-administered medications for this visit.     Patient confirms/reports the following allergies:  Allergies  Allergen Reactions  . Losartan Potassium-Hctz     Other reaction(s): Other (See Comments) Weight gain, constipation  . Tape Rash    No orders of the defined types were placed in this encounter.   AUTHORIZATION INFORMATION Primary Insurance: 1D#: Group #:  Secondary Insurance: 1D#: Group #:  SCHEDULE INFORMATION: Date: 07/15/19 Time: Location:[facility]

## 2019-07-08 ENCOUNTER — Telehealth: Payer: Self-pay | Admitting: Gastroenterology

## 2019-07-08 NOTE — Telephone Encounter (Signed)
Patient called & needs to cancel her colonoscopy for 07-15-19 with Dr Allen Norris & for the Covid test. She will call back to r/s once she gets her schedule

## 2019-07-15 ENCOUNTER — Encounter: Admission: RE | Payer: Self-pay | Source: Home / Self Care

## 2019-07-15 ENCOUNTER — Ambulatory Visit
Admission: RE | Admit: 2019-07-15 | Payer: BC Managed Care – PPO | Source: Home / Self Care | Admitting: Gastroenterology

## 2019-07-15 SURGERY — COLONOSCOPY WITH PROPOFOL
Anesthesia: General

## 2019-07-28 ENCOUNTER — Telehealth: Payer: Self-pay | Admitting: Internal Medicine

## 2019-07-28 NOTE — Telephone Encounter (Signed)
Medication Refill - Medication: mometasone (NASONEX) 50 MCG/ACT nasal spray Pt requesting 90 day refills.   Has the patient contacted their pharmacy? Yes.   (Agent: If no, request that the patient contact the pharmacy for the refill.) (Agent: If yes, when and what did the pharmacy advise?)  Preferred Pharmacy (with phone number or street name):  [facility] [place], [place] [facility] [phone] (Phone) [phone] (Fax)     Agent: Please be advised that RX refills may take up to 3 business days. We ask that you follow-up with your pharmacy.

## 2019-07-28 NOTE — Telephone Encounter (Signed)
Medication is not listed in patient's chart.  Patient said that Dr. Aundra Dubin is the original prescribing physician.  Patient said that original Rx is for 30 days.  Patient is requesting a 90 day supply.  Patient aware that Dr. Aundra Dubin is not in the office today.  Patient said that she can wait for Dr. Aundra Dubin to return to office on tomorrow to refill Rx.

## 2019-07-28 NOTE — Telephone Encounter (Signed)
Patient is requesting medication not current med list.

## 2019-07-29 ENCOUNTER — Other Ambulatory Visit: Payer: Self-pay | Admitting: Internal Medicine

## 2019-07-29 ENCOUNTER — Telehealth: Payer: Self-pay | Admitting: *Deleted

## 2019-07-29 DIAGNOSIS — J309 Allergic rhinitis, unspecified: Secondary | ICD-10-CM

## 2019-07-29 MED ORDER — MOMETASONE FUROATE 50 MCG/ACT NA SUSP: 2.0000 | Freq: Every day | NASAL | 3 refills | Status: DC

## 2019-07-29 NOTE — Telephone Encounter (Signed)
A fax request was received for MOMETASONE. Rx not found on med list or history.

## 2019-08-11 ENCOUNTER — Ambulatory Visit (INDEPENDENT_AMBULATORY_CARE_PROVIDER_SITE_OTHER): Payer: BC Managed Care – PPO

## 2019-08-11 ENCOUNTER — Other Ambulatory Visit: Payer: Self-pay

## 2019-08-11 DIAGNOSIS — Z23 Encounter for immunization: Secondary | ICD-10-CM | POA: Diagnosis not present

## 2019-09-08 ENCOUNTER — Other Ambulatory Visit: Payer: Self-pay

## 2019-09-10 ENCOUNTER — Encounter: Payer: Self-pay | Admitting: Internal Medicine

## 2019-09-10 ENCOUNTER — Ambulatory Visit (INDEPENDENT_AMBULATORY_CARE_PROVIDER_SITE_OTHER): Payer: BC Managed Care – PPO | Admitting: Internal Medicine

## 2019-09-10 ENCOUNTER — Other Ambulatory Visit: Payer: Self-pay

## 2019-09-10 VITALS — BP 126/78 | HR 62 | Temp 97.2°F | Ht 64.0 in | Wt 202.4 lb

## 2019-09-10 DIAGNOSIS — E041 Nontoxic single thyroid nodule: Secondary | ICD-10-CM | POA: Diagnosis not present

## 2019-09-10 DIAGNOSIS — E876 Hypokalemia: Secondary | ICD-10-CM

## 2019-09-10 DIAGNOSIS — Z0000 Encounter for general adult medical examination without abnormal findings: Secondary | ICD-10-CM

## 2019-09-10 DIAGNOSIS — I10 Essential (primary) hypertension: Secondary | ICD-10-CM

## 2019-09-10 DIAGNOSIS — Z23 Encounter for immunization: Secondary | ICD-10-CM

## 2019-09-10 DIAGNOSIS — J309 Allergic rhinitis, unspecified: Secondary | ICD-10-CM

## 2019-09-10 DIAGNOSIS — R946 Abnormal results of thyroid function studies: Secondary | ICD-10-CM | POA: Diagnosis not present

## 2019-09-10 DIAGNOSIS — K219 Gastro-esophageal reflux disease without esophagitis: Secondary | ICD-10-CM

## 2019-09-10 DIAGNOSIS — F419 Anxiety disorder, unspecified: Secondary | ICD-10-CM

## 2019-09-10 LAB — COMPREHENSIVE METABOLIC PANEL
ALT: 13 U/L (ref 0–35)
AST: 16 U/L (ref 0–37)
Albumin: 4.3 g/dL (ref 3.5–5.2)
Alkaline Phosphatase: 103 U/L (ref 39–117)
BUN: 11 mg/dL (ref 6–23)
CO2: 28 mEq/L (ref 19–32)
Calcium: 9.5 mg/dL (ref 8.4–10.5)
Chloride: 104 mEq/L (ref 96–112)
Creatinine, Ser: 0.73 mg/dL (ref 0.40–1.20)
GFR: 100.65 mL/min (ref >60.00)
Glucose, Bld: 76 mg/dL (ref 70–99)
Potassium: 3.8 mEq/L (ref 3.5–5.1)
Sodium: 142 mEq/L (ref 135–145)
Total Bilirubin: 0.6 mg/dL (ref 0.2–1.2)
Total Protein: 6.9 g/dL (ref 6.0–8.3)

## 2019-09-10 LAB — CBC WITH DIFFERENTIAL/PLATELET
Absolute Monocytes: 342 cells/uL (ref 200–950)
Basophils Absolute: 42 cells/uL (ref 0–200)
Basophils Relative: 0.7 %
Eosinophils Absolute: 192 cells/uL (ref 15–500)
Eosinophils Relative: 3.2 %
HCT: 40 % (ref 35.0–45.0)
Hemoglobin: 13 g/dL (ref 11.7–15.5)
Lymphs Abs: 3096 cells/uL (ref 850–3900)
MCH: 30.3 pg (ref 27.0–33.0)
MCHC: 32.5 g/dL (ref 32.0–36.0)
MCV: 93.2 fL (ref 80.0–100.0)
MPV: 10.8 fL (ref 7.5–12.5)
Monocytes Relative: 5.7 %
Neutro Abs: 2328 cells/uL (ref 1500–7800)
Neutrophils Relative %: 38.8 %
Platelets: 176 10*3/uL (ref 140–400)
RBC: 4.29 10*6/uL (ref 3.80–5.10)
RDW: 13.3 % (ref 11.0–15.0)
Total Lymphocyte: 51.6 %
WBC: 6 10*3/uL (ref 3.8–10.8)

## 2019-09-10 LAB — LIPID PANEL
Cholesterol: 202 mg/dL — ABNORMAL HIGH (ref 0–200)
HDL: 47.7 mg/dL (ref >39.00)
LDL Cholesterol: 131 mg/dL — ABNORMAL HIGH (ref 0–99)
NonHDL: 153.94
Total CHOL/HDL Ratio: 4
Triglycerides: 117 mg/dL (ref 0.0–149.0)
VLDL: 23.4 mg/dL (ref 0.0–40.0)

## 2019-09-10 MED ORDER — ALPRAZOLAM 0.25 MG PO TABS: 0.2500 mg | ORAL_TABLET | Freq: Every evening | ORAL | 2 refills | Status: DC | PRN

## 2019-09-10 MED ORDER — POTASSIUM CHLORIDE ER 20 MEQ PO TBCR: EXTENDED_RELEASE_TABLET | ORAL | 3 refills | Status: DC

## 2019-09-10 MED ORDER — TELMISARTAN 20 MG PO TABS: 20.0000 mg | ORAL_TABLET | Freq: Every day | ORAL | 3 refills | Status: DC

## 2019-09-10 MED ORDER — LEVOCETIRIZINE DIHYDROCHLORIDE 5 MG PO TABS: 5.0000 mg | ORAL_TABLET | Freq: Every evening | ORAL | 3 refills | Status: DC

## 2019-09-10 MED ORDER — ESCITALOPRAM OXALATE 10 MG PO TABS: 10.0000 mg | ORAL_TABLET | Freq: Every day | ORAL | 3 refills | Status: DC

## 2019-09-10 MED ORDER — HYDROCHLOROTHIAZIDE 12.5 MG PO TABS: 12.5000 mg | ORAL_TABLET | Freq: Every day | ORAL | 3 refills | Status: DC

## 2019-09-10 MED ORDER — PANTOPRAZOLE SODIUM 40 MG PO TBEC: 40.0000 mg | DELAYED_RELEASE_TABLET | Freq: Every day | ORAL | 3 refills | Status: DC

## 2019-09-10 MED ORDER — MOMETASONE FUROATE 50 MCG/ACT NA SUSP: 2.0000 | Freq: Every day | NASAL | 3 refills | Status: DC

## 2019-09-10 NOTE — Patient Instructions (Addendum)
2nd dose shingrix due in 2 to < 6 months   Scheduled thyroid US    Recombinant Zoster (Shingles) Vaccine: What You Need to Know 1. Why get vaccinated? Recombinant zoster (shingles) vaccine can prevent shingles. Shingles (also called herpes zoster, or just zoster) is a painful skin rash, usually with blisters. In addition to the rash, shingles can cause fever, headache, chills, or upset stomach. More rarely, shingles can lead to pneumonia, hearing problems, blindness, brain inflammation (encephalitis), or death. The most common complication of shingles is long-term nerve pain called postherpetic neuralgia (PHN). PHN occurs in the areas where the shingles rash was, even after the rash clears up. It can last for months or years after the rash goes away. The pain from PHN can be severe and debilitating. About 10 to 18% of people who get shingles will experience PHN. The risk of PHN increases with age. An older adult with shingles is more likely to develop PHN and have longer lasting and more severe pain than a younger person with shingles. Shingles is caused by the varicella zoster virus, the same virus that causes chickenpox. After you have chickenpox, the virus stays in your body and can cause shingles later in life. Shingles cannot be passed from one person to another, but the virus that causes shingles can spread and cause chickenpox in someone who had never had chickenpox or received chickenpox vaccine. 2. Recombinant shingles vaccine Recombinant shingles vaccine provides strong protection against shingles. By preventing shingles, recombinant shingles vaccine also protects against PHN. Recombinant shingles vaccine is the preferred vaccine for the prevention of shingles. However, a different vaccine, live shingles vaccine, may be used in some circumstances. The recombinant shingles vaccine is recommended for adults 50 years and older without serious immune problems. It is given as a two-dose  series. This vaccine is also recommended for people who have already gotten another type of shingles vaccine, the live shingles vaccine. There is no live virus in this vaccine. Shingles vaccine may be given at the same time as other vaccines. 3. Talk with your health care provider Tell your vaccine provider if the person getting the vaccine:  Has had an allergic reaction after a previous dose of recombinant shingles vaccine, or has any severe, life-threatening allergies.  Is pregnant or breastfeeding.  Is currently experiencing an episode of shingles. In some cases, your health care provider may decide to postpone shingles vaccination to a future visit. People with minor illnesses, such as a cold, may be vaccinated. People who are moderately or severely ill should usually wait until they recover before getting recombinant shingles vaccine. Your health care provider can give you more information. 4. Risks of a vaccine reaction  A sore arm with mild or moderate pain is very common after recombinant shingles vaccine, affecting about 80% of vaccinated people. Redness and swelling can also happen at the site of the injection.  Tiredness, muscle pain, headache, shivering, fever, stomach pain, and nausea happen after vaccination in more than half of people who receive recombinant shingles vaccine. In clinical trials, about 1 out of 6 people who got recombinant zoster vaccine experienced side effects that prevented them from doing regular activities. Symptoms usually went away on their own in 2 to 3 days. You should still get the second dose of recombinant zoster vaccine even if you had one of these reactions after the first dose. People sometimes faint after medical procedures, including vaccination. Tell your provider if you feel dizzy or have vision changes or  ringing in the ears. As with any medicine, there is a very remote chance of a vaccine causing a severe allergic reaction, other serious  injury, or death. 5. What if there is a serious problem? An allergic reaction could occur after the vaccinated person leaves the clinic. If you see signs of a severe allergic reaction (hives, swelling of the face and throat, difficulty breathing, a fast heartbeat, dizziness, or weakness), call 9-1-1 and get the person to the nearest hospital. For other signs that concern you, call your health care provider. Adverse reactions should be reported to the Vaccine Adverse Event Reporting System (VAERS). Your health care provider will usually file this report, or you can do it yourself. Visit the VAERS website at www.vaers.SamedayNews.es or call 782 086 2489. VAERS is only for reporting reactions, and VAERS staff do not give medical advice. 6. How can I learn more?  Ask your health care provider.  Call your local or state health department.  Contact the Centers for Disease Control and Prevention (CDC): ? Call 702-455-2572 (1-800-CDC-INFO) or ? Visit CDC's website at http://hunter.com/ Vaccine Information Statement Recombinant Zoster Vaccine (09/11/2018) This information is not intended to replace advice given to you by your health care provider. Make sure you discuss any questions you have with your health care provider. Document Released: 01/09/2017 Document Revised: 02/18/2019 Document Reviewed: 06/05/2018 Elsevier Patient Education  2020 [employer].

## 2019-09-10 NOTE — Progress Notes (Signed)
Chief Complaint  Patient presents with  . Follow-up   Annual  1. HTN controlled on micardis 20 mg qd and hctz 12.5 mg qd with K due to low K  2. Tobacco abuse light quit 2.5 months ago  3. Thyroid nodule need to repeat thyroid US    Review of Systems  Constitutional: Positive for weight loss.       Down 5 lbs   HENT: Negative for hearing loss.   Eyes: Negative for blurred vision.  Respiratory: Negative for shortness of breath.   Cardiovascular: Negative for chest pain.  Gastrointestinal: Negative for abdominal pain and blood in stool.  Musculoskeletal: Negative for falls.  Skin: Negative for rash.  Neurological: Negative for headaches.  Psychiatric/Behavioral: Negative for depression.   Past Medical History:  Diagnosis Date  . Abnormal thyroid function test   . Anxiety   . 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 Colon polyp 2014  . Enlarged thyroid   . Hypertension   . UTI (urinary tract infection)    Past Surgical History:  Procedure Laterality Date  . ABDOMINAL HYSTERECTOMY     2009  . BREAST BIOPSY Right 2003  . CARPAL TUNNEL RELEASE    . FOOT SURGERY     right  . TONSILLECTOMY     1987  . UNILATERAL SALPINGECTOMY     left   Family History  Problem Relation Age of Onset  . Hypertension Mother   . Hyperlipidemia Mother   . Hypertension Father   . Breast cancer Neg Hx   . Ovarian cancer Neg Hx   . Stroke Neg Hx   . Thyroid disease Neg Hx    Social History   Socioeconomic History  . Marital status: Married    Spouse name: Not on file  . Number of children: Not on file  . Years of education: Not on file  . Highest education level: Not on file  Occupational History  . Not on file  Social Needs  . Financial resource strain: Not on file  . Food insecurity    Worry: Not on file    Inability: Not on file  . Transportation needs    Medical: Not on file    Non-medical: Not on file  Tobacco Use  . Smoking status: Former Smoker     Packs/day: 0.25  . Smokeless tobacco: Never Used  Substance and Sexual Activity  . Alcohol use: Yes    Comment: socially  . Drug use: No  . Sexual activity: Yes    Birth control/protection: Surgical  Lifestyle  . Physical activity    Days per week: Not on file    Minutes per session: Not on file  . Stress: Not on file  Relationships  . Social Herbalist on phone: Not on file    Gets together: Not on file    Attends religious service: Not on file    Active member of club or organization: Not on file    Attends meetings of clubs or organizations: Not on file    Relationship status: Not on file  . Intimate partner violence    Fear of current or ex partner: Not on file    Emotionally abused: Not on file    Physically abused: Not on file    Forced sexual activity: Not on file  Other Topics Concern  . 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   Allergies  Allergen Reactions  . Losartan Potassium-Hctz     Other reaction(s): Other (See Comments) Weight gain, constipation  . Tape Rash   No results found for this or any previous visit (from the past 2160 hour(s)). Objective  Body mass index is 34.74 kg/m. Wt Readings from Last 3 Encounters:  09/10/19 202 lb 6.4 oz (91.8 kg)  06/24/19 207 lb 4.8 oz (94 kg)  01/23/19 204 lb (92.5 kg)   Temp Readings from Last 3 Encounters:  09/10/19 (!) 97.2 F (36.2 C) (Skin)  01/23/19 98.2 F (36.8 C) (Oral)  12/26/18 98.8 F (37.1 C) (Oral)   BP Readings from Last 3 Encounters:  09/10/19 126/78  06/24/19 122/82  02/20/19 125/82   Pulse Readings from  Last 3 Encounters:  09/10/19 62  06/24/19 60  01/23/19 60    Physical Exam Vitals signs and nursing note reviewed.  Constitutional:      Appearance: Normal appearance. She is well-developed and well-groomed. She is obese.     Comments: +mask on    HENT:     Head: Normocephalic and atraumatic.  Eyes:     Conjunctiva/sclera: Conjunctivae normal.     Pupils: Pupils are equal, round, and reactive to light.  Cardiovascular:     Rate and Rhythm: Normal rate and regular rhythm.     Heart sounds: Normal heart sounds. No murmur.  Pulmonary:     Effort: Pulmonary effort is normal.     Breath sounds: Normal breath sounds.  Abdominal:     General: Abdomen is flat. Bowel sounds are normal.     Tenderness: There is no abdominal tenderness.  Skin:    General: Skin is warm and dry.  Neurological:     General: No focal deficit present.     Mental Status: She is alert and oriented to person, place, and time. Mental status is at baseline.     Gait: Gait normal.  Psychiatric:        Attention and Perception: Attention and perception normal.        Mood and Affect: Mood and affect normal.        Speech: Speech normal.        Behavior: Behavior normal. Behavior is cooperative.        Thought Content: Thought content normal.        Cognition and Memory: Cognition and memory normal.        Judgment: Judgment normal.     Assessment  Plan  Annual physical exam Flu shothad 08/11/19 Tdap had 03/30/17 Consider pna 23 but quit 2.5 months since 09/10/19 will hold until age 17 y.o  Consider shingrix 1/2 given today   Mammogram neg 7/22/2020negative   Pap appears not done saw Dr. Marcelline Mates 04/09/18 per pt had pap per but Dr. Dessie Coma notes no pap s/p hysterectomy no h/o abnormal papand no need further pap  Colonoscopy h/o polyps need to get record [facility] last colonoscopy in [place] echo and colonoscopy -09/10/19 wants to hold on colonoscopy for  now   -of note also need echo report -2nd request  Smoker 2 cig/qd stopped 7 or 06/2019 started in 20slight smoker   rec healthy diet and exercise    Thyroid nodule with abnormal tfts - Plan: US THYROID, TSH, T4, free  Essential hypertension controlled - Plan: Comprehensive metabolic panel, CBC with Differential/Platelet, Lipid panel, telmisartan (MICARDIS) 20 MG tablet, hydrochlorothiazide (HYDRODIURIL) 12.5 MG tablet  Hypokalemia - Plan: Potassium Chloride ER 20 MEQ TBCR  Gastroesophageal reflux disease - Plan: pantoprazole (PROTONIX) 40 MG tablet  Allergic rhinitis, unspecified seasonality, unspecified trigger - Plan: mometasone (NASONEX) 50 MCG/ACT nasal spray, levocetirizine (XYZAL) 5 MG tablet No using singulair   Anxiety - Plan: escitalopram (LEXAPRO) 10 MG tablet, ALPRAZolam (XANAX) 0.25 MG tablet   US thyroid 08/16/18 1.1 cm left thyroid nodule f/u in 1 year-consider order in future   Provider: Dr. Olivia Mackie McLean-Scocuzza-Internal Medicine

## 2019-09-10 NOTE — Addendum Note (Signed)
Addended by: Leeanne Rio on: 09/10/2019 10:28 AM   Modules accepted: Orders

## 2019-09-11 LAB — T4, FREE: Free T4: 0.88 ng/dL (ref 0.60–1.60)

## 2019-09-11 LAB — TSH: TSH: 0.7 u[IU]/mL (ref 0.35–4.50)

## 2019-09-22 ENCOUNTER — Other Ambulatory Visit: Payer: Self-pay

## 2019-09-22 ENCOUNTER — Ambulatory Visit
Admission: RE | Admit: 2019-09-22 | Discharge: 2019-09-22 | Disposition: A | Discharge status: Home / Self Care | Payer: BC Managed Care – PPO | Source: Ambulatory Visit | Attending: Internal Medicine | Admitting: Internal Medicine

## 2019-09-22 DIAGNOSIS — E042 Nontoxic multinodular goiter: Secondary | ICD-10-CM | POA: Diagnosis not present

## 2019-09-22 DIAGNOSIS — E041 Nontoxic single thyroid nodule: Secondary | ICD-10-CM | POA: Insufficient documentation

## 2019-11-17 ENCOUNTER — Encounter: Payer: Self-pay | Admitting: Family Medicine

## 2019-11-17 ENCOUNTER — Ambulatory Visit (INDEPENDENT_AMBULATORY_CARE_PROVIDER_SITE_OTHER): Payer: BC Managed Care – PPO | Admitting: Family Medicine

## 2019-11-17 VITALS — BP 157/91 | HR 68 | Temp 95.4°F

## 2019-11-17 DIAGNOSIS — Z20822 Contact with and (suspected) exposure to covid-19: Secondary | ICD-10-CM

## 2019-11-17 NOTE — Progress Notes (Signed)
I connected with Kelly Pittman on 11/17/19 at  9:00 AM EST by video and verified that I am speaking with the correct person using two identifiers.   I discussed the limitations, risks, security and privacy concerns of performing an evaluation and management service by video and the availability of in person appointments. I also discussed with the patient that there may be a patient responsible charge related to this service. The patient expressed understanding and agreed to proceed.  Patient location: Home Provider Location: [facility], [place] Participants: Kelly Pittman and Kelly Pittman   Subjective:     Kelly Pittman is a 54 y.o. female presenting for Sore Throat (started on 11/13/2019. Ear pain-left. Chills.)     Sore Throat  This is a new problem. The current episode started in the past 7 days. The pain is moderate. Associated symptoms include coughing, ear pain, headaches (improve), a hoarse voice and trouble swallowing. Pertinent negatives include no congestion, diarrhea, shortness of breath, swollen glands or vomiting. Treatments tried: alkaseltzer plus.   Sick contact: no Loss of taste/smell: no  Right before christmas has sore throat and ear pain which resolved  Has not gotten tested for covid  Tea w/ lemon  Review of Systems  Constitutional: Positive for chills. Negative for fever.  HENT: Positive for ear pain, hoarse voice, postnasal drip, rhinorrhea, sneezing, sore throat, trouble swallowing and voice change. Negative for congestion.   Eyes: Negative for pain and itching.  Respiratory: Positive for cough. Negative for shortness of breath.   Gastrointestinal: Positive for nausea. Negative for diarrhea and vomiting.  Neurological: Positive for headaches (improve).     Social History   Tobacco Use  Smoking Status Former Smoker  . Packs/day: 0.25  Smokeless Tobacco Never Used        Objective:   BP Readings from Last 3  Encounters:  11/17/19 (!) 157/91  09/10/19 126/78  06/24/19 122/82   Wt Readings from Last 3 Encounters:  09/10/19 202 lb 6.4 oz (91.8 kg)  06/24/19 207 lb 4.8 oz (94 kg)  01/23/19 204 lb (92.5 kg)     BP (!) 157/91 Comment: per patient  Pulse 68 Comment: per patient  Temp (!) 95.4 F (35.2 C) Comment: per patient   Physical Exam Constitutional:      Appearance: Normal appearance. She is not ill-appearing.  HENT:     Head: Normocephalic and atraumatic.     Right Ear: External ear normal.     Left Ear: External ear normal.     Mouth/Throat:     Pharynx: Posterior oropharyngeal erythema present. No pharyngeal swelling or oropharyngeal exudate.  Eyes:     Conjunctiva/sclera: Conjunctivae normal.  Pulmonary:     Effort: Pulmonary effort is normal. No respiratory distress.  Neurological:     Mental Status: She is alert. Mental status is at baseline.  Psychiatric:        Mood and Affect: Mood normal.        Behavior: Behavior normal.        Thought Content: Thought content normal.        Judgment: Judgment normal.           Assessment & Plan:   Problem List Items Addressed This Visit    None    Visit Diagnoses    Suspected COVID-19 virus infection    -  Primary     Discussed OTC treatment for viral illness Given symptoms possible covid-19 and would recommend being  tested ER precautions given Advised staying out of work and isolating until results have come back Information for contacting [facility] for scheduled testing provided  Call back if not improved and could consider abx for sinus infection  Return if symptoms worsen or fail to improve.  Kelly Noe, MD

## 2019-11-18 ENCOUNTER — Ambulatory Visit (INDEPENDENT_AMBULATORY_CARE_PROVIDER_SITE_OTHER): Payer: BC Managed Care – PPO | Admitting: Family Medicine

## 2019-11-18 ENCOUNTER — Encounter: Payer: Self-pay | Admitting: Family Medicine

## 2019-11-18 ENCOUNTER — Ambulatory Visit: Payer: BC Managed Care – PPO | Attending: Internal Medicine

## 2019-11-18 ENCOUNTER — Other Ambulatory Visit: Payer: Self-pay

## 2019-11-18 DIAGNOSIS — I10 Essential (primary) hypertension: Secondary | ICD-10-CM | POA: Diagnosis not present

## 2019-11-18 DIAGNOSIS — J989 Respiratory disorder, unspecified: Secondary | ICD-10-CM | POA: Diagnosis not present

## 2019-11-18 DIAGNOSIS — Z20822 Contact with and (suspected) exposure to covid-19: Secondary | ICD-10-CM

## 2019-11-18 MED ORDER — AMOXICILLIN-POT CLAVULANATE 875-125 MG PO TABS: 1.0000 | ORAL_TABLET | Freq: Two times a day (BID) | ORAL | 0 refills | Status: DC

## 2019-11-18 NOTE — Assessment & Plan Note (Signed)
Concern for COVID-19.  Patient underwent testing today.  I discussed that it could be 3 to 5 days before results come back.  Discussed strict quarantine precautions at home.  Discussed that she should avoid anybody in her household and if she has to be around anyone she should wear a mask.  Advised she should not leave the home and remain under quarantine until we release her.  Discussed that there is the potential that her symptoms could be related to a sinus infection or ear infection given that she had previous symptoms that improved and then recurred.  We will treat with Augmentin.  She will have somebody outside of her household pick this up and drop it at her door.  She can continue Tylenol for any discomfort and sore throat.

## 2019-11-18 NOTE — Assessment & Plan Note (Addendum)
Worsened compared to baseline.  BP elevated at home though the patient had taken a decongestant which could be contributing.  She is also ill.  Discussed continuing to monitor and avoid decongestants.  If her blood pressure goes up further she will contact us.  If it does not trend down if she starts to feel better she will let us know.

## 2019-11-18 NOTE — Progress Notes (Signed)
Virtual Visit via telephone Note  This visit type was conducted due to national recommendations for restrictions regarding the COVID-19 pandemic (e.g. social distancing).  This format is felt to be most appropriate for this patient at this time.  All issues noted in this document were discussed and addressed.  No physical exam was performed (except for noted visual exam findings with Video Visits).   I connected with Kelly Pittman today at 12:30 PM EST by telephone and verified that I am speaking with the correct person using two identifiers. Location patient: home Location provider: work  Persons participating in the virtual visit: patient, provider  I discussed the limitations, risks, security and privacy concerns of performing an evaluation and management service by telephone and the availability of in person appointments. I also discussed with the patient that there may be a patient responsible charge related to this service. The patient expressed understanding and agreed to proceed.   Reason for visit: same day visit  HPI: COVID-19 symptoms: Patient notes onset of symptoms on 11/13/2019.  Started with headache and left ear pain as well as sore throat.  She felt very lethargic.  She does report having similar symptoms for several days prior to Christmas though those improved with rest.  She had a COVID-19 test today.  No contacts with COVID-19.  No sick contacts.  She has had some chills though no fevers.  Some postnasal drip and congestion on the left.  She does have a history of sinus infections.  She notes bilateral ear pain at this time.  She does note her blood pressure went up yesterday to 157/97.  She had taken Alka-Seltzer plus which does have phenylephrine in it.  She switched to Tylenol and stop the Alka-Seltzer plus.  She has been taking her blood pressure medications.  No chest pain or shortness of breath.  She does note some TMJ discomfort with opening her jaw that started last  night.   ROS: See pertinent positives and negatives per HPI.  Past Medical History:  Diagnosis Date  . Abnormal thyroid function test   . Anxiety   . 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 Colon polyp 2014  . Enlarged thyroid   . Hypertension   . UTI (urinary tract infection)     Past Surgical History:  Procedure Laterality Date  . ABDOMINAL HYSTERECTOMY     2009  . BREAST BIOPSY Right 2003  . CARPAL TUNNEL RELEASE    . FOOT SURGERY     right  . TONSILLECTOMY     1987  . UNILATERAL SALPINGECTOMY     left    Family History  Problem Relation Age of Onset  . Hypertension Mother   . Hyperlipidemia Mother   . Hypertension Father   . Breast cancer Neg Hx   . Ovarian cancer Neg Hx   . Stroke Neg Hx   . Thyroid disease Neg Hx     SOCIAL HX: Former smoker   Current Outpatient Medications:  .  ALPRAZolam (XANAX) 0.25 MG tablet, Take 1 tablet (0.25 mg total) by mouth at bedtime as needed., Disp: 30 tablet, Rfl: 2 .  CA-MG-VALERIAN-PASSIF-GINGKO PO, Take by mouth., Disp: , Rfl:  .  Cholecalciferol ([facility] D3) 125 MCG (5000 UT) capsule, Take 1 capsule (5,000 Units total) by mouth daily., Disp: 90 capsule, Rfl: 3 .  cyanocobalamin 100 MCG tablet, Take 100 mcg by mouth daily., Disp: , Rfl:  .  escitalopram (LEXAPRO) 10 MG  tablet, Take 1 tablet (10 mg total) by mouth daily., Disp: 90 tablet, Rfl: 3 .  hydrochlorothiazide (HYDRODIURIL) 12.5 MG tablet, Take 1 tablet (12.5 mg total) by mouth daily. In am, Disp: 90 tablet, Rfl: 3 .  levocetirizine (XYZAL) 5 MG tablet, Take 1 tablet (5 mg total) by mouth every evening., Disp: 90 tablet, Rfl: 3 .  mometasone (NASONEX) 50 MCG/ACT nasal spray, Place 2 sprays into the nose daily. 1x, Disp: 51 g, Rfl: 3 .  Multiple Vitamin (MULTIVITAMIN) capsule, Take by mouth daily. , Disp: , Rfl:  .  Nutritional Supplements (ESTROVEN PO), Take by mouth., Disp: , Rfl:  .  pantoprazole (PROTONIX) 40 MG tablet, Take 1 tablet (40 mg  total) by mouth daily., Disp: 90 tablet, Rfl: 3 .  Potassium Chloride ER 20 MEQ TBCR, 20 meq daily, Disp: 90 tablet, Rfl: 3 .  telmisartan (MICARDIS) 20 MG tablet, Take 1 tablet (20 mg total) by mouth daily., Disp: 90 tablet, Rfl: 3 .  amoxicillin-clavulanate (AUGMENTIN) 875-125 MG tablet, Take 1 tablet by mouth 2 (two) times daily., Disp: 14 tablet, Rfl: 0  EXAM: This is a telehealth telephone visit and thus no physical exam was completed.  ASSESSMENT AND PLAN:  Discussed the following assessment and plan:  Essential hypertension Worsened compared to baseline.  BP elevated at home though the patient had taken a decongestant which could be contributing.  She is also ill.  Discussed continuing to monitor and avoid decongestants.  If her blood pressure goes up further she will contact us.  If it does not trend down if she starts to feel better she will let us know.  Respiratory illness Concern for COVID-19.  Patient underwent testing today.  I discussed that it could be 3 to 5 days before results come back.  Discussed strict quarantine precautions at home.  Discussed that she should avoid anybody in her household and if she has to be around anyone she should wear a mask.  Advised she should not leave the home and remain under quarantine until we release her.  Discussed that there is the potential that her symptoms could be related to a sinus infection or ear infection given that she had previous symptoms that improved and then recurred.  We will treat with Augmentin.  She will have somebody outside of her household pick this up and drop it at her door.  She can continue Tylenol for any discomfort and sore throat.   No orders of the defined types were placed in this encounter.   Meds ordered this encounter  Medications  . amoxicillin-clavulanate (AUGMENTIN) 875-125 MG tablet    Sig: Take 1 tablet by mouth 2 (two) times daily.    Dispense:  14 tablet    Refill:  0     I discussed the  assessment and treatment plan with the patient. The patient was provided an opportunity to ask questions and all were answered. The patient agreed with the plan and demonstrated an understanding of the instructions.   The patient was advised to call back or seek an in-person evaluation if the symptoms worsen or if the condition fails to improve as anticipated.  I provided 17 minutes of non-face-to-face time during this encounter.   Tommi Rumps, MD

## 2019-11-20 ENCOUNTER — Telehealth: Payer: Self-pay

## 2019-11-20 LAB — NOVEL CORONAVIRUS, NAA: SARS-CoV-2, NAA: NOT DETECTED

## 2019-11-20 NOTE — Telephone Encounter (Signed)
Caller given negative result and verbalized understanding  

## 2019-12-01 ENCOUNTER — Telehealth: Payer: Self-pay

## 2019-12-01 MED ORDER — FLUCONAZOLE 150 MG PO TABS: 150.0000 mg | ORAL_TABLET | Freq: Once | ORAL | 0 refills | Status: DC

## 2019-12-01 NOTE — Telephone Encounter (Signed)
S/p atb for uri. Now having vaginal itching. Requesting diflucan.  [facility] [place].   Pls advise.

## 2019-12-01 NOTE — Telephone Encounter (Signed)
Patient called to check status on prescription.  Patient advised nurse has 24hrs to respond and that I would send a message as well.

## 2019-12-01 NOTE — Telephone Encounter (Signed)
Pt is aware that her medication refill has been sent to her pharmacy.

## 2019-12-02 ENCOUNTER — Other Ambulatory Visit: Payer: Self-pay

## 2019-12-02 ENCOUNTER — Telehealth: Payer: Self-pay | Admitting: Internal Medicine

## 2019-12-02 DIAGNOSIS — J309 Allergic rhinitis, unspecified: Secondary | ICD-10-CM

## 2019-12-02 MED ORDER — MOMETASONE FUROATE 50 MCG/ACT NA SUSP: 2.0000 | Freq: Every day | NASAL | 3 refills | Status: DC

## 2019-12-02 NOTE — Telephone Encounter (Signed)
Pt would like a refill on mometasone (NASONEX) 50 MCG/ACT nasal spray [facility] [place] for a 90 day supply. Pt only has one more day left of spray.

## 2019-12-02 NOTE — Telephone Encounter (Signed)
Medication has been refilled.

## 2019-12-03 ENCOUNTER — Ambulatory Visit: Payer: BC Managed Care – PPO | Admitting: Internal Medicine

## 2019-12-16 ENCOUNTER — Telehealth: Payer: Self-pay | Admitting: Obstetrics and Gynecology

## 2019-12-16 NOTE — Telephone Encounter (Signed)
Pt called in and stated that she is still having some issues and would like some more  diaflocan sent in the same pharmacy. Please advise

## 2019-12-17 ENCOUNTER — Other Ambulatory Visit: Payer: Self-pay | Admitting: Obstetrics and Gynecology

## 2019-12-17 NOTE — Telephone Encounter (Signed)
Pt is aware that her medication was sent to the pharmacy.

## 2019-12-17 NOTE — Telephone Encounter (Signed)
Pt is calling back she hasnt had a return call from yesterday. The pt is needing a refill on her diaflocan sent to the same pharmacy. Pt is requesting a called back

## 2020-02-11 ENCOUNTER — Encounter: Payer: Self-pay | Admitting: Internal Medicine

## 2020-02-11 ENCOUNTER — Other Ambulatory Visit: Payer: Self-pay

## 2020-02-11 ENCOUNTER — Ambulatory Visit (INDEPENDENT_AMBULATORY_CARE_PROVIDER_SITE_OTHER): Payer: BC Managed Care – PPO | Admitting: Internal Medicine

## 2020-02-11 VITALS — BP 122/82 | HR 60 | Temp 96.4°F | Ht 64.0 in | Wt 208.8 lb

## 2020-02-11 DIAGNOSIS — J309 Allergic rhinitis, unspecified: Secondary | ICD-10-CM | POA: Diagnosis not present

## 2020-02-11 DIAGNOSIS — F419 Anxiety disorder, unspecified: Secondary | ICD-10-CM

## 2020-02-11 DIAGNOSIS — I10 Essential (primary) hypertension: Secondary | ICD-10-CM

## 2020-02-11 DIAGNOSIS — Z1389 Encounter for screening for other disorder: Secondary | ICD-10-CM | POA: Diagnosis not present

## 2020-02-11 DIAGNOSIS — I7300 Raynaud's syndrome without gangrene: Secondary | ICD-10-CM

## 2020-02-11 DIAGNOSIS — Z1231 Encounter for screening mammogram for malignant neoplasm of breast: Secondary | ICD-10-CM

## 2020-02-11 HISTORY — DX: Anxiety disorder, unspecified: F41.9

## 2020-02-11 LAB — CBC WITH DIFFERENTIAL/PLATELET
Basophils Absolute: 0.1 10*3/uL (ref 0.0–0.1)
Basophils Relative: 0.9 % (ref 0.0–3.0)
Eosinophils Absolute: 0.2 10*3/uL (ref 0.0–0.7)
Eosinophils Relative: 2.8 % (ref 0.0–5.0)
HCT: 37.6 % (ref 36.0–46.0)
Hemoglobin: 12.4 g/dL (ref 12.0–15.0)
Lymphocytes Relative: 47.4 % — ABNORMAL HIGH (ref 12.0–46.0)
Lymphs Abs: 2.9 10*3/uL (ref 0.7–4.0)
MCHC: 33.1 g/dL (ref 30.0–36.0)
MCV: 92.7 fl (ref 78.0–100.0)
Monocytes Absolute: 0.3 10*3/uL (ref 0.1–1.0)
Monocytes Relative: 5.7 % (ref 3.0–12.0)
Neutro Abs: 2.6 10*3/uL (ref 1.4–7.7)
Neutrophils Relative %: 43.2 % (ref 43.0–77.0)
Platelets: 296 10*3/uL (ref 150.0–400.0)
RBC: 4.05 Mil/uL (ref 3.87–5.11)
RDW: 13.9 % (ref 11.5–15.5)
WBC: 6.1 10*3/uL (ref 4.0–10.5)

## 2020-02-11 LAB — COMPREHENSIVE METABOLIC PANEL
ALT: 11 U/L (ref 0–35)
AST: 14 U/L (ref 0–37)
Albumin: 4.2 g/dL (ref 3.5–5.2)
Alkaline Phosphatase: 101 U/L (ref 39–117)
BUN: 9 mg/dL (ref 6–23)
CO2: 32 mEq/L (ref 19–32)
Calcium: 9.4 mg/dL (ref 8.4–10.5)
Chloride: 103 mEq/L (ref 96–112)
Creatinine, Ser: 0.75 mg/dL (ref 0.40–1.20)
GFR: 97.41 mL/min (ref >60.00)
Glucose, Bld: 85 mg/dL (ref 70–99)
Potassium: 3.8 mEq/L (ref 3.5–5.1)
Sodium: 141 mEq/L (ref 135–145)
Total Bilirubin: 0.6 mg/dL (ref 0.2–1.2)
Total Protein: 6.8 g/dL (ref 6.0–8.3)

## 2020-02-11 LAB — LIPID PANEL
Cholesterol: 196 mg/dL (ref 0–200)
HDL: 45.4 mg/dL (ref >39.00)
LDL Cholesterol: 130 mg/dL — ABNORMAL HIGH (ref 0–99)
NonHDL: 150.33
Total CHOL/HDL Ratio: 4
Triglycerides: 101 mg/dL (ref 0.0–149.0)
VLDL: 20.2 mg/dL (ref 0.0–40.0)

## 2020-02-11 MED ORDER — MOMETASONE FUROATE 50 MCG/ACT NA SUSP: 2.0000 | Freq: Every day | NASAL | 11 refills | Status: DC

## 2020-02-11 MED ORDER — ALPRAZOLAM 0.25 MG PO TABS: 0.2500 mg | ORAL_TABLET | Freq: Every evening | ORAL | 5 refills | Status: DC | PRN

## 2020-02-11 NOTE — Progress Notes (Signed)
Chief Complaint  Patient presents with  . Follow-up   F/u  1. HTN controlled on hctz 12.5 mg qd and micardis 20 mg qd not checking BP at home no h/a/no dizziness  2raynauds and hand pain worse in winter seen Dr.Bock 04/2019 and no further w/u tx cold avoidance    Review of Systems  Constitutional: Negative for weight loss.  HENT: Negative for hearing loss.   Eyes: Negative for blurred vision.  Respiratory: Negative for shortness of breath.   Cardiovascular: Positive for leg swelling. Negative for chest pain.  Gastrointestinal: Negative for abdominal pain.  Musculoskeletal: Negative for falls.  Skin: Negative for rash.  Neurological: Negative for dizziness and headaches.  Psychiatric/Behavioral: Negative for depression.   Past Medical History:  Diagnosis Date  . Abnormal thyroid function test   . Anxiety   . 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 Colon polyp 2014  . Enlarged thyroid   . Hypertension   . UTI (urinary tract infection)    Past Surgical History:  Procedure Laterality Date  . ABDOMINAL HYSTERECTOMY     2009  . BREAST BIOPSY Right 2003  . CARPAL TUNNEL RELEASE    . FOOT SURGERY     right  . TONSILLECTOMY     1987  . UNILATERAL SALPINGECTOMY     left   Family History  Problem Relation Age of Onset  . Hypertension Mother   . Hyperlipidemia Mother   . Hypertension Father   . Breast cancer Neg Hx   . Ovarian cancer Neg Hx   . Stroke Neg Hx   . Thyroid disease Neg Hx    Social History   Socioeconomic History  . Marital status: Married    Spouse name: Not on file  . Number of children: Not on file  . Years of education: Not on file  . Highest education level: Not on file  Occupational History  . Not on file  Tobacco Use  . Smoking status: Former Smoker    Packs/day: 0.25  . Smokeless tobacco: Never Used  Substance and Sexual Activity  . Alcohol use: Yes    Comment: socially  . Drug use: No  . Sexual activity: Yes     Birth control/protection: Surgical  Other Topics Concern  . 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 Difficulty of Paying Living Expenses:   Food Insecurity:   . Worried About Charity fundraiser in the Last Year:   . Arboriculturist in the Last Year:   Transportation Needs:   . Film/video editor (Medical):   Marland Kitchen Lack of Transportation (Non-Medical):   Physical Activity:   . Days of Exercise per Week:   . Minutes of Exercise per Session:   Stress:   . Feeling of Stress :   Social Connections:   . Frequency of Communication with Friends and Family:   . Frequency of Social Gatherings with Friends and Family:   . Attends Religious Services:   . Active Member of Clubs or Organizations:   .  Attends Archivist Meetings:   Marland Kitchen Marital Status:   Intimate Partner Violence:   . Fear of Current or Ex-Partner:   . Emotionally Abused:   Marland Kitchen Physically Abused:   . Sexually Abused:    Current Meds  Medication Sig  . ALPRAZolam (XANAX) 0.25 MG tablet Take 1 tablet (0.25 mg total) by mouth at bedtime as needed.  Marland Kitchen CA-MG-VALERIAN-PASSIF-GINGKO PO Take by mouth.  . Cholecalciferol ([facility]S D3) 125 MCG (5000 UT) capsule Take 1 capsule (5,000 Units total) by mouth daily.  . cyanocobalamin 100 MCG tablet Take 100 mcg by mouth daily.  Marland Kitchen escitalopram (LEXAPRO) 10 MG tablet Take 1 tablet (10 mg total) by mouth daily.  . hydrochlorothiazide (HYDRODIURIL) 12.5 MG tablet Take 1 tablet (12.5 mg total) by mouth daily. In am  . levocetirizine (XYZAL) 5 MG tablet Take 1 tablet (5 mg total) by mouth every evening.  . Multiple Vitamin (MULTIVITAMIN) capsule Take by mouth daily.   . Nutritional  Supplements (ESTROVEN PO) Take by mouth.  . pantoprazole (PROTONIX) 40 MG tablet Take 1 tablet (40 mg total) by mouth daily.  . Potassium Chloride ER 20 MEQ TBCR 20 meq daily  . telmisartan (MICARDIS) 20 MG tablet Take 1 tablet (20 mg total) by mouth daily.  . [DISCONTINUED] ALPRAZolam (XANAX) 0.25 MG tablet Take 1 tablet (0.25 mg total) by mouth at bedtime as needed.   Allergies  Allergen Reactions  . Losartan Potassium-Hctz     Other reaction(s): Other (See Comments) Weight gain, constipation  . Tape Rash   Recent Results (from the past 2160 hour(s))  Novel Coronavirus, NAA ([facility])     Status: None   Collection Time: 11/18/19 10:14 AM   Specimen: Nasopharyngeal(NP) swabs in vial transport medium   NASOPHARYNGE  TESTING  Result Value Ref Range   SARS-CoV-2, NAA Not Detected Not Detected    Comment: This nucleic acid amplification test was developed and its performance characteristics determined by [employer]. Nucleic acid amplification tests include PCR and TMA. This test has not been FDA cleared or approved. This test has been authorized by FDA under an Emergency Use Authorization (EUA). This test is only authorized for the duration of time the declaration that circumstances exist justifying the authorization of the emergency use of in vitro diagnostic tests for detection of SARS-CoV-2 virus and/or diagnosis of COVID-19 infection under section 564(b)(1) of the Act, 21 U.S.C. PT:2852782) (1), unless the authorization is terminated or revoked sooner. When diagnostic testing is negative, the possibility of a false negative result should be considered in the context of a patient's recent exposures and the presence of clinical signs and symptoms consistent with COVID-19. An individual without symptoms of COVID-19 and who is not shedding SARS-CoV-2 virus would  expect to have a negative (not detected) result in this assay.    Objective  Body mass index is 35.84  kg/m. Wt Readings from Last 3 Encounters:  02/11/20 208 lb 12.8 oz (94.7 kg)  11/18/19 202 lb (91.6 kg)  09/10/19 202 lb 6.4 oz (91.8 kg)   Temp Readings from Last 3 Encounters:  02/11/20 (!) 96.4 F (35.8 C) (Temporal)  11/17/19 (!) 95.4 F (35.2 C)  09/10/19 (!) 97.2 F (36.2 C) (Skin)   BP Readings from Last 3 Encounters:  02/11/20 122/82  11/17/19 (!) 157/91  09/10/19 126/78   Pulse Readings from Last 3 Encounters:  02/11/20 60  11/17/19 68  09/10/19 62    Physical Exam Vitals and nursing note reviewed.  Constitutional:  Appearance: Normal appearance. She is well-developed and well-groomed. She is obese.  HENT:     Head: Normocephalic and atraumatic.  Eyes:     Conjunctiva/sclera: Conjunctivae normal.     Pupils: Pupils are equal, round, and reactive to light.  Cardiovascular:     Rate and Rhythm: Normal rate and regular rhythm.     Heart sounds: Normal heart sounds. No murmur.  Pulmonary:     Effort: Pulmonary effort is normal.     Breath sounds: Normal breath sounds.  Abdominal:     General: Abdomen is flat. Bowel sounds are normal. There is no distension.  Skin:    General: Skin is warm and dry.  Neurological:     General: No focal deficit present.     Mental Status: She is alert and oriented to person, place, and time. Mental status is at baseline.     Gait: Gait normal.  Psychiatric:        Attention and Perception: Attention and perception normal.        Mood and Affect: Mood and affect normal.        Speech: Speech normal.        Behavior: Behavior normal. Behavior is cooperative.        Thought Content: Thought content normal.        Cognition and Memory: Cognition and memory normal.        Judgment: Judgment normal.     Assessment  Plan  Essential hypertension - Plan: Lipid panel, Comprehensive metabolic panel, CBC with Differential/Platelet, Urinalysis, Routine w reflex microscopic Cont meds   Anxiety - Plan: ALPRAZolam (XANAX) 0.25  MG tablet  Allergic rhinitis, unspecified seasonality, unspecified trigger - Plan: mometasone (NASONEX) 50 MCG/ACT nasal spray  HM Flu shothad 08/11/19 Tdap had 03/30/17 Consider pna 23 but quit 2.5 months since 09/10/19 will hold until age 33 y.o  Consider shingrix 1/2 utd 2nd due 03/10/20 covid 1/2 2nd dose sch 02/22/20  Mammogram neg 7/22/2020negative  Referred ordered again   Pap appears not done saw Dr. Marcelline Mates 04/09/18 per pt had pap per but Dr. Dessie Coma notes no pap s/p hysterectomy no h/o abnormal papand no need further pap  Colonoscopy h/o polyps need to get record from [facility] last colonoscopy in [place] echo and colonoscopy -09/10/19 wants to hold on colonoscopy for now  Pt to call to schedule   Thyroid US no further w/u 09/2019   -of note also need echo report-2nd request Smoker 2 cig/qd stopped 7 or 06/2019 started in 20slight smoker   rec healthy diet and exercise  Provider: Dr. Olivia Mackie McLean-Scocuzza-Internal Medicine

## 2020-02-11 NOTE — Patient Instructions (Addendum)
Nature wise tumeric/ginger/curcumin  Or nature made   voltaren gel use it up to 4x per day  Compression stockings knee highs    Screening colonoscopy   [address], [place], [place] [address]                                                 [place], [place] [address]  [phone]        Joylene Igo [phone]  *Dr.Darren Allen Norris         *Dr. Sherri Sear       *Dr. Jonathon Bellows           *Dr. Vonda Antigua  Colonoscopy Instructions Sheet  Procedure Date: 07/15/19                          Location: [facility]    Arthritis Arthritis is a term that is commonly used to refer to joint pain or joint disease. There are more than 100 types of arthritis. What are the causes? The most common cause of this condition is wear and tear of a joint. Other causes include:  Gout.  Inflammation of a joint.  An infection of a joint.  Sprains and other injuries near the joint.  A reaction to medicines or drugs, or an allergic reaction. In some cases, the cause may not be known. What are the signs or symptoms? The main symptom of this condition is pain in the joint during movement. Other symptoms include:  Redness, swelling, or stiffness at a joint.  Warmth coming from the joint.  Fever.  Overall feeling of illness. How is this diagnosed? This condition may be diagnosed with a physical exam and tests, including:  Blood tests.  Urine tests.  Imaging tests, such as X-rays, an MRI, or a CT scan. Sometimes, fluid is removed from a joint for testing. How is this treated? This condition may be treated with:  Treatment of the cause, if it is known.  Rest.  Raising (elevating) the joint.  Applying cold or hot packs to the joint.  Medicines to improve symptoms and reduce inflammation.  Injections of a steroid such as cortisone into the joint to help reduce pain and inflammation. Depending on the  cause of your arthritis, you may need to make lifestyle changes to reduce stress on your joint. Changes may include:  Exercising more.  Losing weight. Follow these instructions at home: Medicines  Take over-the-counter and prescription medicines only as told by your health care provider.  Do not take aspirin to relieve pain if your health care provider thinks that gout may be causing your pain. Activity  Rest your joint if told by your health care provider. Rest is important when your disease is active and your joint feels painful, swollen, or stiff.  Avoid activities that make the pain worse. It is important to balance activity with rest.  Exercise your joint regularly with range-of-motion exercises as told by your health care provider. Try doing low-impact exercise, such as: ? Swimming. ? Water aerobics. ? Biking. ? Walking. Managing pain, stiffness, and swelling      If directed, put ice on  the joint. ? Put ice in a plastic bag. ? Place a towel between your skin and the bag. ? Leave the ice on for 20 minutes, 2-3 times per day.  If your joint is swollen, raise (elevate) it above the level of your heart if directed by your health care provider.  If your joint feels stiff in the morning, try taking a warm shower.  If directed, apply heat to the affected area as often as told by your health care provider. Use the heat source that your health care provider recommends, such as a moist heat pack or a heating pad. If you have diabetes, do not apply heat without permission from your health care provider. To apply heat: ? Place a towel between your skin and the heat source. ? Leave the heat on for 20-30 minutes. ? Remove the heat if your skin turns bright red. This is especially important if you are unable to feel pain, heat, or cold. You may have a greater risk of getting burned. General instructions  Do not use any products that contain nicotine or tobacco, such as cigarettes,  e-cigarettes, and chewing tobacco. If you need help quitting, ask your health care provider.  Keep all follow-up visits as told by your health care provider. This is important. Contact a health care provider if:  The pain gets worse.  You have a fever. Get help right away if:  You develop severe joint pain, swelling, or redness.  Many joints become painful and swollen.  You develop severe back pain.  You develop severe weakness in your leg.  You cannot control your bladder or bowels. Summary  Arthritis is a term that is commonly used to refer to joint pain or joint disease. There are more than 100 types of arthritis.  The most common cause of this condition is wear and tear of a joint. Other causes include gout, inflammation or infection of the joint, sprains, or allergies.  Symptoms of this condition include redness, swelling, or stiffness of the joint. Other symptoms include warmth, fever, or feeling ill.  This condition is treated with rest, elevation, medicines, and applying cold or hot packs.  Follow your health care provider's instructions about medicines, activity, exercises, and other home care treatments. This information is not intended to replace advice given to you by your health care provider. Make sure you discuss any questions you have with your health care provider. Document Revised: 10/07/2018 Document Reviewed: 10/07/2018 Elsevier Patient Education  [employer].  Costochondritis  Costochondritis is swelling and irritation (inflammation) of the tissue (cartilage) that connects your ribs to your breastbone (sternum). This causes pain in the front of your chest. The pain usually starts gradually and involves more than one rib. What are the causes? The exact cause of this condition is not always known. It results from stress on the cartilage where your ribs attach to your sternum. The cause of this stress could be:  Chest injury (trauma).  Exercise or  activity, such as lifting.  Severe coughing. What increases the risk? You may be at higher risk for this condition if you:  Are female.  Are 78?54 years old.  Recently started a new exercise or work activity.  Have low levels of vitamin D.  Have a condition that makes you cough frequently. What are the signs or symptoms? The main symptom of this condition is chest pain. The pain:  Usually starts gradually and can be sharp or dull.  Gets worse with deep breathing,  coughing, or exercise.  Gets better with rest.  May be worse when you press on the sternum-rib connection (tenderness). How is this diagnosed? This condition is diagnosed based on your symptoms, medical history, and a physical exam. Your health care provider will check for tenderness when pressing on your sternum. This is the most important finding. You may also have tests to rule out other causes of chest pain. These may include:  A chest X-ray to check for lung problems.  An electrocardiogram (ECG) to see if you have a heart problem that could be causing the pain.  An imaging scan to rule out a chest or rib fracture. How is this treated? This condition usually goes away on its own over time. Your health care provider may prescribe an NSAID to reduce pain and inflammation. Your health care provider may also suggest that you:  Rest and avoid activities that make pain worse.  Apply heat or cold to the area to reduce pain and inflammation.  Do exercises to stretch your chest muscles. If these treatments do not help, your health care provider may inject a numbing medicine at the sternum-rib connection to help relieve the pain. Follow these instructions at home:  Avoid activities that make pain worse. This includes any activities that use chest, abdominal, and side muscles.  If directed, put ice on the painful area: ? Put ice in a plastic bag. ? Place a towel between your skin and the bag. ? Leave the ice on for  20 minutes, 2-3 times a day.  If directed, apply heat to the affected area as often as told by your health care provider. Use the heat source that your health care provider recommends, such as a moist heat pack or a heating pad. ? Place a towel between your skin and the heat source. ? Leave the heat on for 20-30 minutes. ? Remove the heat if your skin turns bright red. This is especially important if you are unable to feel pain, heat, or cold. You may have a greater risk of getting burned.  Take over-the-counter and prescription medicines only as told by your health care provider.  Return to your normal activities as told by your health care provider. Ask your health care provider what activities are safe for you.  Keep all follow-up visits as told by your health care provider. This is important. Contact a health care provider if:  You have chills or a fever.  Your pain does not go away or it gets worse.  You have a cough that does not go away (is persistent). Get help right away if:  You have shortness of breath. This information is not intended to replace advice given to you by your health care provider. Make sure you discuss any questions you have with your health care provider. Document Revised: 11/14/2017 Document Reviewed: 02/23/2016 Elsevier Patient Education  2020 [employer].

## 2020-02-12 LAB — URINALYSIS, ROUTINE W REFLEX MICROSCOPIC
Bilirubin Urine: NEGATIVE
Glucose, UA: NEGATIVE
Hgb urine dipstick: NEGATIVE
Hyaline Cast: NONE SEEN /LPF
Ketones, ur: NEGATIVE
Leukocytes,Ua: NEGATIVE
Nitrite: NEGATIVE
RBC / HPF: NONE SEEN /HPF (ref 0–2)
Specific Gravity, Urine: 1.026 (ref 1.001–1.03)
WBC, UA: NONE SEEN /HPF (ref 0–5)
pH: >=8.5 — AB (ref 5.0–8.0)

## 2020-02-24 ENCOUNTER — Encounter: Payer: Self-pay | Admitting: Internal Medicine

## 2020-02-24 NOTE — Progress Notes (Signed)
Xanax printed RX faxed to [phone]. Fax confirmation has been received .

## 2020-03-09 ENCOUNTER — Ambulatory Visit (INDEPENDENT_AMBULATORY_CARE_PROVIDER_SITE_OTHER): Payer: BC Managed Care – PPO

## 2020-03-09 ENCOUNTER — Other Ambulatory Visit: Payer: Self-pay

## 2020-03-09 DIAGNOSIS — Z23 Encounter for immunization: Secondary | ICD-10-CM

## 2020-03-09 NOTE — Progress Notes (Signed)
Patient presented for Shingrix injection to left deltoid, patient voiced no concerns nor showed any signs of distress during injection.

## 2020-03-24 ENCOUNTER — Ambulatory Visit: Payer: BC Managed Care – PPO

## 2020-04-09 ENCOUNTER — Encounter: Payer: Self-pay | Admitting: Internal Medicine

## 2020-05-03 ENCOUNTER — Telehealth: Payer: Self-pay | Admitting: Obstetrics and Gynecology

## 2020-05-03 DIAGNOSIS — Z1211 Encounter for screening for malignant neoplasm of colon: Secondary | ICD-10-CM

## 2020-05-03 DIAGNOSIS — Z86010 Personal history of colonic polyps: Secondary | ICD-10-CM

## 2020-05-03 NOTE — Telephone Encounter (Signed)
Patient called in saying the referral that was sent in for gastro has expired and was wanting her provider to put in a new one. Could you please advise.

## 2020-05-03 NOTE — Telephone Encounter (Signed)
Pt called no answer LM via VM that I had reviewed her chart and noticed that [facility] had a referral sent to [facility] in Aug 2020 for a colon cancer screening and all appointments were completed for the reason for the referral. Pt was advised to call the office with more information concerning the referral that had expired. Number to office was left on VM.

## 2020-05-03 NOTE — Telephone Encounter (Signed)
Spoke to pt concerning her referral. Pt stated that she scheduled the appointment and had to cancer. Pt then stated that covid happen and she never scheduled another appointment and would like to have the procedure done now. Pt is aware that a referral was placed to a Gastro for colon cancer screening.

## 2020-05-03 NOTE — Addendum Note (Signed)
Addended by: Edwyna Shell on: 05/03/2020 11:51 AM   Modules accepted: Orders

## 2020-05-05 IMAGING — US US THYROID
1 series · 13 of 25 positions shown · non-contrast
Comparison: None.

CLINICAL DATA: Thyroid nodule

EXAM:
THYROID ULTRASOUND
TECHNIQUE: Ultrasound examination of the thyroid gland and adjacent soft
tissues was performed.

[Series 1: us thyroid · 0.07mm/px · 13 of 48 slices shown]
[im 1/48]
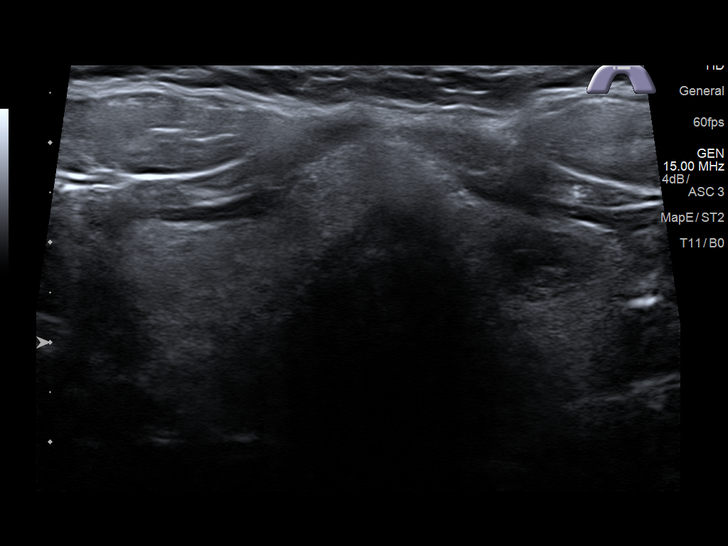
[im 4/48]
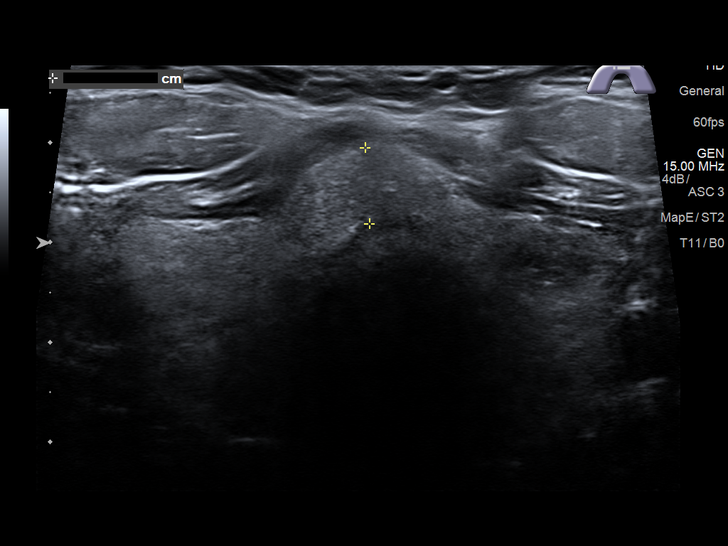
[im 8/48]
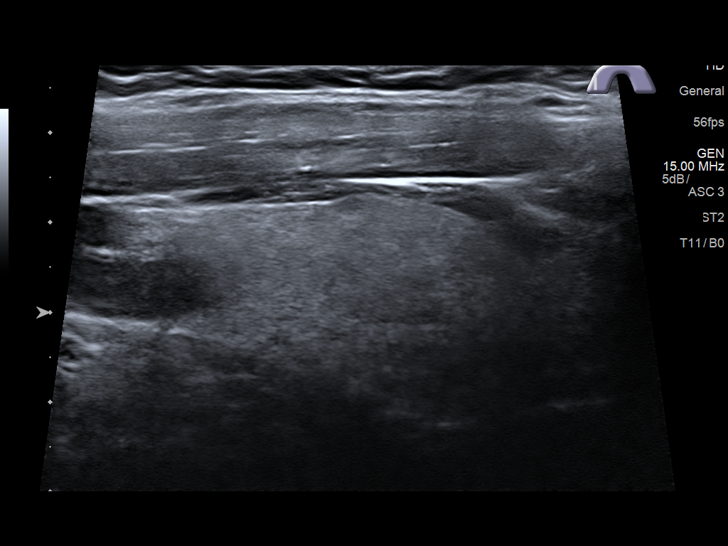
[im 12/48]
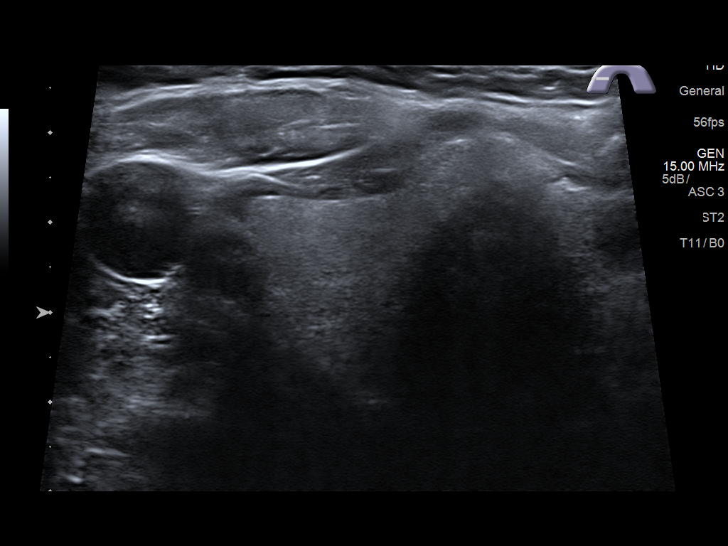
[im 16/48]
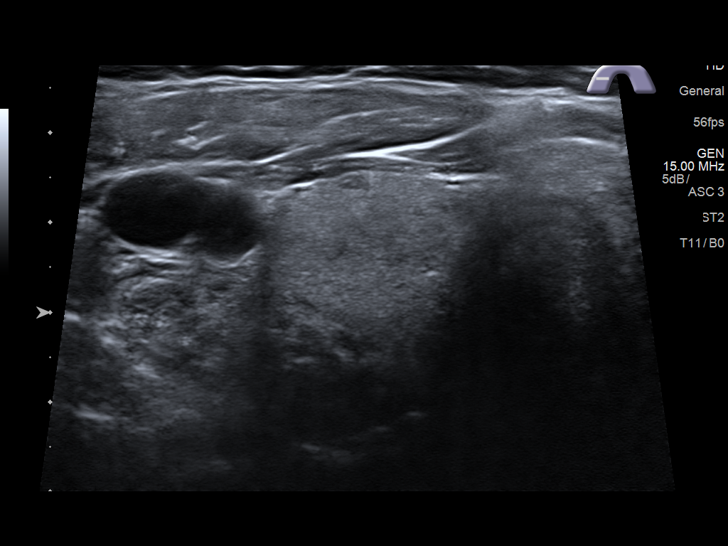
[im 20/48]
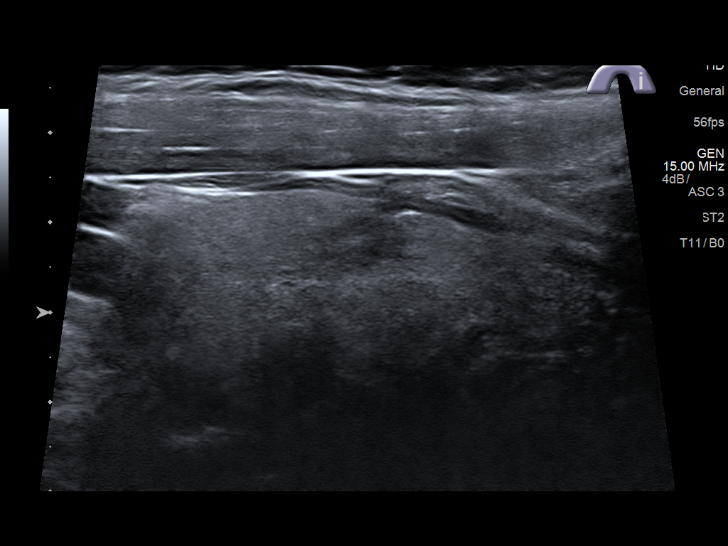
[im 24/48]
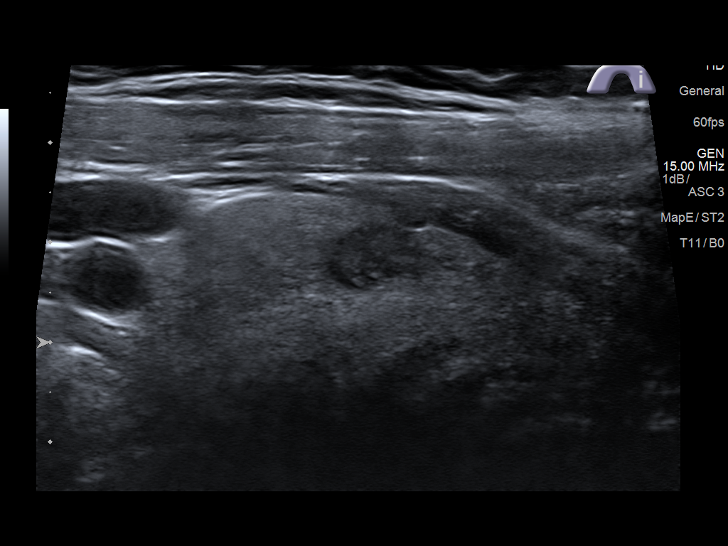
[im 28/48]
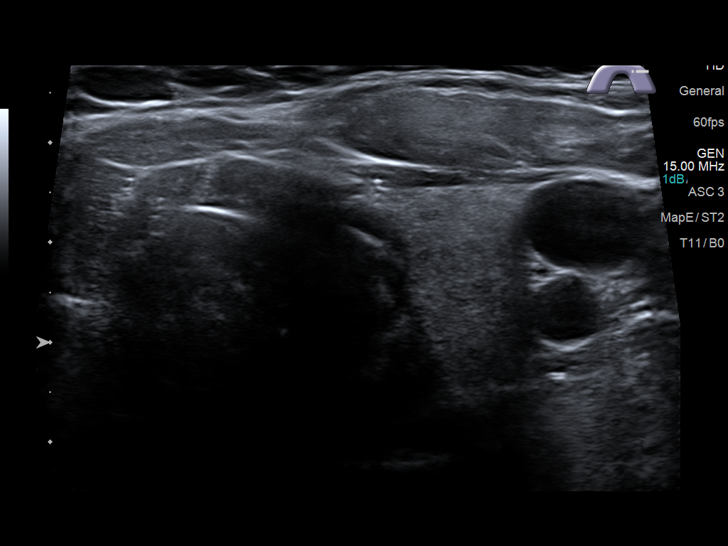
[im 32/48]
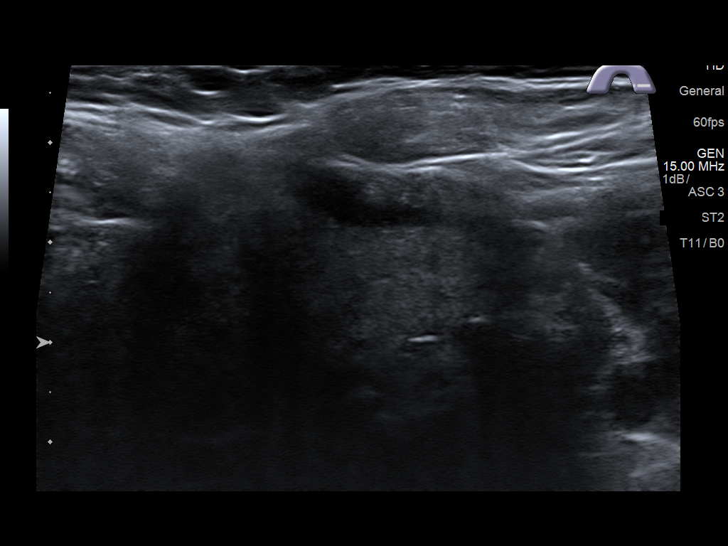
[im 36/48]
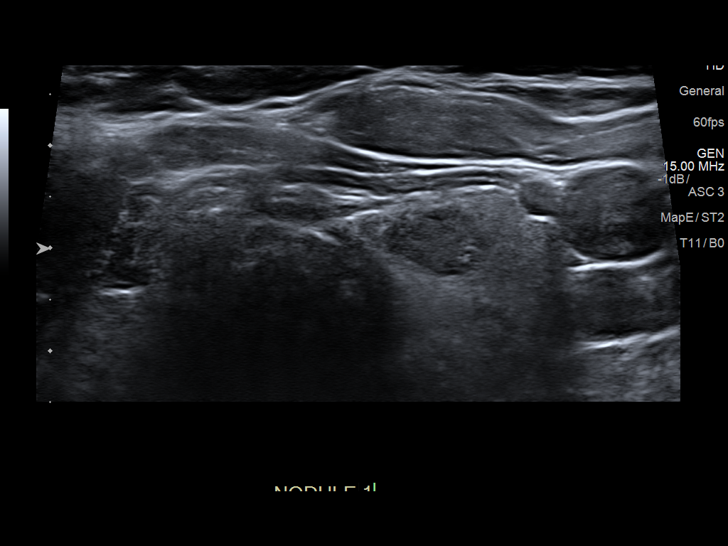
[im 40/48]
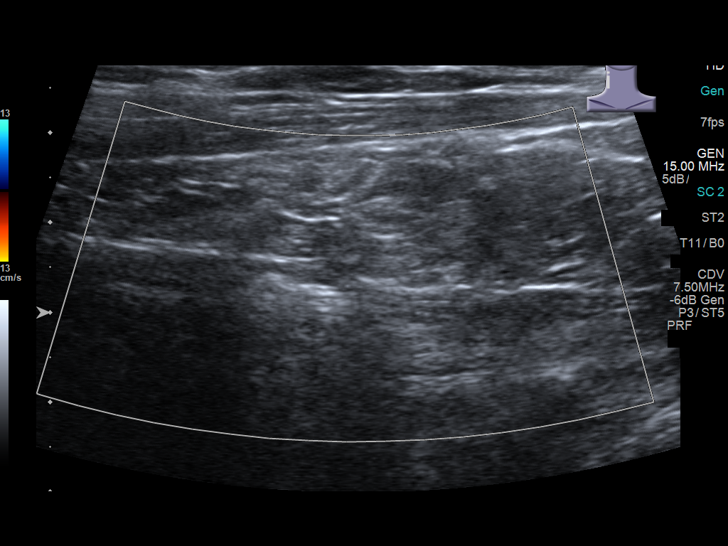
[im 44/48]
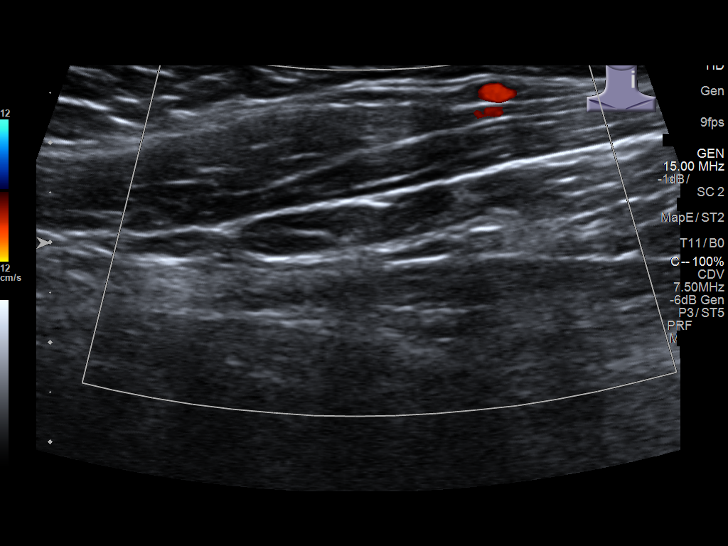
[im 48/48]
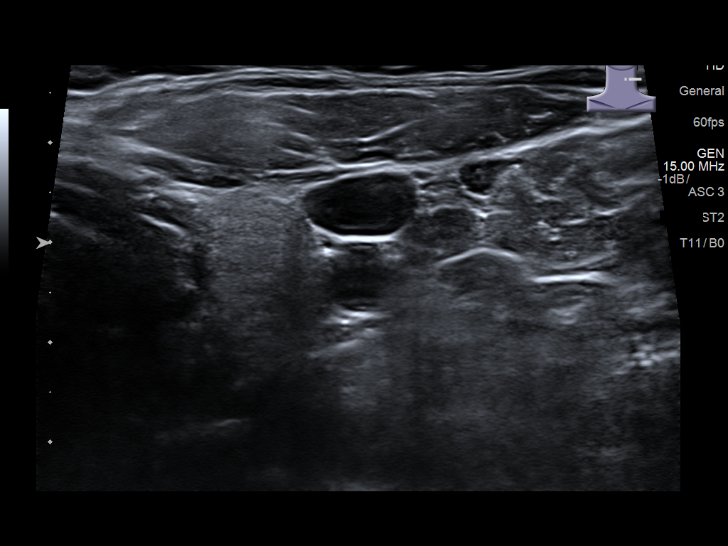

[13 of 25 positions shown; findings below may reference images not displayed]

FINDINGS: Parenchymal Echotexture: Mildly heterogenous

Isthmus: 8 mm

Right lobe: 5.6 x 2.0 x 2.0 cm

Left lobe: 5.1 x 2.0 x 1.9 cm

_________________________________________________________

Estimated total number of nodules >/= 1 cm: 1

Number of spongiform nodules >/=  2 cm not described below (TR1): 0

Number of mixed cystic and solid nodules >/= 1.5 cm not described
below (TR2): 0

_________________________________________________________

Nodule # 1:

Location: Left; Mid

Maximum size: 1.1 cm; Other 2 dimensions: 1.1 x 0.7 cm

Composition: solid/almost completely solid (2)

Echogenicity: hypoechoic (2)

Shape: not taller-than-wide (0)

Margins: ill-defined (0)

Echogenic foci: none (0)

ACR TI-RADS total points: 4.

ACR TI-RADS risk category: TR4 (4-6 points).

ACR TI-RADS recommendations:

*Given size (>/= 1 - 1.4 cm) and appearance, a follow-up ultrasound
in 1 year should be considered based on TI-RADS criteria.

_________________________________________________________

Subcentimeter hypoechoic right thyroid nodule measures 4 mm. This
would not meet criteria for any biopsy or follow-up.

No adenopathy.
IMPRESSION: 1.1 cm left mid thyroid TR 4 nodule meets criteria follow-up in 1
year. No other significant thyroid abnormality.

The above is in keeping with the ACR TI-RADS recommendations - [HOSPITAL] 3464;[DATE].

## 2020-05-06 ENCOUNTER — Other Ambulatory Visit: Payer: Self-pay

## 2020-05-06 ENCOUNTER — Telehealth (INDEPENDENT_AMBULATORY_CARE_PROVIDER_SITE_OTHER): Payer: Self-pay | Admitting: Gastroenterology

## 2020-05-06 DIAGNOSIS — Z1211 Encounter for screening for malignant neoplasm of colon: Secondary | ICD-10-CM

## 2020-05-06 NOTE — Progress Notes (Signed)
Gastroenterology Pre-Procedure Review  Request Date: 05/18/20 Requesting Physician: Dr. Vicente Males  Note: Patient preferred to prep for her colonoscopy by taking two bottles of citric magnesium.  I advised her that this was not recommended prep however would document that this is her preference.  PATIENT REVIEW QUESTIONS: The patient responded to the following health history questions as indicated:    1. Are you having any GI issues? no 2. Do you have a personal history of Polyps? no 3. Do you have a family history of Colon Cancer or Polyps? no 4. Diabetes Mellitus? no 5. Joint replacements in the past 12 months?no 6. Major health problems in the past 3 months?no 7. Any artificial heart valves, MVP, or defibrillator?no    MEDICATIONS & ALLERGIES:    Patient reports the following regarding taking any anticoagulation/antiplatelet therapy:   Plavix, Coumadin, Eliquis, Xarelto, Lovenox, Pradaxa, Brilinta, or Effient? no Aspirin? no  Patient confirms/reports the following medications:  Current Outpatient Medications  Medication Sig Dispense Refill  . ALPRAZolam (XANAX) 0.25 MG tablet Take 1 tablet (0.25 mg total) by mouth at bedtime as needed. 30 tablet 5  . CA-MG-VALERIAN-PASSIF-GINGKO PO Take by mouth.    . Cholecalciferol ([employer]S D3) 125 MCG (5000 UT) capsule Take 1 capsule (5,000 Units total) by mouth daily. 90 capsule 3  . cyanocobalamin 100 MCG tablet Take 100 mcg by mouth daily.    Marland Kitchen escitalopram (LEXAPRO) 10 MG tablet Take 1 tablet (10 mg total) by mouth daily. 90 tablet 3  . hydrochlorothiazide (HYDRODIURIL) 12.5 MG tablet Take 1 tablet (12.5 mg total) by mouth daily. In am 90 tablet 3  . levocetirizine (XYZAL) 5 MG tablet Take 1 tablet (5 mg total) by mouth every evening. 90 tablet 3  . mometasone (NASONEX) 50 MCG/ACT nasal spray Place 2 sprays into the nose daily. 1x 51 g 11  . Multiple Vitamin (MULTIVITAMIN) capsule Take by mouth daily.     . Nutritional Supplements (ESTROVEN PO) Take  by mouth.    . pantoprazole (PROTONIX) 40 MG tablet Take 1 tablet (40 mg total) by mouth daily. 90 tablet 3  . Potassium Chloride ER 20 MEQ TBCR 20 meq daily 90 tablet 3  . telmisartan (MICARDIS) 20 MG tablet Take 1 tablet (20 mg total) by mouth daily. 90 tablet 3   No current facility-administered medications for this visit.    Patient confirms/reports the following allergies:  Allergies  Allergen Reactions  . Losartan Potassium-Hctz     Other reaction(s): Other (See Comments) Weight gain, constipation  . Tape Rash    No orders of the defined types were placed in this encounter.   AUTHORIZATION INFORMATION Primary Insurance: 1D#: Group #:  Secondary Insurance: 1D#: Group #:  SCHEDULE INFORMATION: Date: 05/18/20 Time: Location:[facility]

## 2020-05-14 ENCOUNTER — Other Ambulatory Visit: Payer: Self-pay

## 2020-05-14 ENCOUNTER — Other Ambulatory Visit
Admission: RE | Admit: 2020-05-14 | Discharge: 2020-05-14 | Disposition: A | Discharge status: Home / Self Care | Payer: BC Managed Care – PPO | Source: Ambulatory Visit | Attending: Gastroenterology | Admitting: Gastroenterology

## 2020-05-14 ENCOUNTER — Encounter: Payer: Self-pay | Admitting: Gastroenterology

## 2020-05-14 DIAGNOSIS — Z20822 Contact with and (suspected) exposure to covid-19: Secondary | ICD-10-CM | POA: Diagnosis not present

## 2020-05-14 DIAGNOSIS — Z01812 Encounter for preprocedural laboratory examination: Secondary | ICD-10-CM | POA: Diagnosis not present

## 2020-05-14 LAB — SARS CORONAVIRUS 2 (TAT 6-24 HRS): SARS Coronavirus 2: NEGATIVE

## 2020-05-18 ENCOUNTER — Encounter
Admission: RE | Disposition: A | Discharge status: Home / Self Care | Payer: Self-pay | Source: Home / Self Care | Attending: Gastroenterology

## 2020-05-18 ENCOUNTER — Ambulatory Visit: Payer: BC Managed Care – PPO | Admitting: Certified Registered Nurse Anesthetist

## 2020-05-18 ENCOUNTER — Other Ambulatory Visit: Payer: Self-pay

## 2020-05-18 ENCOUNTER — Encounter: Payer: Self-pay | Admitting: Gastroenterology

## 2020-05-18 ENCOUNTER — Ambulatory Visit
Admission: RE | Admit: 2020-05-18 | Discharge: 2020-05-18 | Disposition: A | Discharge status: Home / Self Care | Payer: BC Managed Care – PPO | Attending: Gastroenterology | Admitting: Gastroenterology

## 2020-05-18 DIAGNOSIS — K219 Gastro-esophageal reflux disease without esophagitis: Secondary | ICD-10-CM | POA: Insufficient documentation

## 2020-05-18 DIAGNOSIS — Z87891 Personal history of nicotine dependence: Secondary | ICD-10-CM | POA: Diagnosis not present

## 2020-05-18 DIAGNOSIS — Z79899 Other long term (current) drug therapy: Secondary | ICD-10-CM | POA: Insufficient documentation

## 2020-05-18 DIAGNOSIS — Z1211 Encounter for screening for malignant neoplasm of colon: Secondary | ICD-10-CM | POA: Diagnosis not present

## 2020-05-18 DIAGNOSIS — I10 Essential (primary) hypertension: Secondary | ICD-10-CM | POA: Diagnosis not present

## 2020-05-18 DIAGNOSIS — F419 Anxiety disorder, unspecified: Secondary | ICD-10-CM | POA: Insufficient documentation

## 2020-05-18 HISTORY — PX: COLONOSCOPY WITH PROPOFOL: SHX5780

## 2020-05-18 SURGERY — COLONOSCOPY WITH PROPOFOL
Anesthesia: General

## 2020-05-18 MED ORDER — SODIUM CHLORIDE 0.9 % IV SOLN: INTRAVENOUS | Status: DC

## 2020-05-18 MED ORDER — SUTAB 1479-225-188 MG PO TABS: 1.0000 | ORAL_TABLET | Freq: Once | ORAL | 0 refills | Status: AC

## 2020-05-18 MED ORDER — LIDOCAINE HCL (CARDIAC) PF 100 MG/5ML IV SOSY
PREFILLED_SYRINGE | INTRAVENOUS | Status: DC | PRN
  Administered 2020-05-18: 100 mg via INTRAVENOUS

## 2020-05-18 MED ORDER — PROPOFOL 10 MG/ML IV BOLUS
INTRAVENOUS | Status: DC | PRN
  Administered 2020-05-18: 90 mg via INTRAVENOUS

## 2020-05-18 NOTE — Anesthesia Postprocedure Evaluation (Signed)
Anesthesia Post Note  Patient: Kelly Pittman  Procedure(s) Performed: COLONOSCOPY WITH PROPOFOL (N/A )  Patient location during evaluation: Endoscopy Anesthesia Type: General Level of consciousness: awake and alert Pain management: pain level controlled Vital Signs Assessment: post-procedure vital signs reviewed and stable Respiratory status: spontaneous breathing, nonlabored ventilation, respiratory function stable and patient connected to nasal cannula oxygen Cardiovascular status: blood pressure returned to baseline and stable Postop Assessment: no apparent nausea or vomiting Anesthetic complications: no   No complications documented.   Last Vitals:  Vitals:   05/18/20 0933 05/18/20 0943  BP: 131/82 131/74  Pulse: (!) 50 (!) 50  Resp: 15 11  Temp:    SpO2: 100% 100%    Last Pain:  Vitals:   05/18/20 0943  TempSrc:   PainSc: 0-No pain                 Martha Clan

## 2020-05-18 NOTE — Transfer of Care (Signed)
Immediate Anesthesia Transfer of Care Note  Patient: Isla Arnee Ribaudo  Procedure(s) Performed: COLONOSCOPY WITH PROPOFOL (N/A )  Patient Location: PACU and Endoscopy Unit  Anesthesia Type:General  Level of Consciousness: awake, oriented and patient cooperative  Airway & Oxygen Therapy: Patient Spontanous Breathing  Post-op Assessment: Report given to RN, Post -op Vital signs reviewed and stable and Patient moving all extremities  Post vital signs: Reviewed and stable  Last Vitals:  Vitals Value Taken Time  BP 115/73 05/18/20 0923  Temp 36.2 C 05/18/20 0923  Pulse 54 05/18/20 0926  Resp 19 05/18/20 0926  SpO2 100 % 05/18/20 0926  Vitals shown include unvalidated device data.  Last Pain:  Vitals:   05/18/20 0923  TempSrc: Temporal  PainSc: 0-No pain         Complications: No complications documented.

## 2020-05-18 NOTE — Anesthesia Preprocedure Evaluation (Signed)
Anesthesia Evaluation  Patient identified by MRN, date of birth, ID band Patient awake    Reviewed: Allergy & Precautions, H&P , NPO status , Patient's Chart, lab work & pertinent test results, reviewed documented beta blocker date and time   History of Anesthesia Complications (+) PONV and history of anesthetic complications  Airway Mallampati: II  TM Distance: >3 FB Neck ROM: full    Dental  (+) Dental Advidsory Given, Teeth Intact   Pulmonary neg pulmonary ROS, former smoker,    Pulmonary exam normal breath sounds clear to auscultation       Cardiovascular Exercise Tolerance: Good hypertension, (-) angina(-) Past MI and (-) Cardiac Stents Normal cardiovascular exam(-) dysrhythmias (-) Valvular Problems/Murmurs Rhythm:regular Rate:Normal     Neuro/Psych PSYCHIATRIC DISORDERS Anxiety negative neurological ROS     GI/Hepatic negative GI ROS, Neg liver ROS, GERD  ,  Endo/Other  negative endocrine ROS  Renal/GU negative Renal ROS  negative genitourinary   Musculoskeletal   Abdominal   Peds  Hematology negative hematology ROS (+)   Anesthesia Other Findings Past Medical History: No date: Abnormal thyroid function test No date: Anxiety 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 2014: Colon polyp No date: Enlarged thyroid No date: Hypertension No date: UTI (urinary tract infection)   Reproductive/Obstetrics negative OB ROS                             Anesthesia Physical Anesthesia Plan  ASA: II  Anesthesia Plan: General   Post-op Pain Management:    Induction: Intravenous  PONV Risk Score and Plan: 4 or greater and Propofol infusion and TIVA  Airway Management Planned: Natural Airway and Nasal Cannula  Additional Equipment:   Intra-op Plan:   Post-operative Plan:   Informed Consent: I have reviewed the patients History and Physical,  chart, labs and discussed the procedure including the risks, benefits and alternatives for the proposed anesthesia with the patient or authorized representative who has indicated his/her understanding and acceptance.     Dental Advisory Given  Plan Discussed with: Anesthesiologist, CRNA and Surgeon  Anesthesia Plan Comments:         Anesthesia Quick Evaluation

## 2020-05-18 NOTE — H&P (Signed)
Kelly Bellows, MD [address], [place], [place], [place], [address] [address], [place], [place], [place], [address] Phone: [phone]  Fax: [phone]  Primary Care Physician:  McLean-Scocuzza, Nino Glow, MD   Pre-Procedure History & Physical: HPI:  Kelly Pittman is a 54 y.o. female is here for an colonoscopy.   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 Authorizing Provider  ALPRAZolam (XANAX) 0.25 MG tablet Take 1 tablet (0.25 mg total) by mouth at bedtime as needed. 02/11/20  Yes McLean-Scocuzza, Nino Glow, MD  CA-MG-VALERIAN-PASSIF-GINGKO PO Take by mouth.   Yes [provider]  Cholecalciferol ([employer] D3) 125 MCG (5000 UT) capsule Take 1 capsule (5,000 Units total) by mouth daily. 02/20/19  Yes McLean-Scocuzza, Nino Glow, MD  cyanocobalamin 100 MCG tablet Take 100 mcg by mouth daily.   Yes [provider]  escitalopram (LEXAPRO) 10 MG tablet Take 1 tablet (10 mg total) by mouth daily. 09/10/19  Yes McLean-Scocuzza, Nino Glow, MD  hydrochlorothiazide (HYDRODIURIL) 12.5 MG tablet Take 1 tablet (12.5 mg total) by mouth daily. In am 09/10/19  Yes McLean-Scocuzza, Nino Glow, MD  levocetirizine (XYZAL) 5 MG tablet Take 1 tablet (5 mg total) by mouth every evening. 09/10/19  Yes McLean-Scocuzza, Nino Glow, MD  mometasone (NASONEX) 50 MCG/ACT nasal spray Place 2 sprays into the nose daily. 1x 02/11/20  Yes McLean-Scocuzza, Nino Glow, MD  Multiple Vitamin  (MULTIVITAMIN) capsule Take by mouth daily.    Yes [provider]  Nutritional Supplements (ESTROVEN PO) Take by mouth.   Yes [provider]  pantoprazole (PROTONIX) 40 MG tablet Take 1 tablet (40 mg total) by mouth daily. 09/10/19  Yes McLean-Scocuzza, Nino Glow, MD  Potassium Chloride ER 20 MEQ TBCR 20 meq daily 09/10/19  Yes McLean-Scocuzza, Nino Glow, MD  telmisartan (MICARDIS) 20 MG tablet Take 1 tablet (20 mg total) by mouth daily. 09/10/19  Yes McLean-Scocuzza, Nino Glow, MD    Allergies as of 05/07/2020 - Review Complete 05/06/2020  Allergen Reaction Noted   Losartan potassium-hctz  06/07/2017   Tape Rash 03/23/2016    Family History  Problem Relation Age of Onset   Hypertension Mother    Hyperlipidemia Mother    Hypertension Father    Breast cancer Neg Hx    Ovarian cancer Neg Hx    Stroke Neg Hx    Thyroid disease Neg Hx     Social History   Socioeconomic History   Marital status: Married    Spouse name: Not on file   Number of children: Not on file   Years of education: Not on file   Highest education level: Not on file  Occupational History   Not on file  Tobacco Use   Smoking status: Former Smoker    Packs/day: 0.25    Quit date:  2020    Years since quitting: 1.5   Smokeless tobacco: Never Used  Vaping Use   Vaping Use: Never used  Substance and Sexual Activity   Alcohol use: Yes    Comment: socially   Drug use: No   Sexual activity: Yes    Birth control/protection: Surgical  Other Topics Concern   Not on file  Social History Narrative   Married    Adopted 88 y.o daughter as of 07/22/18    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   No guns    Wears seat belt    Safe in relationship    Smokes 2 cig per day started in 82s    Social drinker    Social Determinants of Radio broadcast assistant Strain:    Difficulty of Paying Living Expenses:   Food Insecurity:    Worried  About Charity fundraiser in the Last Year:    Arboriculturist in the Last Year:   Transportation Needs:    Film/video editor (Medical):    Lack of Transportation (Non-Medical):   Physical Activity:    Days of Exercise per Week:    Minutes of Exercise per Session:   Stress:    Feeling of Stress :   Social Connections:    Frequency of Communication with Friends and Family:    Frequency of Social Gatherings with Friends and Family:    Attends Religious Services:    Active Member of Clubs or Organizations:    Attends Music therapist:    Marital Status:   Intimate Partner Violence:    Fear of Current or Ex-Partner:    Emotionally Abused:    Physically Abused:    Sexually Abused:     Review of Systems: See HPI, otherwise negative ROS  Physical Exam: BP 137/62    Pulse 62    Temp (!) 96.9 F (36.1 C)    Resp 16    Ht 5\' 4"  (1.626 m)    Wt 92.1 kg    SpO2 100%    BMI 34.84 kg/m  General:   Alert,  pleasant and cooperative in NAD Head:  Normocephalic and atraumatic. Neck:  Supple; no masses or thyromegaly. Lungs:  Clear throughout to auscultation, normal respiratory effort.    Heart:  +S1, +S2, Regular rate and rhythm, No edema. Abdomen:  Soft, nontender and nondistended. Normal bowel sounds, without guarding, and without rebound.   Neurologic:  Alert and  oriented x4;  grossly normal neurologically.  Impression/Plan: [name] is here for an colonoscopy to be performed for Screening colonoscopy average risk   Risks, benefits, limitations, and alternatives regarding  colonoscopy have been reviewed with the patient.  Questions have been answered.  All parties agreeable.   Kelly Bellows, MD  05/18/2020, 9:09 AM

## 2020-05-18 NOTE — Op Note (Signed)
[facility] Gastroenterology Patient Name: Kelly Pittman Procedure Date: 05/18/2020 9:11 AM MRN: [id number] Account #: [id number] Date of Birth: May 31, 1966 Admit Type: Outpatient Age: 54 Room: [facility] ENDO ROOM 1 Gender: Female Note Status: Finalized Procedure:             Colonoscopy Indications:           Screening for colorectal malignant neoplasm Providers:             Jonathon Bellows MD, MD Referring MD:          Nino Glow Mclean-Scocuzza MD, MD (Referring MD) Medicines:             Monitored Anesthesia Care Complications:         No immediate complications. Procedure:             Pre-Anesthesia Assessment:                        - Prior to the procedure, a History and Physical was                         performed, and patient medications, allergies and                         sensitivities were reviewed. The patient's tolerance                         of previous anesthesia was reviewed.                        - The risks and benefits of the procedure and the                         sedation options and risks were discussed with the                         patient. All questions were answered and informed                         consent was obtained.                        - ASA Grade Assessment: II - A patient with mild                         systemic disease.                        After obtaining informed consent, the colonoscope was                         passed under direct vision. Throughout the procedure,                         the patient's blood pressure, pulse, and oxygen                         saturations were monitored continuously. The                         Colonoscope was introduced through the  anus with the                         intention of advancing to the cecum. The scope was                         advanced to the sigmoid colon before the procedure was                         aborted. Medications were given. The colonoscopy was                          performed with ease. The patient tolerated the                         procedure well. The quality of the bowel preparation                         was poor. Findings:      The perianal and digital rectal examinations were normal.      A large amount of semi-liquid stool was found in the rectum and in the       sigmoid colon. Impression:            - Preparation of the colon was poor.                        - Stool in the rectum and in the sigmoid colon.                        - No specimens collected. Recommendation:        - Discharge patient to home (with escort).                        - Resume previous diet.                        - Continue present medications.                        - Repeat colonoscopy in 2 weeks because the bowel                         preparation was suboptimal. Procedure Code(s):     --- Professional ---                        [phone], 53, Colonoscopy, flexible; diagnostic,                         including collection of specimen(s) by brushing or                         washing, when performed (separate procedure) Diagnosis Code(s):     --- Professional ---                        Z12.11, Encounter for screening for malignant neoplasm                         of colon  CPT copyright 2019 American Medical Association. All rights reserved. The codes documented in this report are preliminary and upon coder review may  be revised to meet current compliance requirements. Jonathon Bellows, MD Jonathon Bellows MD, MD 05/18/2020 9:18:57 AM This report has been signed electronically. Number of Addenda: 0 Note Initiated On: 05/18/2020 9:11 AM Total Procedure Duration: 0 hours 0 minutes 50 seconds  Estimated Blood Loss:  Estimated blood loss: none.      [facility]

## 2020-05-19 ENCOUNTER — Ambulatory Visit
Admission: RE | Admit: 2020-05-19 | Discharge: 2020-05-19 | Disposition: A | Discharge status: Home / Self Care | Payer: BC Managed Care – PPO | Attending: Gastroenterology | Admitting: Gastroenterology

## 2020-05-19 ENCOUNTER — Encounter: Payer: Self-pay | Admitting: Gastroenterology

## 2020-05-19 ENCOUNTER — Ambulatory Visit: Payer: BC Managed Care – PPO | Admitting: Anesthesiology

## 2020-05-19 ENCOUNTER — Encounter
Admission: RE | Disposition: A | Discharge status: Home / Self Care | Payer: Self-pay | Source: Home / Self Care | Attending: Gastroenterology

## 2020-05-19 DIAGNOSIS — Z8349 Family history of other endocrine, nutritional and metabolic diseases: Secondary | ICD-10-CM | POA: Diagnosis not present

## 2020-05-19 DIAGNOSIS — Z1211 Encounter for screening for malignant neoplasm of colon: Secondary | ICD-10-CM

## 2020-05-19 DIAGNOSIS — Z79899 Other long term (current) drug therapy: Secondary | ICD-10-CM | POA: Diagnosis not present

## 2020-05-19 DIAGNOSIS — Z91048 Other nonmedicinal substance allergy status: Secondary | ICD-10-CM | POA: Insufficient documentation

## 2020-05-19 DIAGNOSIS — Z8249 Family history of ischemic heart disease and other diseases of the circulatory system: Secondary | ICD-10-CM | POA: Insufficient documentation

## 2020-05-19 DIAGNOSIS — Z888 Allergy status to other drugs, medicaments and biological substances status: Secondary | ICD-10-CM | POA: Insufficient documentation

## 2020-05-19 DIAGNOSIS — Z87891 Personal history of nicotine dependence: Secondary | ICD-10-CM | POA: Diagnosis not present

## 2020-05-19 DIAGNOSIS — Z86010 Personal history of colonic polyps: Secondary | ICD-10-CM | POA: Diagnosis not present

## 2020-05-19 DIAGNOSIS — K219 Gastro-esophageal reflux disease without esophagitis: Secondary | ICD-10-CM | POA: Diagnosis not present

## 2020-05-19 DIAGNOSIS — F419 Anxiety disorder, unspecified: Secondary | ICD-10-CM | POA: Diagnosis not present

## 2020-05-19 DIAGNOSIS — D124 Benign neoplasm of descending colon: Secondary | ICD-10-CM | POA: Diagnosis not present

## 2020-05-19 DIAGNOSIS — Q959 Balanced rearrangement and structural marker, unspecified: Secondary | ICD-10-CM | POA: Diagnosis not present

## 2020-05-19 DIAGNOSIS — I10 Essential (primary) hypertension: Secondary | ICD-10-CM | POA: Insufficient documentation

## 2020-05-19 DIAGNOSIS — Z87440 Personal history of urinary (tract) infections: Secondary | ICD-10-CM | POA: Insufficient documentation

## 2020-05-19 HISTORY — PX: COLONOSCOPY WITH PROPOFOL: SHX5780

## 2020-05-19 SURGERY — COLONOSCOPY WITH PROPOFOL
Anesthesia: General

## 2020-05-19 MED ORDER — PROPOFOL 500 MG/50ML IV EMUL
INTRAVENOUS | Status: DC | PRN
  Administered 2020-05-19: 100 ug/kg/min via INTRAVENOUS

## 2020-05-19 MED ORDER — PROPOFOL 10 MG/ML IV BOLUS
INTRAVENOUS | Status: DC | PRN
  Administered 2020-05-19 (×2): 50 mg via INTRAVENOUS
  Administered 2020-05-19: 30 mg via INTRAVENOUS

## 2020-05-19 MED ORDER — PROPOFOL 500 MG/50ML IV EMUL
INTRAVENOUS | Status: AC
  Filled 2020-05-19: qty 50

## 2020-05-19 MED ORDER — SODIUM CHLORIDE 0.9 % IV SOLN
INTRAVENOUS | Status: DC
  Administered 2020-05-19: 1000 mL via INTRAVENOUS

## 2020-05-19 NOTE — Anesthesia Preprocedure Evaluation (Signed)
Anesthesia Evaluation  Patient identified by MRN, date of birth, ID band Patient awake    Reviewed: Allergy & Precautions, H&P , NPO status , Patient's Chart, lab work & pertinent test results, reviewed documented beta blocker date and time   History of Anesthesia Complications (+) PONV and history of anesthetic complications  Airway Mallampati: II  TM Distance: >3 FB Neck ROM: full    Dental  (+) Dental Advidsory Given, Teeth Intact   Pulmonary neg pulmonary ROS, former smoker,    Pulmonary exam normal breath sounds clear to auscultation       Cardiovascular Exercise Tolerance: Good hypertension, (-) angina(-) Past MI and (-) Cardiac Stents Normal cardiovascular exam(-) dysrhythmias (-) Valvular Problems/Murmurs Rhythm:regular Rate:Normal     Neuro/Psych PSYCHIATRIC DISORDERS Anxiety negative neurological ROS     GI/Hepatic negative GI ROS, Neg liver ROS, GERD  ,  Endo/Other  negative endocrine ROS  Renal/GU negative Renal ROS  negative genitourinary   Musculoskeletal   Abdominal   Peds  Hematology negative hematology ROS (+)   Anesthesia Other Findings Past Medical History: No date: Abnormal thyroid function test No date: Anxiety 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 2014: Colon polyp No date: Enlarged thyroid No date: Hypertension No date: UTI (urinary tract infection)   Reproductive/Obstetrics negative OB ROS                             Anesthesia Physical  Anesthesia Plan  ASA: II  Anesthesia Plan: General   Post-op Pain Management:    Induction: Intravenous  PONV Risk Score and Plan: 4 or greater and Propofol infusion and TIVA  Airway Management Planned: Natural Airway and Nasal Cannula  Additional Equipment:   Intra-op Plan:   Post-operative Plan:   Informed Consent: I have reviewed the patients History and Physical,  chart, labs and discussed the procedure including the risks, benefits and alternatives for the proposed anesthesia with the patient or authorized representative who has indicated his/her understanding and acceptance.     Dental Advisory Given  Plan Discussed with: Anesthesiologist, CRNA and Surgeon  Anesthesia Plan Comments:         Anesthesia Quick Evaluation

## 2020-05-19 NOTE — Transfer of Care (Signed)
Immediate Anesthesia Transfer of Care Note  Patient: Kelly Pittman  Procedure(s) Performed: COLONOSCOPY WITH PROPOFOL (N/A )  Patient Location: PACU  Anesthesia Type:General  Level of Consciousness: sedated  Airway & Oxygen Therapy: Patient Spontanous Breathing and Patient connected to nasal cannula oxygen  Post-op Assessment: Report given to RN and Post -op Vital signs reviewed and stable  Post vital signs: Reviewed and stable  Last Vitals:  Vitals Value Taken Time  BP    Temp    Pulse 63 05/19/20 1338  Resp 18 05/19/20 1338  SpO2 100 % 05/19/20 1338  Vitals shown include unvalidated device data.  Last Pain:  Vitals:   05/19/20 1234  TempSrc: Temporal         Complications: No complications documented.

## 2020-05-19 NOTE — Op Note (Signed)
[facility] Gastroenterology Patient Name: Kelly Pittman Procedure Date: 05/19/2020 12:58 PM MRN: [id number] Account #: [id number] Date of Birth: 1966/11/08 Admit Type: Outpatient Age: 54 Room: [facility] ENDO ROOM 1 Gender: Female Note Status: Finalized Procedure:             Colonoscopy Indications:           Screening for colorectal malignant neoplasm Providers:             Jonathon Bellows MD, MD Referring MD:          Nino Glow Mclean-Scocuzza MD, MD (Referring MD) Medicines:             Monitored Anesthesia Care Complications:         No immediate complications. Procedure:             Pre-Anesthesia Assessment:                        - Prior to the procedure, a History and Physical was                         performed, and patient medications, allergies and                         sensitivities were reviewed. The patient's tolerance                         of previous anesthesia was reviewed.                        - The risks and benefits of the procedure and the                         sedation options and risks were discussed with the                         patient. All questions were answered and informed                         consent was obtained.                        - ASA Grade Assessment: II - A patient with mild                         systemic disease.                        After obtaining informed consent, the colonoscope was                         passed under direct vision. Throughout the procedure,                         the patient's blood pressure, pulse, and oxygen                         saturations were monitored continuously. The                         Colonoscope was introduced through the  anus and                         advanced to the the cecum, identified by the                         appendiceal orifice. The colonoscopy was performed                         with ease. The patient tolerated the procedure well.                          The quality of the bowel preparation was fair. Findings:      The perianal and digital rectal examinations were normal.      A 10 mm polyp was found in the descending colon. The polyp was sessile.       The polyp was removed with a cold snare. Resection and retrieval were       complete.      The exam was otherwise without abnormality on direct and retroflexion       views. Impression:            - Preparation of the colon was fair.                        - One 10 mm polyp in the descending colon, removed                         with a cold snare. Resected and retrieved.                        - The examination was otherwise normal on direct and                         retroflexion views. Recommendation:        - Discharge patient to home (with escort).                        - Resume previous diet.                        - Continue present medications.                        - Await pathology results.                        - Repeat colonoscopy in 6 months because the bowel                         preparation was suboptimal. Procedure Code(s):     --- Professional ---                        [phone], Colonoscopy, flexible; with removal of                         tumor(s), polyp(s), or other lesion(s) by snare                         technique Diagnosis  Code(s):     --- Professional ---                        Z12.11, Encounter for screening for malignant neoplasm                         of colon                        K63.5, Polyp of colon CPT copyright 2019 American Medical Association. All rights reserved. The codes documented in this report are preliminary and upon coder review may  be revised to meet current compliance requirements. Jonathon Bellows, MD Jonathon Bellows MD, MD 05/19/2020 1:36:05 PM This report has been signed electronically. Number of Addenda: 0 Note Initiated On: 05/19/2020 12:58 PM Scope Withdrawal Time: 0 hours 9 minutes 39 seconds  Total Procedure Duration: 0 hours 13  minutes 15 seconds  Estimated Blood Loss:  Estimated blood loss: none.      [facility]

## 2020-05-19 NOTE — H&P (Signed)
Jonathon Bellows, MD [address] [place], [place], [place], [address] [address], [place], [place], [place], [address] Phone: [phone]  Fax: [phone]  Primary Care Physician:  McLean-Scocuzza, Nino Glow, MD   Pre-Procedure History & Physical: HPI:  Kelly Pittman is a 54 y.o. female is here for an colonoscopy.   Past Medical History:  Diagnosis Date  . Abnormal thyroid function test   . Anxiety   . 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 Colon polyp 2014  . Enlarged thyroid   . Hypertension   . UTI (urinary tract infection)     Past Surgical History:  Procedure Laterality Date  . ABDOMINAL HYSTERECTOMY     2009  . BREAST BIOPSY Right 2003  . CARPAL TUNNEL RELEASE    . COLONOSCOPY WITH PROPOFOL N/A 05/18/2020   Procedure: COLONOSCOPY WITH PROPOFOL;  Surgeon: Jonathon Bellows, MD;  Location: [facility] ENDOSCOPY;  Service: Gastroenterology;  Laterality: N/A;  . FOOT SURGERY     right  . TONSILLECTOMY     1987  . UNILATERAL SALPINGECTOMY     left    Prior to Admission medications   Medication Sig Start Date End Date Taking? Authorizing Provider  ALPRAZolam (XANAX) 0.25 MG tablet Take 1 tablet (0.25 mg total) by mouth at bedtime as needed. 02/11/20  Yes McLean-Scocuzza, Nino Glow, MD  CA-MG-VALERIAN-PASSIF-GINGKO PO Take by mouth.   Yes [provider]  Cholecalciferol (CVS D3) 125 MCG (5000 UT) capsule Take 1 capsule (5,000 Units total) by mouth daily. 02/20/19  Yes McLean-Scocuzza, Nino Glow, MD  cyanocobalamin 100 MCG tablet Take 100 mcg by mouth daily.   Yes [provider]  escitalopram (LEXAPRO) 10 MG tablet Take 1 tablet (10 mg total) by mouth daily. 09/10/19  Yes McLean-Scocuzza, Nino Glow, MD  hydrochlorothiazide (HYDRODIURIL) 12.5 MG tablet Take 1 tablet (12.5 mg total) by mouth daily. In am 09/10/19  Yes McLean-Scocuzza, Nino Glow, MD  levocetirizine (XYZAL) 5 MG tablet Take 1 tablet (5 mg total) by mouth every evening.  09/10/19  Yes McLean-Scocuzza, Nino Glow, MD  mometasone (NASONEX) 50 MCG/ACT nasal spray Place 2 sprays into the nose daily. 1x 02/11/20  Yes McLean-Scocuzza, Nino Glow, MD  Multiple Vitamin (MULTIVITAMIN) capsule Take by mouth daily.    Yes [provider]  Nutritional Supplements (ESTROVEN PO) Take by mouth.   Yes [provider]  pantoprazole (PROTONIX) 40 MG tablet Take 1 tablet (40 mg total) by mouth daily. 09/10/19  Yes McLean-Scocuzza, Nino Glow, MD  Potassium Chloride ER 20 MEQ TBCR 20 meq daily 09/10/19  Yes McLean-Scocuzza, Nino Glow, MD  telmisartan (MICARDIS) 20 MG tablet Take 1 tablet (20 mg total) by mouth daily. 09/10/19  Yes McLean-Scocuzza, Nino Glow, MD    Allergies as of 05/18/2020 - Review Complete 05/18/2020  Allergen Reaction Noted  . Losartan potassium-hctz  06/07/2017  . Tape Rash 03/23/2016    Family History  Problem Relation Age of Onset  . Hypertension Mother   . Hyperlipidemia Mother   . Hypertension Father   . Breast cancer Neg Hx   . Ovarian cancer Neg Hx   . Stroke Neg Hx   . Thyroid disease Neg Hx     Social History   Socioeconomic History  . Marital status: Married    Spouse name: Not on file  . Number of children: Not on file  . Years of education: Not on file  . Highest education level: Not on  file  Occupational History  . Not on file  Tobacco Use  . Smoking status: Former Smoker    Packs/day: 0.25    Quit date: 2020    Years since quitting: 1.5  . Smokeless tobacco: Never Used  Vaping Use  . Vaping Use: Never used  Substance and Sexual Activity  . Alcohol use: Yes    Comment: socially  . Drug use: No  . Sexual activity: Yes    Birth control/protection: Surgical  Other Topics Concern  . 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 Difficulty of Paying Living Expenses:   Food Insecurity:   . Worried About Charity fundraiser in the Last Year:   . Arboriculturist in the Last Year:   Transportation Needs:   . Film/video editor (Medical):   Marland Kitchen Lack of Transportation (Non-Medical):   Physical Activity:   . Days of Exercise per Week:   . Minutes of Exercise per Session:   Stress:   . Feeling of Stress :   Social Connections:   . Frequency of Communication with Friends and Family:   . Frequency of Social Gatherings with Friends and Family:   . Attends Religious Services:   . Active Member of Clubs or Organizations:   . Attends Archivist Meetings:   Marland Kitchen Marital Status:   Intimate Partner Violence:   . Fear of Current or Ex-Partner:   . Emotionally Abused:   Marland Kitchen Physically Abused:   . Sexually Abused:     Review of Systems: See HPI, otherwise negative ROS  Physical Exam: BP (!) 148/82   Pulse (!) 52   Temp (!) 96.8 F (36 C) (Temporal)   Resp 18   Ht 5\' 4"  (1.626 m)   Wt 92.4 kg   SpO2 100%   BMI 34.97 kg/m  General:   Alert,  pleasant and cooperative in NAD Head:  Normocephalic and atraumatic. Neck:  Supple; no masses or thyromegaly. Lungs:  Clear throughout to auscultation, normal respiratory effort.    Heart:  +S1, +S2, Regular rate and rhythm, No edema. Abdomen:  Soft, nontender and nondistended. Normal bowel sounds, without guarding, and without rebound.   Neurologic:  Alert and  oriented x4;  grossly normal neurologically.  Impression/Plan: Kelly Pittman is here for an colonoscopy to be performed for Screening colonoscopy average risk   Risks, benefits, limitations, and alternatives regarding  colonoscopy have been reviewed with the patient.  Questions have been answered.  All parties agreeable.   Jonathon Bellows, MD  05/19/2020, 12:48 PM

## 2020-05-20 ENCOUNTER — Encounter: Payer: Self-pay | Admitting: Gastroenterology

## 2020-05-20 LAB — SURGICAL PATHOLOGY

## 2020-05-20 NOTE — Anesthesia Postprocedure Evaluation (Signed)
Anesthesia Post Note  Patient: Kelly Pittman  Procedure(s) Performed: COLONOSCOPY WITH PROPOFOL (N/A )  Patient location during evaluation: Endoscopy Anesthesia Type: General Level of consciousness: awake and alert and oriented Pain management: pain level controlled Vital Signs Assessment: post-procedure vital signs reviewed and stable Respiratory status: spontaneous breathing Cardiovascular status: blood pressure returned to baseline Anesthetic complications: no   No complications documented.   Last Vitals:  Vitals:   05/19/20 1338 05/19/20 1358  BP: 128/75 (!) 143/72  Pulse:    Resp:    Temp: 36.7 C   SpO2:      Last Pain:  Vitals:   05/19/20 1358  TempSrc:   PainSc: 0-No pain                 Starling Christofferson

## 2020-05-24 ENCOUNTER — Encounter: Payer: Self-pay | Admitting: Gastroenterology

## 2020-05-28 ENCOUNTER — Telehealth: Payer: Self-pay

## 2020-05-28 NOTE — Telephone Encounter (Signed)
-----   Message from Jonathon Bellows, MD sent at 05/27/2020  2:02 PM EDT ----- Kelly Pittman   Inform patient that the polyp was precancerous but since her prep was suboptimal suggest a repeat colonoscopy with a 2-day prep within 6 months  C/c McLean-Scocuzza, Nino Glow, MD   Dr Jonathon Bellows MD,MRCP [facility]) Gastroenterology/Hepatology Pager: [phone]

## 2020-05-28 NOTE — Telephone Encounter (Signed)
Pt has been notified of results and Dr. Anna's recommendations. 

## 2020-06-09 ENCOUNTER — Ambulatory Visit
Admission: RE | Admit: 2020-06-09 | Discharge: 2020-06-09 | Disposition: A | Discharge status: Home / Self Care | Payer: BC Managed Care – PPO | Source: Ambulatory Visit | Attending: Internal Medicine | Admitting: Internal Medicine

## 2020-06-09 DIAGNOSIS — Z1231 Encounter for screening mammogram for malignant neoplasm of breast: Secondary | ICD-10-CM | POA: Diagnosis not present

## 2020-06-15 ENCOUNTER — Encounter: Payer: Self-pay | Admitting: Internal Medicine

## 2020-06-15 ENCOUNTER — Telehealth (INDEPENDENT_AMBULATORY_CARE_PROVIDER_SITE_OTHER): Payer: BC Managed Care – PPO | Admitting: Internal Medicine

## 2020-06-15 VITALS — BP 143/62 | HR 59 | Ht 64.0 in | Wt 203.7 lb

## 2020-06-15 DIAGNOSIS — B3731 Acute candidiasis of vulva and vagina: Secondary | ICD-10-CM

## 2020-06-15 DIAGNOSIS — J9801 Acute bronchospasm: Secondary | ICD-10-CM

## 2020-06-15 DIAGNOSIS — J040 Acute laryngitis: Secondary | ICD-10-CM

## 2020-06-15 DIAGNOSIS — J029 Acute pharyngitis, unspecified: Secondary | ICD-10-CM | POA: Diagnosis not present

## 2020-06-15 DIAGNOSIS — R110 Nausea: Secondary | ICD-10-CM

## 2020-06-15 DIAGNOSIS — B373 Candidiasis of vulva and vagina: Secondary | ICD-10-CM | POA: Diagnosis not present

## 2020-06-15 MED ORDER — FLUCONAZOLE 150 MG PO TABS: 150.0000 mg | ORAL_TABLET | Freq: Once | ORAL | 0 refills | Status: AC

## 2020-06-15 MED ORDER — ALBUTEROL SULFATE HFA 108 (90 BASE) MCG/ACT IN AERS: 1.0000 | INHALATION_SPRAY | Freq: Four times a day (QID) | RESPIRATORY_TRACT | 0 refills | Status: DC | PRN

## 2020-06-15 MED ORDER — AMOXICILLIN-POT CLAVULANATE 875-125 MG PO TABS: 1.0000 | ORAL_TABLET | Freq: Two times a day (BID) | ORAL | 0 refills | Status: DC

## 2020-06-15 NOTE — Progress Notes (Signed)
Virtual Visit via Video Note  I connected with Kelly Pittman  on 06/15/20 at  3:50 PM EDT by a video enabled telemedicine application and verified that I am speaking with the correct person using two identifiers.  Location patient: home Location provider:work or home office Persons participating in the virtual visit: patient, provider  I discussed the limitations of evaluation and management by telemedicine and the availability of in person appointments. The patient expressed understanding and agreed to proceed.   HPI: Sick visit c/o sore throat, h/a, nasal congestion, hoarseness noted Saturday am. She is able to smell, throat is red w/o fever. She does feel sweaty at night and hot during the day and c/o fatigue worse Saturday and Sunday and nausea w/o eating in the am. She is also sob with exertion, has dry cough w/o wheezing.    ROS: See pertinent positives and negatives per HPI.  Past Medical History:  Diagnosis Date  . Abnormal thyroid function test   . Anxiety   . 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 Colon polyp 2014  . Enlarged thyroid   . Hypertension   . UTI (urinary tract infection)     Past Surgical History:  Procedure Laterality Date  . ABDOMINAL HYSTERECTOMY     2009  . BREAST BIOPSY Right 2003  . CARPAL TUNNEL RELEASE    . COLONOSCOPY WITH PROPOFOL N/A 05/18/2020   Procedure: COLONOSCOPY WITH PROPOFOL;  Surgeon: Anna, Kiran, MD;  Location: [facility] ENDOSCOPY;  Service: Gastroenterology;  Laterality: N/A;  . COLONOSCOPY WITH PROPOFOL N/A 05/19/2020   Procedure: COLONOSCOPY WITH PROPOFOL;  Surgeon: Anna, Kiran, MD;  Location: [facility] ENDOSCOPY;  Service: Gastroenterology;  Laterality: N/A;  . FOOT SURGERY     right  . TONSILLECTOMY     19 87  . UNILATERAL SALPINGECTOMY     left    Family History  Problem Relation Age of Onset  . Hypertension Mother   . Hyperlipidemia Mother   . Hypertension Father   . Breast cancer Neg Hx   . Ovarian  cancer Neg Hx   . Stroke Neg Hx   . Thyroid disease Neg Hx     SOCIAL HX: married    Current Outpatient Medications:  .  CA-MG-VALERIAN-PASSIF-GINGKO PO, Take by mouth., Disp: , Rfl:  .  Cholecalciferol (CVS D3) 125 MCG (5000 UT) capsule, Take 1 capsule (5,000 Units total) by mouth daily., Disp: 90 capsule, Rfl: 3 .  cyanocobalamin 100 MCG tablet, Take 100 mcg by mouth daily., Disp: , Rfl:  .  escitalopram (LEXAPRO) 10 MG tablet, Take 1 tablet (10 mg total) by mouth daily., Disp: 90 tablet, Rfl: 3 .  hydrochlorothiazide (HYDRODIURIL) 12.5 MG tablet, Take 1 tablet (12.5 mg total) by mouth daily. In am, Disp: 90 tablet, Rfl: 3 .  levocetirizine (XYZAL) 5 MG tablet, Take 1 tablet (5 mg total) by mouth every evening., Disp: 90 tablet, Rfl: 3 .  mometasone (NASONEX) 50 MCG/ACT nasal spray, Place 2 sprays into the nose daily. 1x, Disp: 51 g, Rfl: 11 .  Multiple Vitamin (MULTIVITAMIN) capsule, Take by mouth daily. , Disp: , Rfl:  .  pantoprazole (PROTONIX) 40 MG tablet, Take 1 tablet (40 mg total) by mouth daily., Disp: 90 tablet, Rfl: 3 .  Potassium Chloride ER 20 MEQ TBCR, 20 meq daily, Disp: 90 tablet, Rfl: 3 .  telmisartan (MICARDIS) 20 MG tablet, Take 1 tablet (20 mg total) by mouth daily., Disp: 90 tablet, Rfl: 3 .  albuterol (VENTOLIN  HFA) 108 (90 Base) MCG/ACT inhaler, Inhale 1-2 puffs into the lungs every 6 (six) hours as needed for wheezing or shortness of breath., Disp: 19 g, Rfl: 0 .  ALPRAZolam (XANAX) 0.25 MG tablet, Take 1 tablet (0.25 mg total) by mouth at bedtime as needed. (Patient not taking: Reported on 06/15/2020), Disp: 30 tablet, Rfl: 5 .  amoxicillin-clavulanate (AUGMENTIN) 875-125 MG tablet, Take 1 tablet by mouth 2 (two) times daily. With food, Disp: 14 tablet, Rfl: 0 .  fluconazole (DIFLUCAN) 150 MG tablet, Take 1 tablet (150 mg total) by mouth once for 1 dose., Disp: 1 tablet, Rfl: 0 .  Nutritional Supplements (ESTROVEN PO), Take by mouth. (Patient not taking: Reported on  06/15/2020), Disp: , Rfl:   EXAM:  VITALS per patient if applicable:  GENERAL: alert, oriented, appears well and in no acute distress  HEENT: +hoarse   atraumatic, conjunttiva clear, no obvious abnormalities on inspection of external nose and ears  NECK: normal movements of the head and neck  LUNGS: on inspection no signs of respiratory distress, breathing rate appears normal, no obvious gross SOB, gasping or wheezing  CV: no obvious cyanosis  MS: moves all visible extremities without noticeable abnormality  PSYCH/NEURO: pleasant and cooperative, no obvious depression or anxiety, speech and thought processing grossly intact  ASSESSMENT AND PLAN:  Discussed the following assessment and plan:  Sore throat ?viral vs bacterial- Plan: amoxicillin-clavulanate (AUGMENTIN) 875-125 MG tablet bid #14   Laryngitis - Plan: amoxicillin-clavulanate (AUGMENTIN) 875-125 MG tablet Supportive care Warm tea with honey and lemon voice rest  Bronchospasm - Plan: albuterol (VENTOLIN HFA) 108 (90 Base) MCG/ACT inhaler  Yeast vaginitis - Plan: fluconazole (DIFLUCAN) 150 MG tablet  Nausea Intermittent   HM Flu shothad9/28/20 Tdap had 03/30/17 Consider pna 23but quit 2.5 months since 09/10/19 will hold until age 54 y.o  shingrix2/2 utd covid 2/2 pfizer   Mammogram neg 06/09/2020 negative  Pap appears not done saw Dr. Marcelline Mates 04/09/18 per pt had pap per but Dr. Dessie Coma notes no pap s/p hysterectomy no h/o abnormal papand no need further pap  Colonoscopy h/o polyps need to get record from [facility] last colonoscopy in [place] and colonoscopy -05/19/20 poor prep 1 polyp repeat in 6 months serrated polyp  DEXA consider in future with h/o hysterectomy   Thyroid US no further w/u 09/2019   -of note also need echo report-2nd request Smoker 2 cig/qd stopped7 or 8/2020started in 20slight smoker  rec healthy diet and exercise   -we  discussed possible serious and likely etiologies, options for evaluation and workup, limitations of telemedicine visit vs in person visit, treatment, treatment risks and precautions. Pt prefers to treat via telemedicine empirically rather then risking or undertaking an in person visit at this moment. Patient agrees to seek prompt in person care if worsening, new symptoms arise, or if is not improving with treatment.   I discussed the assessment and treatment plan with the patient. The patient was provided an opportunity to ask questions and all were answered. The patient agreed with the plan and demonstrated an understanding of the instructions.   The patient was advised to call back or seek an in-person evaluation if the symptoms worsen or if the condition fails to improve as anticipated.  Time spent 20 minutes Delorise Jackson, MD

## 2020-06-15 NOTE — Progress Notes (Signed)
Patient presenting with sore throat and headaches. Was tested negative for COVID-19. Onset of symptoms this last Saturday. Patient was lethargic Saturday and Sunday. Feels hot but no fever.  No known contact with sick persons.  States feels like a sinus headache, having a lot of pressure and congestion.

## 2020-07-20 ENCOUNTER — Telehealth: Payer: Self-pay | Admitting: Obstetrics and Gynecology

## 2020-07-20 ENCOUNTER — Telehealth: Payer: Self-pay | Admitting: Internal Medicine

## 2020-07-20 NOTE — Telephone Encounter (Signed)
Patient called in stating that a antibiotic that she was taking gave her a yeast infection. Patient is requesting a Rx be sent in to treat this. Informed her that I would send a message to her provide rand to allow 24-48 hours to respond.

## 2020-07-20 NOTE — Telephone Encounter (Signed)
Patient doesn't thing she has a yeast infection, cancel request. Patient states she thinks she has a UTI, she is going to Urgent care across the street after work.

## 2020-07-20 NOTE — Telephone Encounter (Signed)
Patient called in wanted to know if she can get refill on fluconazole (DIFLUCAN) 150 MG tablet

## 2020-07-21 NOTE — Telephone Encounter (Signed)
Left message to return call.  If patient calling back in, it is okay to scheduled a virtual appointment and lab appointment for that same day if she passes the COVID-19 screening. No medication will be sent in with out a urine sample collected.

## 2020-07-21 NOTE — Telephone Encounter (Signed)
Pt wants to know if she could do a virtual visit for possible UTI and pt did not go to Urgent care. Please advise.

## 2020-07-22 MED ORDER — FLUCONAZOLE 150 MG PO TABS: 150.0000 mg | ORAL_TABLET | Freq: Once | ORAL | 1 refills | Status: AC

## 2020-07-22 NOTE — Telephone Encounter (Signed)
Pt is aware that the medication Diflucan has been sent to her pharmacy of choice.

## 2020-07-23 ENCOUNTER — Ambulatory Visit: Payer: BC Managed Care – PPO

## 2020-07-23 NOTE — Telephone Encounter (Signed)
Patient calling back in and was informed of the below. She states she went to Urgent care yesterday and they gave her an antibiotic.

## 2020-07-23 NOTE — Telephone Encounter (Signed)
Left message to return call 

## 2020-08-13 ENCOUNTER — Ambulatory Visit: Payer: BC Managed Care – PPO | Admitting: Internal Medicine

## 2020-08-23 ENCOUNTER — Ambulatory Visit: Payer: BC Managed Care – PPO | Admitting: Podiatry

## 2020-08-23 ENCOUNTER — Other Ambulatory Visit: Payer: Self-pay

## 2020-08-23 ENCOUNTER — Encounter: Payer: Self-pay | Admitting: Podiatry

## 2020-08-23 ENCOUNTER — Ambulatory Visit (INDEPENDENT_AMBULATORY_CARE_PROVIDER_SITE_OTHER): Payer: BC Managed Care – PPO

## 2020-08-23 DIAGNOSIS — G5791 Unspecified mononeuropathy of right lower limb: Secondary | ICD-10-CM | POA: Diagnosis not present

## 2020-08-23 DIAGNOSIS — M7661 Achilles tendinitis, right leg: Secondary | ICD-10-CM

## 2020-08-23 MED ORDER — PENNSAID 2 % EX SOLN: 1.0000 "application " | Freq: Two times a day (BID) | CUTANEOUS | 3 refills | Status: DC

## 2020-08-23 NOTE — Progress Notes (Signed)
Subjective:  Patient ID: Kelly Pittman, female    DOB: November 26, 1965,  MRN: [id number] HPI Chief Complaint  Patient presents with  . Foot Pain    Patient presents today for right achilles/lateral side foot "it feels like a pulsating feeling, no pain, but radiating sensation and its tender to touch at my incision site if I don't keep it moisturized."    54 y.o. female presents with the above complaint.   ROS: Denies fever chills nausea vomiting muscle aches pains calf pain back pain chest pain shortness of breath.  Past Medical History:  Diagnosis Date  . Abnormal thyroid function test   . Anxiety   . 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 Colon polyp 2014  . Enlarged thyroid   . Hypertension   . UTI (urinary tract infection)    Past Surgical History:  Procedure Laterality Date  . ABDOMINAL HYSTERECTOMY     2009  . BREAST BIOPSY Right 2003  . CARPAL TUNNEL RELEASE    . COLONOSCOPY WITH PROPOFOL N/A 05/18/2020   Procedure: COLONOSCOPY WITH PROPOFOL;  Surgeon: Jonathon Bellows, MD;  Location: [facility] ENDOSCOPY;  Service: Gastroenterology;  Laterality: N/A;  . COLONOSCOPY WITH PROPOFOL N/A 05/19/2020   Procedure: COLONOSCOPY WITH PROPOFOL;  Surgeon: Jonathon Bellows, MD;  Location: [facility] ENDOSCOPY;  Service: Gastroenterology;  Laterality: N/A;  . FOOT SURGERY     right  . TONSILLECTOMY     1987  . UNILATERAL SALPINGECTOMY     left    Current Outpatient Medications:  .  albuterol (VENTOLIN HFA) 108 (90 Base) MCG/ACT inhaler, Inhale 1-2 puffs into the lungs every 6 (six) hours as needed for wheezing or shortness of breath., Disp: 19 g, Rfl: 0 .  ALPRAZolam (XANAX) 0.25 MG tablet, Take 1 tablet (0.25 mg total) by mouth at bedtime as needed. (Patient not taking: Reported on 06/15/2020), Disp: 30 tablet, Rfl: 5 .  CA-MG-VALERIAN-PASSIF-GINGKO PO, Take by mouth., Disp: , Rfl:  .  Cholecalciferol (CVS D3) 125 MCG (5000 UT) capsule, Take 1 capsule (5,000 Units total) by  mouth daily., Disp: 90 capsule, Rfl: 3 .  cyanocobalamin 100 MCG tablet, Take 100 mcg by mouth daily., Disp: , Rfl:  .  Diclofenac Sodium (PENNSAID) 2 % SOLN, Apply 1 application topically in the morning and at bedtime., Disp: 112 g, Rfl: 3 .  escitalopram (LEXAPRO) 10 MG tablet, Take 1 tablet (10 mg total) by mouth daily., Disp: 90 tablet, Rfl: 3 .  hydrochlorothiazide (HYDRODIURIL) 12.5 MG tablet, Take 1 tablet (12.5 mg total) by mouth daily. In am, Disp: 90 tablet, Rfl: 3 .  levocetirizine (XYZAL) 5 MG tablet, Take 1 tablet (5 mg total) by mouth every evening., Disp: 90 tablet, Rfl: 3 .  Multiple Vitamin (MULTIVITAMIN) capsule, Take by mouth daily. , Disp: , Rfl:  .  Nutritional Supplements (ESTROVEN PO), Take by mouth. (Patient not taking: Reported on 06/15/2020), Disp: , Rfl:  .  pantoprazole (PROTONIX) 40 MG tablet, Take 1 tablet (40 mg total) by mouth daily., Disp: 90 tablet, Rfl: 3 .  potassium chloride SA (KLOR-CON) 20 MEQ tablet, Take by mouth., Disp: , Rfl:  .  telmisartan (MICARDIS) 20 MG tablet, Take 1 tablet (20 mg total) by mouth daily., Disp: 90 tablet, Rfl: 3  Allergies  Allergen Reactions  . Losartan Potassium-Hctz     Other reaction(s): Other (See Comments) Weight gain, constipation  . Tape Rash   Review of Systems Objective:  There were no vitals filed for  this visit.  General: Well developed, nourished, in no acute distress, alert and oriented x3   Dermatological: Skin is warm, dry and supple bilateral. Nails x 10 are well maintained; remaining integument appears unremarkable at this time. There are no open sores, no preulcerative lesions, no rash or signs of infection present.  Vascular: Dorsalis Pedis artery and Posterior Tibial artery pedal pulses are 2/4 bilateral with immedate capillary fill time. Pedal hair growth present. No varicosities and no lower extremity edema present bilateral.   Neruologic: Grossly intact via light touch bilateral. Vibratory intact via  tuning fork bilateral. Protective threshold with Semmes Wienstein monofilament intact to all pedal sites bilateral. Patellar and Achilles deep tendon reflexes 2+ bilateral. No Babinski or clonus noted bilateral.  No reproducible pain that would generate a radiating or the vibratory sensation that she is feeling in her posterior leg along her incision and along the lateral incision where her peroneal tendon repair was performed this is nonreproducible.  Musculoskeletal: No gross boney pedal deformities bilateral. No pain, crepitus, or limitation noted with foot and ankle range of motion bilateral. Muscular strength 5/5 in all groups tested bilateral.  Gait: Unassisted, Nonantalgic.    Radiographs:  Radiographs taken today demonstrate only slight thickening of the Achilles but the margins appear to be normal so it does not demonstrate any inflammation.  A posterior heel spur but it is anterior to the Achilles most likely this is where she had a flexor hallucis longus transfer.   Assessment & Plan:   Assessment: Neuritis  Plan: She is going to start using Voltaren gel initially if this does not alleviate her symptoms then we will start her on methylprednisolone.     Syed Zukas T. [place], [place]

## 2020-08-24 ENCOUNTER — Other Ambulatory Visit: Payer: Self-pay

## 2020-08-24 ENCOUNTER — Other Ambulatory Visit: Payer: Self-pay | Admitting: Podiatry

## 2020-08-24 DIAGNOSIS — G5791 Unspecified mononeuropathy of right lower limb: Secondary | ICD-10-CM

## 2020-08-24 DIAGNOSIS — I10 Essential (primary) hypertension: Secondary | ICD-10-CM

## 2020-08-24 MED ORDER — HYDROCHLOROTHIAZIDE 12.5 MG PO TABS: 12.5000 mg | ORAL_TABLET | Freq: Every day | ORAL | 3 refills | Status: DC

## 2020-08-31 ENCOUNTER — Other Ambulatory Visit: Payer: Self-pay

## 2020-08-31 DIAGNOSIS — J309 Allergic rhinitis, unspecified: Secondary | ICD-10-CM

## 2020-08-31 MED ORDER — LEVOCETIRIZINE DIHYDROCHLORIDE 5 MG PO TABS: 5.0000 mg | ORAL_TABLET | Freq: Every evening | ORAL | 3 refills | Status: DC

## 2020-09-23 ENCOUNTER — Encounter: Payer: BC Managed Care – PPO | Admitting: Obstetrics and Gynecology

## 2020-09-23 ENCOUNTER — Ambulatory Visit: Payer: BC Managed Care – PPO | Admitting: Internal Medicine

## 2020-09-23 ENCOUNTER — Other Ambulatory Visit: Payer: Self-pay

## 2020-09-23 ENCOUNTER — Encounter: Payer: Self-pay | Admitting: Internal Medicine

## 2020-09-23 VITALS — BP 130/80 | HR 60 | Temp 98.6°F | Ht 64.0 in | Wt 209.0 lb

## 2020-09-23 DIAGNOSIS — Z0000 Encounter for general adult medical examination without abnormal findings: Secondary | ICD-10-CM

## 2020-09-23 DIAGNOSIS — I10 Essential (primary) hypertension: Secondary | ICD-10-CM

## 2020-09-23 DIAGNOSIS — Z13818 Encounter for screening for other digestive system disorders: Secondary | ICD-10-CM

## 2020-09-23 DIAGNOSIS — Z1329 Encounter for screening for other suspected endocrine disorder: Secondary | ICD-10-CM | POA: Diagnosis not present

## 2020-09-23 DIAGNOSIS — M1990 Unspecified osteoarthritis, unspecified site: Secondary | ICD-10-CM

## 2020-09-23 DIAGNOSIS — E876 Hypokalemia: Secondary | ICD-10-CM

## 2020-09-23 DIAGNOSIS — J029 Acute pharyngitis, unspecified: Secondary | ICD-10-CM

## 2020-09-23 DIAGNOSIS — J9801 Acute bronchospasm: Secondary | ICD-10-CM

## 2020-09-23 DIAGNOSIS — E785 Hyperlipidemia, unspecified: Secondary | ICD-10-CM

## 2020-09-23 DIAGNOSIS — K219 Gastro-esophageal reflux disease without esophagitis: Secondary | ICD-10-CM | POA: Diagnosis not present

## 2020-09-23 DIAGNOSIS — F419 Anxiety disorder, unspecified: Secondary | ICD-10-CM

## 2020-09-23 LAB — COMPREHENSIVE METABOLIC PANEL WITH GFR
ALT: 10 U/L (ref 0–35)
AST: 15 U/L (ref 0–37)
Albumin: 4 g/dL (ref 3.5–5.2)
Alkaline Phosphatase: 95 U/L (ref 39–117)
BUN: 11 mg/dL (ref 6–23)
CO2: 32 meq/L (ref 19–32)
Calcium: 8.9 mg/dL (ref 8.4–10.5)
Chloride: 104 meq/L (ref 96–112)
Creatinine, Ser: 0.84 mg/dL (ref 0.40–1.20)
GFR: 78.63 mL/min
Glucose, Bld: 81 mg/dL (ref 70–99)
Potassium: 4 meq/L (ref 3.5–5.1)
Sodium: 141 meq/L (ref 135–145)
Total Bilirubin: 0.4 mg/dL (ref 0.2–1.2)
Total Protein: 6.5 g/dL (ref 6.0–8.3)

## 2020-09-23 LAB — LIPID PANEL
Cholesterol: 180 mg/dL (ref 0–200)
HDL: 48 mg/dL (ref >39.00)
LDL Cholesterol: 111 mg/dL — ABNORMAL HIGH (ref 0–99)
NonHDL: 132.38
Total CHOL/HDL Ratio: 4
Triglycerides: 109 mg/dL (ref 0.0–149.0)
VLDL: 21.8 mg/dL (ref 0.0–40.0)

## 2020-09-23 LAB — CBC WITH DIFFERENTIAL/PLATELET
Basophils Absolute: 0 10*3/uL (ref 0.0–0.1)
Basophils Relative: 0.9 % (ref 0.0–3.0)
Eosinophils Absolute: 0.2 10*3/uL (ref 0.0–0.7)
Eosinophils Relative: 3.3 % (ref 0.0–5.0)
HCT: 37.5 % (ref 36.0–46.0)
Hemoglobin: 12.4 g/dL (ref 12.0–15.0)
Lymphocytes Relative: 51.9 % — ABNORMAL HIGH (ref 12.0–46.0)
Lymphs Abs: 2.6 10*3/uL (ref 0.7–4.0)
MCHC: 33.1 g/dL (ref 30.0–36.0)
MCV: 91.1 fl (ref 78.0–100.0)
Monocytes Absolute: 0.2 10*3/uL (ref 0.1–1.0)
Monocytes Relative: 4.6 % (ref 3.0–12.0)
Neutro Abs: 2 10*3/uL (ref 1.4–7.7)
Neutrophils Relative %: 39.3 % — ABNORMAL LOW (ref 43.0–77.0)
Platelets: 290 10*3/uL (ref 150.0–400.0)
RBC: 4.12 Mil/uL (ref 3.87–5.11)
RDW: 14.1 % (ref 11.5–15.5)
WBC: 5 10*3/uL (ref 4.0–10.5)

## 2020-09-23 LAB — TSH: TSH: 0.68 u[IU]/mL (ref 0.35–4.50)

## 2020-09-23 MED ORDER — ALPRAZOLAM 0.25 MG PO TABS: 0.2500 mg | ORAL_TABLET | Freq: Every evening | ORAL | 5 refills | Status: DC | PRN

## 2020-09-23 MED ORDER — ESCITALOPRAM OXALATE 10 MG PO TABS: 10.0000 mg | ORAL_TABLET | Freq: Every day | ORAL | 3 refills | Status: DC

## 2020-09-23 MED ORDER — ALBUTEROL SULFATE HFA 108 (90 BASE) MCG/ACT IN AERS: 1.0000 | INHALATION_SPRAY | Freq: Four times a day (QID) | RESPIRATORY_TRACT | 3 refills | Status: DC | PRN

## 2020-09-23 MED ORDER — POTASSIUM CHLORIDE CRYS ER 20 MEQ PO TBCR: 20.0000 meq | EXTENDED_RELEASE_TABLET | Freq: Every day | ORAL | 3 refills | Status: DC

## 2020-09-23 MED ORDER — PENNSAID 2 % EX SOLN: 1.0000 "application " | Freq: Two times a day (BID) | CUTANEOUS | 3 refills | Status: DC

## 2020-09-23 MED ORDER — TELMISARTAN 20 MG PO TABS: 20.0000 mg | ORAL_TABLET | Freq: Every day | ORAL | 3 refills | Status: DC

## 2020-09-23 MED ORDER — PANTOPRAZOLE SODIUM 40 MG PO TBEC: 40.0000 mg | DELAYED_RELEASE_TABLET | Freq: Every day | ORAL | 3 refills | Status: DC

## 2020-09-23 NOTE — Progress Notes (Signed)
Chief Complaint  Patient presents with  . Follow-up    6 mo   Annual  1. Doing well except for  2. Scratchy throat x 2 days tried sinus flush and will tray warm salt water gargles gerd is controlled no fever  3. Left inner labia lesion x months been there had appt Dr. Marcelline Mates today but was rescheduled will CC dr. Marcelline Mates to get pt in to see if needs bx    Review of Systems  Constitutional: Negative for weight loss.  HENT: Positive for sore throat. Negative for hearing loss.   Eyes: Negative for blurred vision.  Respiratory: Negative for shortness of breath.   Cardiovascular: Negative for chest pain.  Gastrointestinal: Negative for abdominal pain.  Musculoskeletal: Negative for falls.  Skin: Negative for rash.  Neurological: Negative for headaches.  Psychiatric/Behavioral: Negative for depression.   Past Medical History:  Diagnosis Date  . Abnormal thyroid function test   . Anxiety   . 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 Colon polyp 2014  . Enlarged thyroid   . Hypertension   . UTI (urinary tract infection)    Past Surgical History:  Procedure Laterality Date  . ABDOMINAL HYSTERECTOMY     2009  . BREAST BIOPSY Right 2003  . CARPAL TUNNEL RELEASE    . COLONOSCOPY WITH PROPOFOL N/A 05/18/2020   Procedure: COLONOSCOPY WITH PROPOFOL;  Surgeon: Jonathon Bellows, MD;  Location: [facility] ENDOSCOPY;  Service: Gastroenterology;  Laterality: N/A;  . COLONOSCOPY WITH PROPOFOL N/A 05/19/2020   Procedure: COLONOSCOPY WITH PROPOFOL;  Surgeon: Jonathon Bellows, MD;  Location: [facility] ENDOSCOPY;  Service: Gastroenterology;  Laterality: N/A;  . FOOT SURGERY     right  . TONSILLECTOMY     1987  . UNILATERAL SALPINGECTOMY     left   Family History  Problem Relation Age of Onset  . Hypertension Mother   . Hyperlipidemia Mother   . Hypertension Father   . Breast cancer Neg Hx   . Ovarian cancer Neg Hx   . Stroke Neg Hx   . Thyroid disease Neg Hx    Social History    Socioeconomic History  . Marital status: Married    Spouse name: Not on file  . Number of children: Not on file  . Years of education: Not on file  . Highest education level: Not on file  Occupational History  . Not on file  Tobacco Use  . Smoking status: Former Smoker    Packs/day: 0.25    Quit date: 2020    Years since quitting: 1.8  . Smokeless tobacco: Never Used  Vaping Use  . Vaping Use: Never used  Substance and Sexual Activity  . Alcohol use: Yes    Comment: socially  . Drug use: No  . Sexual activity: Yes    Birth control/protection: Surgical  Other Topics Concern  . 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 Difficulty of Paying Living Expenses: Not on file  Food Insecurity:   . Worried About Charity fundraiser in the  Last Year: Not on file  . Ran Out of Food in the Last Year: Not on file  Transportation Needs:   . Lack of Transportation (Medical): Not on file  . Lack of Transportation (Non-Medical): Not on file  Physical Activity:   . Days of Exercise per Week: Not on file  . Minutes of Exercise per Session: Not on file  Stress:   . Feeling of Stress : Not on file  Social Connections:   . Frequency of Communication with Friends and Family: Not on file  . Frequency of Social Gatherings with Friends and Family: Not on file  . Attends Religious Services: Not on file  . Active Member of Clubs or Organizations: Not on file  . Attends Archivist Meetings: Not on file  . Marital Status: Not on file  Intimate Partner Violence:   . Fear of Current or Ex-Partner: Not on file  . Emotionally Abused: Not on file  . Physically Abused: Not on file  .  Sexually Abused: Not on file   Current Meds  Medication Sig  . albuterol (VENTOLIN HFA) 108 (90 Base) MCG/ACT inhaler Inhale 1-2 puffs into the lungs every 6 (six) hours as needed for wheezing or shortness of breath.  . ALPRAZolam (XANAX) 0.25 MG tablet Take 1 tablet (0.25 mg total) by mouth at bedtime as needed.  Marland Kitchen CA-MG-VALERIAN-PASSIF-GINGKO PO Take by mouth.  . Cholecalciferol ([employer] D3) 125 MCG (5000 UT) capsule Take 1 capsule (5,000 Units total) by mouth daily.  . cyanocobalamin 100 MCG tablet Take 100 mcg by mouth daily.  . Diclofenac Sodium (PENNSAID) 2 % SOLN Apply 1 application topically in the morning and at bedtime.  Marland Kitchen escitalopram (LEXAPRO) 10 MG tablet Take 1 tablet (10 mg total) by mouth daily.  . hydrochlorothiazide (HYDRODIURIL) 12.5 MG tablet Take 1 tablet (12.5 mg total) by mouth daily. In am  . levocetirizine (XYZAL) 5 MG tablet Take 1 tablet (5 mg total) by mouth every evening.  . Multiple Vitamin (MULTIVITAMIN) capsule Take by mouth daily.   . Nutritional Supplements (ESTROVEN PO) Take by mouth.   . pantoprazole (PROTONIX) 40 MG tablet Take 1 tablet (40 mg total) by mouth daily.  . potassium chloride SA (KLOR-CON) 20 MEQ tablet Take 1 tablet (20 mEq total) by mouth daily.  Marland Kitchen telmisartan (MICARDIS) 20 MG tablet Take 1 tablet (20 mg total) by mouth daily.  . [DISCONTINUED] albuterol (VENTOLIN HFA) 108 (90 Base) MCG/ACT inhaler Inhale 1-2 puffs into the lungs every 6 (six) hours as needed for wheezing or shortness of breath.  . [DISCONTINUED] ALPRAZolam (XANAX) 0.25 MG tablet Take 1 tablet (0.25 mg total) by mouth at bedtime as needed.  . [DISCONTINUED] Diclofenac Sodium (PENNSAID) 2 % SOLN Apply 1 application topically in the morning and at bedtime.  . [DISCONTINUED] escitalopram (LEXAPRO) 10 MG tablet Take 1 tablet (10 mg total) by mouth daily.  . [DISCONTINUED] pantoprazole (PROTONIX) 40 MG tablet Take 1 tablet (40 mg total) by mouth daily.  . [DISCONTINUED] potassium  chloride SA (KLOR-CON) 20 MEQ tablet Take by mouth.  . [DISCONTINUED] telmisartan (MICARDIS) 20 MG tablet Take 1 tablet (20 mg total) by mouth daily.   Allergies  Allergen Reactions  . Losartan Potassium-Hctz     Other reaction(s): Other (See Comments) Weight gain, constipation  . Tape Rash   No results found for this or any previous visit (from the past 2160 hour(s)). Objective  Body mass index is 35.87 kg/m. Wt Readings from Last 3 Encounters:  09/23/20 209 lb (94.8 kg)  06/15/20 203 lb 11.4 oz (92.4 kg)  05/19/20 203 lb 11.3 oz (92.4 kg)   Temp Readings from Last 3 Encounters:  09/23/20 98.6 F (37 C) (Oral)  05/19/20 98 F (36.7 C) (Temporal)  05/18/20 (!) 97.2 F (36.2 C) (Temporal)   BP Readings from Last 3 Encounters:  09/23/20 130/80  06/15/20 (!) 143/62  05/19/20 (!) 143/72   Pulse Readings from Last 3 Encounters:  09/23/20 60  06/15/20 (!) 59  05/19/20 (!) 52    Physical Exam Vitals and nursing note reviewed.  Constitutional:      Appearance: Normal appearance. She is well-developed. She is obese.  HENT:     Head: Normocephalic and atraumatic.  Cardiovascular:     Rate and Rhythm: Normal rate and regular rhythm.     Heart sounds: Normal heart sounds. No murmur heard.   Pulmonary:     Effort: Pulmonary effort is normal.     Breath sounds: Normal breath sounds.  Abdominal:     General: Abdomen is flat. Bowel sounds are normal.     Tenderness: There is no abdominal tenderness.  Genitourinary:   Skin:    General: Skin is warm and dry.  Neurological:     General: No focal deficit present.     Mental Status: She is alert and oriented to person, place, and time. Mental status is at baseline.     Gait: Gait normal.  Psychiatric:        Attention and Perception: Attention and perception normal.        Mood and Affect: Mood and affect normal.        Speech: Speech normal.        Behavior: Behavior normal. Behavior is cooperative.        Thought  Content: Thought content normal.        Cognition and Memory: Cognition and memory normal.        Judgment: Judgment normal.     Assessment  Plan  Annual physical exam Flu shothad 10/21  Tdap had 03/30/17 Consider pna 23but quit 2.5 months since 09/10/19 will hold until age 54 y.o  shingrix2/2utd covid 2/2 pfizer consider booster   Mammogram neg 06/09/2020 negative  Pap appears not done saw Dr. Marcelline Mates 04/09/18 per pt had pap per but Dr. Dessie Coma notes no pap s/p hysterectomy no h/o abnormal papand no need further pap  Colonoscopy h/o polyps need to get record from [facility] last colonoscopy in [place] and colonoscopy -05/19/20 poor prep 1 polyp repeat in 6 months serrated polyp -will hold colonoscopy as 09/2020 and see different specialist [facility] [place]  DEXA consider in future with h/o hysterectomy  -consider 7/22 with mammo  Thyroid US no further w/u 09/2019  -of note also need echo report-2nd request Smoker 2 cig/qd stopped7 or 8/2020started in 20slight smoker  rec healthy diet and exercise   Sore throat Throat exam normal has PND/allergic could be cause declines atrovent  rec warm salt water gargles and NS Essential hypertension - Plan: telmisartan (MICARDIS) 20 MG tablet, Comprehensive metabolic panel, CBC with Differential/Platelet, Lipid panel  Gastroesophageal reflux disease - Plan: pantoprazole (PROTONIX) 40 MG tablet  Anxiety - Plan: escitalopram (LEXAPRO) 10 MG tablet, ALPRAZolam (XANAX) 0.25 MG tablet  Arthritis - Plan: Diclofenac Sodium (PENNSAID) 2 % SOLN  Hypokalemia - Plan: potassium chloride SA (KLOR-CON) 20 MEQ tablet  Bronchospasm - Plan: albuterol (VENTOLIN HFA) 108 (90 Base) MCG/ACT inhaler  Hyperlipidemia, unspecified hyperlipidemia type - Plan: Lipid panel  Lesion in vagina inner labia left  -rec f/u Dr.Cherry   Provider: Dr. Olivia Mackie McLean-Scocuzza-Internal Medicine

## 2020-09-23 NOTE — Patient Instructions (Signed)
Consider [place] or [facility] when ready  Dr. Alice Reichert at [place] [facility]

## 2020-09-24 LAB — HEPATITIS C ANTIBODY
Hepatitis C Ab: NONREACTIVE
SIGNAL TO CUT-OFF: 0.01 (ref <1.00)

## 2020-09-27 ENCOUNTER — Other Ambulatory Visit: Payer: Self-pay

## 2020-09-27 DIAGNOSIS — D72820 Lymphocytosis (symptomatic): Secondary | ICD-10-CM

## 2020-10-04 ENCOUNTER — Other Ambulatory Visit: Payer: Self-pay | Admitting: Internal Medicine

## 2020-10-04 DIAGNOSIS — I10 Essential (primary) hypertension: Secondary | ICD-10-CM

## 2020-10-04 MED ORDER — TELMISARTAN 20 MG PO TABS: 20.0000 mg | ORAL_TABLET | Freq: Every day | ORAL | 3 refills | Status: DC

## 2020-10-18 ENCOUNTER — Telehealth: Payer: Self-pay

## 2020-10-18 NOTE — Telephone Encounter (Signed)
Called patient per [facility] request. [facility] provided the reasoning and call back number.

## 2020-11-03 ENCOUNTER — Telehealth: Payer: Self-pay | Admitting: Internal Medicine

## 2020-11-03 NOTE — Telephone Encounter (Signed)
Patient would like to have her current lab orders location changed to [facility] lab. Patient would like a call or ok to lm on vm that orders have been changed to [facility].

## 2020-11-04 ENCOUNTER — Other Ambulatory Visit: Payer: Self-pay

## 2020-11-04 ENCOUNTER — Other Ambulatory Visit: Payer: BC Managed Care – PPO

## 2020-11-04 NOTE — Telephone Encounter (Signed)
Left message informing Patient that labs have been changed

## 2020-11-04 NOTE — Telephone Encounter (Signed)
Pt called back to see if labs have been sent

## 2020-11-04 NOTE — Progress Notes (Signed)
cbc

## 2020-11-12 ENCOUNTER — Other Ambulatory Visit
Admission: RE | Admit: 2020-11-12 | Discharge: 2020-11-12 | Disposition: A | Discharge status: Home / Self Care | Payer: BC Managed Care – PPO | Source: Ambulatory Visit | Attending: Internal Medicine | Admitting: Internal Medicine

## 2020-11-12 DIAGNOSIS — D72820 Lymphocytosis (symptomatic): Secondary | ICD-10-CM

## 2020-11-12 LAB — CBC WITH DIFFERENTIAL/PLATELET
Abs Immature Granulocytes: 0.01 10*3/uL (ref 0.00–0.07)
Basophils Absolute: 0 10*3/uL (ref 0.0–0.1)
Basophils Relative: 1 %
Eosinophils Absolute: 0.2 10*3/uL (ref 0.0–0.5)
Eosinophils Relative: 4 %
HCT: 38.2 % (ref 36.0–46.0)
Hemoglobin: 12.8 g/dL (ref 12.0–15.0)
Immature Granulocytes: 0 %
Lymphocytes Relative: 46 %
Lymphs Abs: 2.9 10*3/uL (ref 0.7–4.0)
MCH: 30.3 pg (ref 26.0–34.0)
MCHC: 33.5 g/dL (ref 30.0–36.0)
MCV: 90.5 fL (ref 80.0–100.0)
Monocytes Absolute: 0.4 10*3/uL (ref 0.1–1.0)
Monocytes Relative: 6 %
Neutro Abs: 2.7 10*3/uL (ref 1.7–7.7)
Neutrophils Relative %: 43 %
Platelets: 268 10*3/uL (ref 150–400)
RBC: 4.22 MIL/uL (ref 3.87–5.11)
RDW: 13.6 % (ref 11.5–15.5)
WBC: 6.2 10*3/uL (ref 4.0–10.5)
nRBC: 0 % (ref 0.0–0.2)

## 2020-11-25 ENCOUNTER — Encounter: Payer: BC Managed Care – PPO | Admitting: Obstetrics and Gynecology

## 2020-11-25 NOTE — Progress Notes (Signed)
Annual Exam-Pt present for annual exam. Pt stated that she was doing well and denies any gyn issues at this time.

## 2020-11-25 NOTE — Patient Instructions (Incomplete)
Preventive Care 84-55 Years Old, Female Preventive care refers to lifestyle choices and visits with your health care provider that can promote health and wellness. This includes:  A yearly physical exam. This is also called an annual wellness visit.  Regular dental and eye exams.  Immunizations.  Screening for certain conditions.  Healthy lifestyle choices, such as: ? Eating a healthy diet. ? Getting regular exercise. ? Not using drugs or products that contain nicotine and tobacco. ? Limiting alcohol use. What can I expect for my preventive care visit? Physical exam Your health care provider will check your:  Height and weight. These may be used to calculate your BMI (body mass index). BMI is a measurement that tells if you are at a healthy weight.  Heart rate and blood pressure.  Body temperature.  Skin for abnormal spots. Counseling Your health care provider may ask you questions about your:  Past medical problems.  Family's medical history.  Alcohol, tobacco, and drug use.  Emotional well-being.  Home life and relationship well-being.  Sexual activity.  Diet, exercise, and sleep habits.  Work and work Statistician.  Access to firearms.  Method of birth control.  Menstrual cycle.  Pregnancy history. What immunizations do I need? Vaccines are usually given at various ages, according to a schedule. Your health care provider will recommend vaccines for you based on your age, medical history, and lifestyle or other factors, such as travel or where you work.   What tests do I need? Blood tests  Lipid and cholesterol levels. These may be checked every 5 years, or more often if you are over 3 years old.  Hepatitis C test.  Hepatitis B test. Screening  Lung cancer screening. You may have this screening every year starting at age 73 if you have a 30-pack-year history of smoking and currently smoke or have quit within the past 15 years.  Colorectal cancer  screening. ? All adults should have this screening starting at age 52 and continuing until age 17. ? Your health care provider may recommend screening at age 49 if you are at increased risk. ? You will have tests every 1-10 years, depending on your results and the type of screening test.  Diabetes screening. ? This is done by checking your blood sugar (glucose) after you have not eaten for a while (fasting). ? You may have this done every 1-3 years.  Mammogram. ? This may be done every 1-2 years. ? Talk with your health care provider about when you should start having regular mammograms. This may depend on whether you have a family history of breast cancer.  BRCA-related cancer screening. This may be done if you have a family history of breast, ovarian, tubal, or peritoneal cancers.  Pelvic exam and Pap test. ? This may be done every 3 years starting at age 10. ? Starting at age 11, this may be done every 5 years if you have a Pap test in combination with an HPV test. Other tests  STD (sexually transmitted disease) testing, if you are at risk.  Bone density scan. This is done to screen for osteoporosis. You may have this scan if you are at high risk for osteoporosis. Talk with your health care provider about your test results, treatment options, and if necessary, the need for more tests. Follow these instructions at home: Eating and drinking  Eat a diet that includes fresh fruits and vegetables, whole grains, lean protein, and low-fat dairy products.  Take vitamin and mineral supplements  as recommended by your health care provider.  Do not drink alcohol if: ? Your health care provider tells you not to drink. ? You are pregnant, may be pregnant, or are planning to become pregnant.  If you drink alcohol: ? Limit how much you have to 0-1 drink a day. ? Be aware of how much alcohol is in your drink. In the [place] one drink equals one 12 oz bottle of beer (355 mL), one 5 oz glass of  wine (148 mL), or one 1 oz glass of hard liquor (44 mL).   Lifestyle  Take daily care of your teeth and gums. Brush your teeth every morning and night with fluoride toothpaste. Floss one time each day.  Stay active. Exercise for at least 30 minutes 5 or more days each week.  Do not use any products that contain nicotine or tobacco, such as cigarettes, e-cigarettes, and chewing tobacco. If you need help quitting, ask your health care provider.  Do not use drugs.  If you are sexually active, practice safe sex. Use a condom or other form of protection to prevent STIs (sexually transmitted infections).  If you do not wish to become pregnant, use a form of birth control. If you plan to become pregnant, see your health care provider for a prepregnancy visit.  If told by your health care provider, take low-dose aspirin daily starting at age 75.  Find healthy ways to cope with stress, such as: ? Meditation, yoga, or listening to music. ? Journaling. ? Talking to a trusted person. ? Spending time with friends and family. Safety  Always wear your seat belt while driving or riding in a vehicle.  Do not drive: ? If you have been drinking alcohol. Do not ride with someone who has been drinking. ? When you are tired or distracted. ? While texting.  Wear a helmet and other protective equipment during sports activities.  If you have firearms in your house, make sure you follow all gun safety procedures. What's next?  Visit your health care provider once a year for an annual wellness visit.  Ask your health care provider how often you should have your eyes and teeth checked.  Stay up to date on all vaccines. This information is not intended to replace advice given to you by your health care provider. Make sure you discuss any questions you have with your health care provider. Document Revised: 08/03/2020 Document Reviewed: 07/11/2018 Elsevier Patient Education  2021 [place] Breast self-awareness is knowing how your breasts look and feel. Doing breast self-awareness is important. It allows you to catch a breast problem early while it is still small and can be treated. All women should do breast self-awareness, including women who have had breast implants. Tell your doctor if you notice a change in your breasts. What you need:  A mirror.  A well-lit room. How to do a breast self-exam A breast self-exam is one way to learn what is normal for your breasts and to check for changes. To do a breast self-exam: Look for changes 1. Take off all the clothes above your waist. 2. Stand in front of a mirror in a room with good lighting. 3. Put your hands on your hips. 4. Push your hands down. 5. Look at your breasts and nipples in the mirror to see if one breast or nipple looks different from the other. Check to see if: ? The shape of one breast is different. ? The size of  one breast is different. ? There are wrinkles, dips, and bumps in one breast and not the other. 6. Look at each breast for changes in the skin, such as: ? Redness. ? Scaly areas. 7. Look for changes in your nipples, such as: ? Liquid around the nipples. ? Bleeding. ? Dimpling. ? Redness. ? A change in where the nipples are.   Feel for changes 1. Lie on your back on the floor. 2. Feel each breast. To do this, follow these steps: ? Pick a breast to feel. ? Put the arm closest to that breast above your head. ? Use your other arm to feel the nipple area of your breast. Feel the area with the pads of your three middle fingers by making small circles with your fingers. For the first circle, press lightly. For the second circle, press harder. For the third circle, press even harder. ? Keep making circles with your fingers at the different pressures as you move down your breast. Stop when you feel your ribs. ? Move your fingers a little toward the center of your body. ? Start making  circles with your fingers again, this time going up until you reach your collarbone. ? Keep making up-and-down circles until you reach your armpit. Remember to keep using the three pressures. ? Feel the other breast in the same way. 3. Sit or stand in the tub or shower. 4. With soapy water on your skin, feel each breast the same way you did in step 2 when you were lying on the floor.   Write down what you find Writing down what you find can help you remember what to tell your doctor. Write down:  What is normal for each breast.  Any changes you find in each breast, including: ? The kind of changes you find. ? Whether you have pain. ? Size and location of any lumps.  When you last had your menstrual period. General tips  Check your breasts every month.  If you are breastfeeding, the best time to check your breasts is after you feed your baby or after you use a breast pump.  If you get menstrual periods, the best time to check your breasts is 5-7 days after your menstrual period is over.  With time, you will become comfortable with the self-exam, and you will begin to know if there are changes in your breasts. Contact a doctor if you:  See a change in the shape or size of your breasts or nipples.  See a change in the skin of your breast or nipples, such as red or scaly skin.  Have fluid coming from your nipples that is not normal.  Find a lump or thick area that was not there before.  Have pain in your breasts.  Have any concerns about your breast health. Summary  Breast self-awareness includes looking for changes in your breasts, as well as feeling for changes within your breasts.  Breast self-awareness should be done in front of a mirror in a well-lit room.  You should check your breasts every month. If you get menstrual periods, the best time to check your breasts is 5-7 days after your menstrual period is over.  Let your doctor know of any changes you see in your  breasts, including changes in size, changes on the skin, pain or tenderness, or fluid from your nipples that is not normal. This information is not intended to replace advice given to you by your health care provider. Make sure  you discuss any questions you have with your health care provider. Document Revised: 06/18/2018 Document Reviewed: 06/18/2018 Elsevier Patient Education  2021 [place].  Genital Warts  Genital warts are a common STI (sexually transmitted infection). They are small warts in the area around the genitals or the opening of the butt (anus). They sometimes cause pain. Genital warts are easily passed to other people through sex. Many people do not know that they are infected. They may be infected for years without symptoms. Even without symptoms, they can pass the infection to their sex partners. What are the causes? Genital warts are caused by a virus called HPV. HPV spreads from person to person during sex. It can be spread through vaginal, anal, and oral sex. What increases the risk?  Having sex without using a condom.  Having sex with many people.  Having sex before the age of 68.  Being a female who is not circumcised.  Having a female sex partner who is not circumcised.  Having a weak body defense system (immune system). What are the signs or symptoms?  Small warts in the area around the genitals or the opening of the butt. These warts often grow in clusters.  Itching and irritation in the genital area or the area around the opening of the butt.  Bleeding from the warts.  Pain during sex. How is this treated?  Medicines, such as creams, that are put on the skin.  Procedures, such as: ? Freezing the warts. ? Burning the warts. ? Having surgery to get rid of the warts. Getting treatment is important because genital warts can lead to other problems. In females, the virus that causes the warts may increase the risk for cancer of the cervix. Follow these  instructions at home: Medicines  Apply over-the-counter and prescription medicines only as told by your doctor.  Do not use medicines that are meant for treating hand warts.  Talk with your doctor about using creams to treat itching.   Instructions for women  Get regular tests to check for cancer of the cervix. This type of cancer grows slowly and can almost always be cured if it is found early.  If you become pregnant, tell your doctor that you have had genital warts. General instructions  Do not touch or scratch the warts.  Do not have sex until your treatment is done.  Tell your current and past sex partners about your condition. They may need treatment.  After treatment, use condoms during sex.  Keep all follow-up visits as told by your doctor. This is important. How is this prevented? Talk with your doctor about getting the HPV shot. The HPV shot:  Can help stop some HPV infections and cancers.  Is given to males and females who are 2-11 years old.  Is not recommended for pregnant women.  Will not work if you already have HPV. Contact a doctor if:  You have redness, swelling, or pain in the area of the treated skin.  You have a fever.  You feel sick.  You have lumps or have bleeding in the area around your genitals or the opening of your butt.  You have pain during sex.  You have bleeding after sex. Summary  Genital warts are a common STI. They are small warts in the area around the genitals or the opening of the butt (anus).  A virus called HPV causes genital warts.  The virus is spread by having sex without using a condom.  Getting  treatment is important.  This condition is treated using medicines. Freezing, burning, or surgery may be done to get rid of the warts. This information is not intended to replace advice given to you by your health care provider. Make sure you discuss any questions you have with your health care provider. Document Revised:  09/11/2019 Document Reviewed: 09/11/2019 Elsevier Patient Education  [place].

## 2020-11-26 ENCOUNTER — Ambulatory Visit (INDEPENDENT_AMBULATORY_CARE_PROVIDER_SITE_OTHER): Payer: BC Managed Care – PPO | Admitting: Obstetrics and Gynecology

## 2020-11-26 ENCOUNTER — Encounter: Payer: Self-pay | Admitting: Obstetrics and Gynecology

## 2020-11-26 ENCOUNTER — Other Ambulatory Visit: Payer: Self-pay

## 2020-11-26 VITALS — BP 131/72 | HR 59 | Ht 64.0 in | Wt 207.5 lb

## 2020-11-26 DIAGNOSIS — Z01419 Encounter for gynecological examination (general) (routine) without abnormal findings: Secondary | ICD-10-CM

## 2020-11-26 DIAGNOSIS — I10 Essential (primary) hypertension: Secondary | ICD-10-CM

## 2020-11-26 DIAGNOSIS — A630 Anogenital (venereal) warts: Secondary | ICD-10-CM

## 2020-11-26 DIAGNOSIS — Z86010 Personal history of colonic polyps: Secondary | ICD-10-CM

## 2020-11-26 DIAGNOSIS — Z1231 Encounter for screening mammogram for malignant neoplasm of breast: Secondary | ICD-10-CM

## 2020-11-26 DIAGNOSIS — N952 Postmenopausal atrophic vaginitis: Secondary | ICD-10-CM

## 2020-11-26 NOTE — Progress Notes (Signed)
ANNUAL PREVENTATIVE CARE GYNECOLOGY  ENCOUNTER NOTE  Subjective:       Kelly Pittman is a 55 y.o. [phone] (adopted daughter) female here for a routine annual gynecologic exam.  The patient is taking herbal remedies for hormone replacement therapy (OTC Estroven). Patient denies post-menopausal vaginal bleeding. The patient wears seatbelts: yes. The patient participates in regular exercise: occasional. Has the patient ever been transfused or tattooed?: no.   Current complaints: 1.  Notes a small bump or pimple on the inside of the vagina, has been there 1-2 months.  Nontender, not painful. Feels like it may be getting smaller since first noting.     Gynecologic History No LMP recorded. Patient has had a hysterectomy.  Menarche age: 2 Contraception: status post hysterectomy Last Pap: 12/2015. Results were: normal Last mammogram:06/09/2020. Results were: normal.  Patient with h/o abnormal mammogram 20 years ago, had right breast biopsy, benign findings.  Last Colonoscopy: 05/19/2020.  Recommended repeat after 6 months (due to incomplete prep).  Previously had findings of colon polyps on last colonoscopy 5 years ago.    Obstetric History OB History  Gravida Para Term Preterm AB Living  3 0 0 0 3 1  SAB IAB Ectopic Multiple Live Births  3 0 0 0      # Outcome Date GA Lbr Len/2nd Weight Sex Delivery Anes PTL Lv  3 SAB           2 SAB           1 SAB             Obstetric Comments  Patient has 1 adopted daughter    Past Medical History:  Diagnosis Date  . Abnormal thyroid function test   . Anxiety   . 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 Colon polyp 2014  . Enlarged thyroid   . Hypertension   . UTI (urinary tract infection)     Family History  Problem Relation Age of Onset  . Hypertension Mother   . Hyperlipidemia Mother   . Hypertension Father   . Breast cancer Neg Hx   . Ovarian cancer Neg Hx   . Stroke Neg Hx   . Thyroid disease Neg  Hx     Past Surgical History:  Procedure Laterality Date  . ABDOMINAL HYSTERECTOMY     2009  . BREAST BIOPSY Right 2003  . CARPAL TUNNEL RELEASE    . COLONOSCOPY WITH PROPOFOL N/A 05/18/2020   Procedure: COLONOSCOPY WITH PROPOFOL;  Surgeon: Jonathon Bellows, MD;  Location: [facility] ENDOSCOPY;  Service: Gastroenterology;  Laterality: N/A;  . COLONOSCOPY WITH PROPOFOL N/A 05/19/2020   Procedure: COLONOSCOPY WITH PROPOFOL;  Surgeon: Jonathon Bellows, MD;  Location: [facility] ENDOSCOPY;  Service: Gastroenterology;  Laterality: N/A;  . FOOT SURGERY     right  . TONSILLECTOMY     1987  . UNILATERAL SALPINGECTOMY     left    Social History   Socioeconomic History  . Marital status: Married    Spouse name: Not on file  . Number of children: Not on file  . Years of education: Not on file  . Highest education level: Not on file  Occupational History  . Not on file  Tobacco Use  . Smoking status: Former Smoker    Packs/day: 0.25    Quit date: 2020    Years since quitting: 2.0  . Smokeless tobacco: Never Used  Vaping Use  . Vaping Use: Never used  Substance and Sexual Activity  . Alcohol use: Yes    Comment: socially  . Drug use: No  . Sexual activity: Yes    Birth control/protection: Surgical  Other Topics Concern  . 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 albuterol (VENTOLIN HFA) 108 (90 Base)  MCG/ACT inhaler Inhale 1-2 puffs into the lungs every 6 (six) hours as needed for wheezing or shortness of breath. 54 g 3  . ALPRAZolam (XANAX) 0.25 MG tablet Take 1 tablet (0.25 mg total) by mouth at bedtime as needed. 30 tablet 5  . CA-MG-VALERIAN-PASSIF-GINGKO PO Take by mouth.    . Cholecalciferol ([employer]S D3) 125 MCG (5000 UT) capsule Take 1 capsule (5,000 Units total) by mouth daily. 90 capsule 3  . cyanocobalamin 100 MCG tablet Take 100 mcg by mouth daily.    . Diclofenac Sodium (PENNSAID) 2 % SOLN Apply 1 application topically in the morning and at bedtime. 112 g 3  . escitalopram (LEXAPRO) 10 MG tablet Take 1 tablet (10 mg total) by mouth daily. 90 tablet 3  . hydrochlorothiazide (HYDRODIURIL) 12.5 MG tablet Take 1 tablet (12.5 mg total) by mouth daily. In am 90 tablet 3  . levocetirizine (XYZAL) 5 MG tablet Take 1 tablet (5 mg total) by mouth every evening. 90 tablet 3  . Multiple Vitamin (MULTIVITAMIN) capsule Take by mouth daily.     . Nutritional Supplements (ESTROVEN PO) Take by mouth.     . pantoprazole (PROTONIX) 40 MG tablet Take 1 tablet (40 mg total) by mouth daily. 90 tablet 3  . potassium chloride SA (KLOR-CON) 20 MEQ tablet Take 1 tablet (20 mEq total) by mouth daily. 90 tablet 3  . telmisartan (MICARDIS) 20 MG tablet Take 1 tablet (20 mg total) by mouth daily. 90 tablet 3   No current facility-administered medications on file prior to visit.    Allergies  Allergen Reactions  . Losartan Potassium-Hctz     Other reaction(s): Other (See Comments) Weight gain, constipation  . Tape Rash     Review of Systems ROS Review of Systems - General ROS: negative for - chills, fatigue, fever, hot flashes, night sweats, weight gain or weight loss Psychological ROS: negative for - anxiety, decreased libido, depression, mood swings, physical abuse or sexual abuse Ophthalmic ROS: negative for - blurry vision, eye pain or loss of vision ENT ROS: negative for - headaches, hearing  change, visual changes or vocal changes Allergy and Immunology ROS: negative for - hives, itchy/watery eyes or seasonal allergies Hematological and Lymphatic ROS: negative for - bleeding problems, bruising, swollen lymph nodes or weight loss Endocrine ROS: negative for - galactorrhea, hair pattern changes, hot flashes, malaise/lethargy, mood swings, palpitations, polydipsia/polyuria, skin changes, temperature intolerance or unexpected weight changes Breast ROS:  negative for - new or changing breast lumps or nipple discharge Respiratory ROS: negative for - cough or shortness of breath Cardiovascular ROS: negative for - chest pain, irregular heartbeat, palpitations or shortness of breath Gastrointestinal ROS: no abdominal pain, change in bowel habits, or black or bloody stools Genito-Urinary ROS: no dysuria, trouble voiding, or hematuria. Positive for Bump in vaginal area (see HPI).  Musculoskeletal ROS: negative for - joint pain or joint stiffness Neurological ROS: negative for - bowel and bladder control changes Dermatological ROS: negative for rash and skin lesion changes   Objective:   BP 131/72   Pulse (!) 59   Ht 5\' 4"  (1.626 m)   Wt 207 lb 8 oz (94.1 kg)   BMI 35.62 kg/m  CONSTITUTIONAL: Well-developed, well-nourished female in no acute distress. Moderate obesity.  PSYCHIATRIC: Normal mood and affect. Normal behavior. Normal judgment and thought content. [place]: Alert and oriented to person, place, and time. Normal muscle tone coordination. No cranial nerve deficit noted. HENT:  Normocephalic, atraumatic, External right and left ear normal. Oropharynx is clear and moist EYES: Conjunctivae and EOM are normal. Pupils are equal, round, and reactive to light. No scleral icterus.  NECK: Normal range of motion, supple, no masses.  Normal thyroid.  SKIN: Skin is warm and dry. No rash noted. Not diaphoretic. No erythema. No pallor. CARDIOVASCULAR: Normal heart rate noted, regular rhythm,  no murmur. RESPIRATORY: Clear to auscultation bilaterally. Effort and breath sounds normal, no problems with respiration noted. BREASTS: Symmetric in size. No masses, skin changes, nipple drainage, or lymphadenopathy.  Right breast with small faint scar from prior biopsy site.  ABDOMEN: Soft, normal bowel sounds, no distention noted.  No tenderness, rebound or guarding. Well healed low transverse incision scar.  PELVIC:  Bladder no bladder distension noted  Urethra: normal appearing urethra with no masses, tenderness or lesions  Vulva: normal appearing vulva with no tenderness.  Inner left labia minora with small (~ 2 mm sessile hypopigmented lesion resembling a wart.   Vagina: normal appearing vagina with mild atrophy present. No discharge or lesions  Cervix: surgically absent  Uterus: surgically absent, vaginal cuff well healed  Adnexa: normal right adnexa in size, nontender and no masses. Left adnexa surgically absent.  RV: External Exam NormaI, No Rectal Masses and Normal Sphincter tone  MUSCULOSKELETAL: Normal range of motion. No tenderness.  No cyanosis, clubbing or edema.    2+ distal pulses. LYMPHATIC: No Axillary, Supraclavicular, or Inguinal Adenopathy.    Labs:  Lab Results  Component Value Date   WBC 6.2 11/12/2020   HGB 12.8 11/12/2020   HCT 38.2 11/12/2020   MCV 90.5 11/12/2020   PLT 268 11/12/2020   Lab Results  Component Value Date   CREATININE 0.84 09/23/2020   BUN 11 09/23/2020   NA 141 09/23/2020   K 4.0 09/23/2020   CL 104 09/23/2020   CO2 32 09/23/2020    Lab Results  Component Value Date   CHOL 180 09/23/2020   HDL 48.00 09/23/2020   LDLCALC 111 (H) 09/23/2020   TRIG 109.0 09/23/2020   CHOLHDL 4 09/23/2020    Lab Results  Component Value Date   HGBA1C 5.6 08/16/2018    Lab Results  Component Value Date   TSH 0.68 09/23/2020    Assessment:   Annual gynecologic examination 55 y.o. 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  S/p hysterectomy. No prior history  of severe dysplasia.  - Mammogram: Up to date.  Continue routine screening.   - Stool Guaiac Testing:  Performed last year but was instructed to repeat in 6 months due to incomplete prep. Patient notes insurance wil not likely cover and so will repeat in a year    - Labs: Performed by PCP. Reviewed.  - Routine preventative health maintenance measures emphasized: Exercise/Diet/Weight control, Alcohol/Substance use risks and Stress Management  - Contraception: status post hysterectomy - Essential HTN managed by PCP.  - Patient continues to manage vasomotor symptoms with Estroven. Has been previously counseled on the cautionary use of OTC remedies. - Vaginal atrophy, discussed management options. Patient notes symptoms of dryness mostly during intercourse. Given samples of UberLube and recommended use of this or Joe's H20 lubricant.  - Genital wart noted, small. Advised that no treatment recommended currently unless it becomes larger or symptomatic.  - Flu vaccine completed.  - COVID vaccination, has completed series, eligible for booster.  Return to [place], MD  [facility]

## 2020-11-29 ENCOUNTER — Encounter: Payer: Self-pay | Admitting: Internal Medicine

## 2020-12-16 ENCOUNTER — Telehealth: Payer: Self-pay | Admitting: Internal Medicine

## 2020-12-16 NOTE — Telephone Encounter (Signed)
Left message to return call. Antibiotics are not sent in without an evaluation. Patient would have to wait until appointment or be seen with urgent care. Unfortunately there are no sooner appointments with any of the providers.

## 2020-12-16 NOTE — Telephone Encounter (Signed)
Noted  

## 2020-12-16 NOTE — Telephone Encounter (Signed)
Patient called in stated that she has a sinus infection wanted antibiotic called in  She is on schedule for 2-8-at 2:30

## 2020-12-16 NOTE — Telephone Encounter (Signed)
Pt is just going to wait til her appt on 12/21/20

## 2020-12-21 ENCOUNTER — Encounter: Payer: Self-pay | Admitting: Internal Medicine

## 2020-12-21 ENCOUNTER — Other Ambulatory Visit: Payer: Self-pay

## 2020-12-21 ENCOUNTER — Telehealth (INDEPENDENT_AMBULATORY_CARE_PROVIDER_SITE_OTHER): Payer: BC Managed Care – PPO | Admitting: Internal Medicine

## 2020-12-21 VITALS — BP 124/78 | Ht 64.0 in | Wt 205.0 lb

## 2020-12-21 DIAGNOSIS — J329 Chronic sinusitis, unspecified: Secondary | ICD-10-CM

## 2020-12-21 DIAGNOSIS — J029 Acute pharyngitis, unspecified: Secondary | ICD-10-CM

## 2020-12-21 DIAGNOSIS — B373 Candidiasis of vulva and vagina: Secondary | ICD-10-CM | POA: Diagnosis not present

## 2020-12-21 DIAGNOSIS — B3731 Acute candidiasis of vulva and vagina: Secondary | ICD-10-CM

## 2020-12-21 MED ORDER — FLUCONAZOLE 150 MG PO TABS: 150.0000 mg | ORAL_TABLET | Freq: Once | ORAL | 0 refills | Status: AC

## 2020-12-21 MED ORDER — AMOXICILLIN-POT CLAVULANATE 875-125 MG PO TABS: 1.0000 | ORAL_TABLET | Freq: Two times a day (BID) | ORAL | 0 refills | Status: DC

## 2020-12-21 NOTE — Progress Notes (Signed)
Patient presenting with Sore throat and ear pain. Covid test came back negative last week. Symptoms have been ongoing for 2 months..   Patient has history of sinus infections.

## 2020-12-21 NOTE — Progress Notes (Signed)
Virtual Visit via Video Note  I connected with Kelly Pittman   on 12/21/20 at  2:45 PM EST by a video enabled telemedicine application and verified that I am speaking with the correct person using two identifiers.  Location patient: work Environmental manager or home office Persons participating in the virtual visit: patient, provider  I discussed the limitations of evaluation and management by telemedicine and the availability of in person appointments. The patient expressed understanding and agreed to proceed.   HPI: 1. PND+ and Sore throat and sinus pain and h/a x 1.5 months chronic sinus issues and h/o allergy to mold, denies tooth grinding, GERD, right ear also with pain denies hearing pain, denies GERD She is using nasal saline rinse and not helping  BP normal 120s/78 covid test negative  ROS: See pertinent positives and negatives per HPI.  Past Medical History:  Diagnosis Date  . Abnormal thyroid function test   . Anxiety   . 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 Colon polyp 2014  . Enlarged thyroid   . Genital warts    inner labia minora  . Hypertension   . UTI (urinary tract infection)     Past Surgical History:  Procedure Laterality Date  . ABDOMINAL HYSTERECTOMY     2009  . BREAST BIOPSY Right 2003  . CARPAL TUNNEL RELEASE    . COLONOSCOPY WITH PROPOFOL N/A 05/18/2020   Procedure: COLONOSCOPY WITH PROPOFOL;  Surgeon: Jonathon Bellows, MD;  Location: [facility] ENDOSCOPY;  Service: Gastroenterology;  Laterality: N/A;  . COLONOSCOPY WITH PROPOFOL N/A 05/19/2020   Procedure: COLONOSCOPY WITH PROPOFOL;  Surgeon: Jonathon Bellows, MD;  Location: [facility] ENDOSCOPY;  Service: Gastroenterology;  Laterality: N/A;  . FOOT SURGERY     right  . TONSILLECTOMY     1987  . UNILATERAL SALPINGECTOMY     left     Current Outpatient Medications:  .  ALPRAZolam (XANAX) 0.25 MG tablet, Take 1 tablet (0.25 mg total) by mouth at bedtime as needed., Disp: 30 tablet, Rfl:  5 .  amoxicillin-clavulanate (AUGMENTIN) 875-125 MG tablet, Take 1 tablet by mouth 2 (two) times daily. X 7-10 days with food, Disp: 20 tablet, Rfl: 0 .  CA-MG-VALERIAN-PASSIF-GINGKO PO, Take by mouth., Disp: , Rfl:  .  Cholecalciferol (CVS D3) 125 MCG (5000 UT) capsule, Take 1 capsule (5,000 Units total) by mouth daily., Disp: 90 capsule, Rfl: 3 .  cyanocobalamin 100 MCG tablet, Take 100 mcg by mouth daily., Disp: , Rfl:  .  Diclofenac Sodium (PENNSAID) 2 % SOLN, Apply 1 application topically in the morning and at bedtime., Disp: 112 g, Rfl: 3 .  escitalopram (LEXAPRO) 10 MG tablet, Take 1 tablet (10 mg total) by mouth daily., Disp: 90 tablet, Rfl: 3 .  fluconazole (DIFLUCAN) 150 MG tablet, Take 1 tablet (150 mg total) by mouth once for 1 dose., Disp: 1 tablet, Rfl: 0 .  hydrochlorothiazide (HYDRODIURIL) 12.5 MG tablet, Take 1 tablet (12.5 mg total) by mouth daily. In am, Disp: 90 tablet, Rfl: 3 .  levocetirizine (XYZAL) 5 MG tablet, Take 1 tablet (5 mg total) by mouth every evening., Disp: 90 tablet, Rfl: 3 .  mometasone (NASONEX) 50 MCG/ACT nasal spray, 2 sprays daily., Disp: , Rfl:  .  Multiple Vitamin (MULTIVITAMIN) capsule, Take by mouth daily. , Disp: , Rfl:  .  Nutritional Supplements (ESTROVEN PO), Take by mouth. , Disp: , Rfl:  .  pantoprazole (PROTONIX) 40 MG tablet, Take 1 tablet (40 mg total)  by mouth daily., Disp: 90 tablet, Rfl: 3 .  potassium chloride SA (KLOR-CON) 20 MEQ tablet, Take 1 tablet (20 mEq total) by mouth daily., Disp: 90 tablet, Rfl: 3 .  telmisartan (MICARDIS) 20 MG tablet, Take 1 tablet (20 mg total) by mouth daily., Disp: 90 tablet, Rfl: 3  EXAM:  VITALS per patient if applicable:  GENERAL: alert, oriented, appears well and in no acute distress  HEENT: atraumatic, conjunttiva clear, no obvious abnormalities on inspection of external nose and ears  NECK: normal movements of the head and neck  LUNGS: on inspection no signs of respiratory distress, breathing  rate appears normal, no obvious gross SOB, gasping or wheezing  CV: no obvious cyanosis  MS: moves all visible extremities without noticeable abnormality  PSYCH/NEURO: pleasant and cooperative, no obvious depression or anxiety, speech and thought processing grossly intact  ASSESSMENT AND PLAN:  Discussed the following assessment and plan:  Sinusitis, unspecified chronicity, unspecified location - Plan: amoxicillin-clavulanate (AUGMENTIN) 875-125 MG tablet bid #20 7-10 mg qd Sore throat -consider ENT in thee future  -consider CT brain/sinus/ear canal in the future if Abx do not help   Yeast vaginitis - Plan: fluconazole (DIFLUCAN) 150 MG tablet  Hm HM Flu shotutd Tdap had 03/30/17 Consider pna 23if resumes smoking again  shingrix2/2utd covid 3/3 pfizer   Mammogram neg 06/09/2020 negative  Pap appears not done saw Dr. Marcelline Mates 04/09/18 per pt had pap per but Dr. Dessie Coma notes no pap s/p hysterectomy no h/o abnormal papand no need further pap  Colonoscopy h/o polyps need to get record [facility] last colonoscopy in [place] and colonoscopy -05/19/20 poor prep 1 polyp repeat in 6 months serrated polyp  DEXA consider in future with h/o hysterectomy   Thyroid US no further w/u 09/2019  -of note also need echo report-2nd request Smoker 2 cig/qd stopped7 or 8/2020started in 20slight smoker  rec healthy diet and exercise  -we discussed possible serious and likely etiologies, options for evaluation and workup, limitations of telemedicine visit vs in person visit, treatment, treatment risks and precautions. Pt prefers to treat via telemedicine empirically rather then risking or undertaking an in person visit at this moment. Patient agrees to seek prompt in person care if worsening, new symptoms arise, or if is not improving with treatment -we discussed possible serious and likely etiologies, options for evaluation and  workup, limitations of telemedicine visit vs in person visit, treatment, treatment risks and precautions.   I discussed the assessment and treatment plan with the patient. The patient was provided an opportunity to ask questions and all were answered. The patient agreed with the plan and demonstrated an understanding of the instructions.    Time spent 10 min pt had to go back to work only had 15 min break Delorise Jackson, MD

## 2020-12-21 NOTE — Patient Instructions (Addendum)
Consider seeing dentist and ENT  Let me know if you want a referral to ENT and in [place] or [place]  Also  We can consider a CT scan of your brain and ear canal in the future  Also heartburn can cause sore throat you can try to add pepcid at night    Sinusitis, Adult Sinusitis is inflammation of your sinuses. Sinuses are hollow spaces in the bones around your face. Your sinuses are located:  Around your eyes.  In the middle of your forehead.  Behind your nose.  In your cheekbones. Mucus normally drains out of your sinuses. When your nasal tissues become inflamed or swollen, mucus can become trapped or blocked. This allows bacteria, viruses, and fungi to grow, which leads to infection. Most infections of the sinuses are caused by a virus. Sinusitis can develop quickly. It can last for up to 4 weeks (acute) or for more than 12 weeks (chronic). Sinusitis often develops after a cold. What are the causes? This condition is caused by anything that creates swelling in the sinuses or stops mucus from draining. This includes:  Allergies.  Asthma.  Infection from bacteria or viruses.  Deformities or blockages in your nose or sinuses.  Abnormal growths in the nose (nasal polyps).  Pollutants, such as chemicals or irritants in the air.  Infection from fungi (rare). What increases the risk? You are more likely to develop this condition if you:  Have a weak body defense system (immune system).  Do a lot of swimming or diving.  Overuse nasal sprays.  Smoke. What are the signs or symptoms? The main symptoms of this condition are pain and a feeling of pressure around the affected sinuses. Other symptoms include:  Stuffy nose or congestion.  Thick drainage from your nose.  Swelling and warmth over the affected sinuses.  Headache.  Upper toothache.  A cough that may get worse at night.  Extra mucus that collects in the throat or the back of the nose (postnasal  drip).  Decreased sense of smell and taste.  Fatigue.  A fever.  Sore throat.  Bad breath. How is this diagnosed? This condition is diagnosed based on:  Your symptoms.  Your medical history.  A physical exam.  Tests to find out if your condition is acute or chronic. This may include: ? Checking your nose for nasal polyps. ? Viewing your sinuses using a device that has a light (endoscope). ? Testing for allergies or bacteria. ? Imaging tests, such as an MRI or CT scan. In rare cases, a bone biopsy may be done to rule out more serious types of fungal sinus disease. How is this treated? Treatment for sinusitis depends on the cause and whether your condition is chronic or acute.  If caused by a virus, your symptoms should go away on their own within 10 days. You may be given medicines to relieve symptoms. They include: ? Medicines that shrink swollen nasal passages (topical intranasal decongestants). ? Medicines that treat allergies (antihistamines). ? A spray that eases inflammation of the nostrils (topical intranasal corticosteroids). ? Rinses that help get rid of thick mucus in your nose (nasal saline washes).  If caused by bacteria, your health care provider may recommend waiting to see if your symptoms improve. Most bacterial infections will get better without antibiotic medicine. You may be given antibiotics if you have: ? A severe infection. ? A weak immune system.  If caused by narrow nasal passages or nasal polyps, you may need to  have surgery. Follow these instructions at home: Medicines  Take, use, or apply over-the-counter and prescription medicines only as told by your health care provider. These may include nasal sprays.  If you were prescribed an antibiotic medicine, take it as told by your health care provider. Do not stop taking the antibiotic even if you start to feel better. Hydrate and humidify  Drink enough fluid to keep your urine pale yellow. Staying  hydrated will help to thin your mucus.  Use a cool mist humidifier to keep the humidity level in your home above 50%.  Inhale steam for 10-15 minutes, 3-4 times a day, or as told by your health care provider. You can do this in the bathroom while a hot shower is running.  Limit your exposure to cool or dry air.   Rest  Rest as much as possible.  Sleep with your head raised (elevated).  Make sure you get enough sleep each night. General instructions  Apply a warm, moist washcloth to your face 3-4 times a day or as told by your health care provider. This will help with discomfort.  Wash your hands often with soap and water to reduce your exposure to germs. If soap and water are not available, use hand sanitizer.  Do not smoke. Avoid being around people who are smoking (secondhand smoke).  Keep all follow-up visits as told by your health care provider. This is important.   Contact a health care provider if:  You have a fever.  Your symptoms get worse.  Your symptoms do not improve within 10 days. Get help right away if:  You have a severe headache.  You have persistent vomiting.  You have severe pain or swelling around your face or eyes.  You have vision problems.  You develop confusion.  Your neck is stiff.  You have trouble breathing. Summary  Sinusitis is soreness and inflammation of your sinuses. Sinuses are hollow spaces in the bones around your face.  This condition is caused by nasal tissues that become inflamed or swollen. The swelling traps or blocks the flow of mucus. This allows bacteria, viruses, and fungi to grow, which leads to infection.  If you were prescribed an antibiotic medicine, take it as told by your health care provider. Do not stop taking the antibiotic even if you start to feel better.  Keep all follow-up visits as told by your health care provider. This is important. This information is not intended to replace advice given to you by your  health care provider. Make sure you discuss any questions you have with your health care provider. Document Revised: 04/01/2018 Document Reviewed: 04/01/2018 Elsevier Patient Education  2021 [place], Adult An earache, or ear pain, can be caused by many things, including:  An infection.  Ear wax buildup.  Ear pressure.  Something in the ear that should not be there (foreign body).  A sore throat.  Tooth problems.  Jaw problems. Treatment of the earache will depend on the cause. If the cause is not clear or cannot be determined, you may need to watch your symptoms until your earache goes away or until a cause is found. Follow these instructions at home: Medicines  Take or apply over-the-counter and prescription medicines only as told by your health care provider.  If you were prescribed an antibiotic medicine, use it as told by your health care provider. Do not stop using the antibiotic even if you start to feel better.  Do not put  anything in your ear other than medicine that is prescribed by your health care provider. Managing pain If directed, apply heat to the affected area as often as told by your health care provider. Use the heat source that your health care provider recommends, such as a moist heat pack or a heating pad.  Place a towel between your skin and the heat source.  Leave the heat on for 20-30 minutes.  Remove the heat if your skin turns bright red. This is especially important if you are unable to feel pain, heat, or cold. You may have a greater risk of getting burned. If directed, put ice on the affected area as often as told by your health care provider. To do this:  Put ice in a plastic bag.  Place a towel between your skin and the bag.  Leave the ice on for 20 minutes, 2-3 times a day.      General instructions  Pay attention to any changes in your symptoms.  Try resting in an upright position instead of lying down. This may help  to reduce pressure in your ear and relieve pain.  Chew gum if it helps to relieve your ear pain.  Treat any allergies as told by your health care provider.  Drink enough fluid to keep your urine pale yellow.  It is up to you to get the results of any tests that were done. Ask your health care provider, or the department that is doing the tests, when your results will be ready.  Keep all follow-up visits as told by your health care provider. This is important. Contact a health care provider if:  Your pain does not improve within 2 days.  Your earache gets worse.  You have new symptoms.  You have a fever. Get help right away if you:  Have a severe headache.  Have a stiff neck.  Have trouble swallowing.  Have redness or swelling behind your ear.  Have fluid or blood coming from your ear.  Have hearing loss.  Feel dizzy. Summary  An earache, or ear pain, can be caused by many things.  Treatment of the earache will depend on the cause. Follow recommendations from your health care provider to treat your ear pain.  If the cause is not clear or cannot be determined, you may need to watch your symptoms until your earache goes away or until a cause is found.  Keep all follow-up visits as told by your health care provider. This is important. This information is not intended to replace advice given to you by your health care provider. Make sure you discuss any questions you have with your health care provider. Document Revised: 06/07/2019 Document Reviewed: 06/07/2019 Elsevier Patient Education  2021 [place].  Sore Throat A sore throat is pain, burning, irritation, or scratchiness in the throat. When you have a sore throat, you may feel pain or tenderness in your throat when you swallow or talk. Many things can cause a sore throat, including:  An infection.  Seasonal allergies.  Dryness in the air.  Irritants, such as smoke or pollution.  Radiation treatment to  the area.  Gastroesophageal reflux disease (GERD).  A tumor. A sore throat is often the first sign of another sickness. It may happen with other symptoms, such as coughing, sneezing, fever, and swollen neck glands. Most sore throats go away without medical treatment. Follow these instructions at home:  Take over-the-counter medicines only as told by your health care provider. ?  If your child has a sore throat, do not give your child aspirin because of the association with Reye syndrome.  Drink enough fluids to keep your urine pale yellow.  Rest as needed.  To help with pain, try: ? Sipping warm liquids, such as broth, herbal tea, or warm water. ? Eating or drinking cold or frozen liquids, such as frozen ice pops. ? Gargling with a salt-water mixture 3-4 times a day or as needed. To make a salt-water mixture, completely dissolve -1 tsp (3-6 g) of salt in 1 cup (237 mL) of warm water. ? Sucking on hard candy or throat lozenges. ? Putting a cool-mist humidifier in your bedroom at night to moisten the air. ? Sitting in the bathroom with the door closed for 5-10 minutes while you run hot water in the shower.  Do not use any products that contain nicotine or tobacco, such as cigarettes, e-cigarettes, and chewing tobacco. If you need help quitting, ask your health care provider.  Wash your hands well and often with soap and water. If soap and water are not available, use hand sanitizer.      Contact a health care provider if:  You have a fever for more than 2-3 days.  You have symptoms that last (are persistent) for more than 2-3 days.  Your throat does not get better within 7 days.  You have a fever and your symptoms suddenly get worse.  Your child who is 3 months to 68 years old has a temperature of 102.63F (39C) or higher. Get help right away if:  You have difficulty breathing.  You cannot swallow fluids, soft foods, or your saliva.  You have increased swelling in your  throat or neck.  You have persistent nausea and vomiting. Summary  A sore throat is pain, burning, irritation, or scratchiness in the throat. Many things can cause a sore throat.  Take over-the-counter medicines only as told by your health care provider. Do not give your child aspirin.  Drink plenty of fluids, and rest as needed.  Contact a health care provider if your symptoms worsen or your sore throat does not get better within 7 days. This information is not intended to replace advice given to you by your health care provider. Make sure you discuss any questions you have with your health care provider. Document Revised: 04/01/2018 Document Reviewed: 04/01/2018 Elsevier Patient Education  2021 [employer].

## 2021-01-05 ENCOUNTER — Telehealth: Payer: Self-pay | Admitting: Internal Medicine

## 2021-01-05 NOTE — Addendum Note (Signed)
Addended by: Orland Mustard on: 01/05/2021 05:21 PM   Modules accepted: Orders

## 2021-01-05 NOTE — Telephone Encounter (Signed)
Patient would like a referral to [facility] in [place].

## 2021-01-05 NOTE — Telephone Encounter (Signed)
Please advise 

## 2021-03-30 ENCOUNTER — Telehealth: Payer: Self-pay | Admitting: Internal Medicine

## 2021-03-30 NOTE — Telephone Encounter (Signed)
Patient made an appointment with Dr Aundra Dubin tomorrow at 2:30pm for upper back pain. Patient states she is not sure if she pulled a muscle. Patient stated she did not fall.

## 2021-03-30 NOTE — Telephone Encounter (Signed)
Noted  For your information   

## 2021-03-31 ENCOUNTER — Encounter: Payer: Self-pay | Admitting: Internal Medicine

## 2021-03-31 ENCOUNTER — Other Ambulatory Visit: Payer: Self-pay

## 2021-03-31 ENCOUNTER — Ambulatory Visit: Payer: BC Managed Care – PPO | Admitting: Internal Medicine

## 2021-03-31 ENCOUNTER — Ambulatory Visit
Admission: RE | Admit: 2021-03-31 | Discharge: 2021-03-31 | Disposition: A | Discharge status: Home / Self Care | Payer: BC Managed Care – PPO | Source: Ambulatory Visit | Attending: Internal Medicine | Admitting: Internal Medicine

## 2021-03-31 ENCOUNTER — Ambulatory Visit
Admission: RE | Admit: 2021-03-31 | Discharge: 2021-03-31 | Disposition: A | Discharge status: Home / Self Care | Payer: BC Managed Care – PPO | Attending: Internal Medicine | Admitting: Internal Medicine

## 2021-03-31 VITALS — BP 116/80 | HR 59 | Temp 98.2°F | Ht 64.0 in | Wt 208.4 lb

## 2021-03-31 DIAGNOSIS — M47814 Spondylosis without myelopathy or radiculopathy, thoracic region: Secondary | ICD-10-CM

## 2021-03-31 DIAGNOSIS — M549 Dorsalgia, unspecified: Secondary | ICD-10-CM

## 2021-03-31 DIAGNOSIS — M542 Cervicalgia: Secondary | ICD-10-CM | POA: Diagnosis present

## 2021-03-31 DIAGNOSIS — I10 Essential (primary) hypertension: Secondary | ICD-10-CM

## 2021-03-31 DIAGNOSIS — E876 Hypokalemia: Secondary | ICD-10-CM

## 2021-03-31 DIAGNOSIS — M25512 Pain in left shoulder: Secondary | ICD-10-CM

## 2021-03-31 DIAGNOSIS — M47812 Spondylosis without myelopathy or radiculopathy, cervical region: Secondary | ICD-10-CM

## 2021-03-31 DIAGNOSIS — F419 Anxiety disorder, unspecified: Secondary | ICD-10-CM

## 2021-03-31 DIAGNOSIS — Z1231 Encounter for screening mammogram for malignant neoplasm of breast: Secondary | ICD-10-CM

## 2021-03-31 DIAGNOSIS — M62838 Other muscle spasm: Secondary | ICD-10-CM

## 2021-03-31 DIAGNOSIS — J309 Allergic rhinitis, unspecified: Secondary | ICD-10-CM

## 2021-03-31 DIAGNOSIS — K219 Gastro-esophageal reflux disease without esophagitis: Secondary | ICD-10-CM

## 2021-03-31 MED ORDER — TIZANIDINE HCL 4 MG PO CAPS: 4.0000 mg | ORAL_CAPSULE | Freq: Every evening | ORAL | 2 refills | Status: DC | PRN

## 2021-03-31 MED ORDER — TRAMADOL HCL 50 MG PO TABS: 50.0000 mg | ORAL_TABLET | Freq: Two times a day (BID) | ORAL | 0 refills | Status: AC | PRN

## 2021-03-31 NOTE — Patient Instructions (Addendum)
Consider tramadol for pain (call me back if needed), consider orthopedics, PT  biofreeze or voltaren gel or aspercream   Shoulder Range of Motion Exercises Shoulder range of motion (ROM) exercises are done to keep the shoulder moving freely or to increase movement. They are often recommended for people who have shoulder pain or stiffness or who are recovering from a shoulder surgery. Phase 1 exercises When you are able, do this exercise 1-2 times per day for 30-60 seconds in each direction, or as directed by your health care provider. Pendulum exercise To do this exercise while sitting: 1. Sit in a chair or at the edge of your bed with your feet flat on the floor. 2. Let your affected arm hang down in front of you over the edge of the bed or chair. 3. Relax your shoulder, arm, and hand. [place] your body so your arm gently swings in small circles. You can also use your unaffected arm to start the motion. 5. Repeat changing the direction of the circles, swinging your arm left and right, and swinging your arm forward and back. To do this exercise while standing: 1. Stand next to a sturdy chair or table, and hold on to it with your hand on your unaffected side. 2. Bend forward at the waist. 3. Bend your knees slightly. 4. Relax your shoulder, arm, and hand. 5. While keeping your shoulder relaxed, use body motion to swing your arm in small circles. 6. Repeat changing the direction of the circles, swinging your arm left and right, and swinging your arm forward and back. 7. Between exercises, stand up tall and take a short break to relax your lower back.   Phase 2 exercises Do these exercises 1-2 times per day or as told by your health care provider. Hold each stretch for 30 seconds, and repeat 3 times. Do the exercises with one or both arms as instructed by your health care provider. For these exercises, sit at a table with your hand and arm supported by the table. A chair that slides easily or  has wheels can be helpful. External rotation 1. Turn your chair so that your affected side is nearest to the table. 2. Place your forearm on the table to your side. Bend your elbow about 90 at the elbow (right angle) and place your hand palm facing down on the table. Your elbow should be about 6 inches away from your side. 3. Keeping your arm on the table, lean your body forward. Abduction 1. Turn your chair so that your affected side is nearest to the table. 2. Place your forearm and hand on the table so that your thumb points toward the ceiling and your arm is straight out to your side. 3. Slide your hand out to the side and away from you, using your unaffected arm to do the work. 4. To increase the stretch, you can slide your chair away from the table. Flexion: forward stretch 1. Sit facing the table. Place your hand and elbow on the table in front of you. 2. Slide your hand forward and away from you, using your unaffected arm to do the work. 3. To increase the stretch, you can slide your chair backward. Phase 3 exercises Do these exercises 1-2 times per day or as told by your health care provider. Hold each stretch for 30 seconds, and repeat 3 times. Do the exercises with one or both arms as instructed by your health care provider. Cross-body stretch: posterior capsule stretch 1.  Lift your arm straight out in front of you. 2. Bend your arm 90 at the elbow (right angle) so your forearm moves across your body. 3. Use your other arm to gently pull the elbow across your body, toward your other shoulder. Wall climbs 1. Stand with your affected arm extended out to the side with your hand resting on a door frame. 2. Slide your hand slowly up the door frame. 3. To increase the stretch, step through the door frame. Keep your body upright and do not lean. Wand exercises You will need a cane, a piece of PVC pipe, or a sturdy wooden dowel for wand exercises. Flexion To do this exercise while  standing: 1. Hold the wand with both of your hands, palms down. 2. Using the other arm to help, lift your arms up and over your head, if able. 3. Push upward with your other arm to gently increase the stretch. To do this exercise while lying down: 1. Lie on your back with your elbows resting on the floor and the wand in both your hands. Your hands will be palm down, or pointing toward your feet. 2. Lift your hands toward the ceiling, using your unaffected arm to help if needed. 3. Bring your arms overhead as able, using your unaffected arm to help if needed. Internal rotation 1. Stand while holding the wand behind you with both hands. Your unaffected arm should be extended above your head with the arm of the affected side extended behind you at the level of your waist. The wand should be pointing straight up and down as you hold it. 2. Slowly pull the wand up behind your back by straightening the elbow of your unaffected arm and bending the elbow of your affected arm. External rotation 1. Lie on your back with your affected upper arm supported on a small pillow or rolled towel. When you first do this exercise, keep your upper arm close to your body. Over time, bring your arm up to a 90 angle out to the side. 2. Hold the wand across your stomach and with both hands palm up. Your elbow on your affected side should be bent at a 90 angle. 3. Use your unaffected side to help push your forearm away from you and toward the floor. Keep your elbow on your affected side bent at a 90 angle. Contact a health care provider if you have:  New or increasing pain.  New numbness, tingling, weakness, or discoloration in your arm or hand. This information is not intended to replace advice given to you by your health care provider. Make sure you discuss any questions you have with your health care provider. Document Revised: 12/12/2017 Document Reviewed: 12/12/2017 Elsevier Patient Education  2021 [place].  Shoulder Exercises Ask your health care provider which exercises are safe for you. Do exercises exactly as told by your health care provider and adjust them as directed. It is normal to feel mild stretching, pulling, tightness, or discomfort as you do these exercises. Stop right away if you feel sudden pain or your pain gets worse. Do not begin these exercises until told by your health care provider. Stretching exercises External rotation and abduction This exercise is sometimes called corner stretch. This exercise rotates your arm outward (external rotation) and moves your arm out from your body (abduction). 6. Stand in a doorway with one of your feet slightly in front of the other. This is called a staggered stance. If you cannot reach your  forearms to the door frame, stand facing a corner of a room. 7. Choose one of the following positions as told by your health care provider: ? Place your hands and forearms on the door frame above your head. ? Place your hands and forearms on the door frame at the height of your head. ? Place your hands on the door frame at the height of your elbows. 8. Slowly move your weight onto your front foot until you feel a stretch across your chest and in the front of your shoulders. Keep your head and chest upright and keep your abdominal muscles tight. 9. Hold for __________ seconds. 10. To release the stretch, shift your weight to your back foot. Repeat __________ times. Complete this exercise __________ times a day.   Extension, standing 8. Stand and hold a broomstick, a cane, or a similar object behind your back. ? Your hands should be a little wider than shoulder width apart. ? Your palms should face away from your back. 9. Keeping your elbows straight and your shoulder muscles relaxed, move the stick away from your body until you feel a stretch in your shoulders (extension). ? Avoid shrugging your shoulders while you move the stick. Keep your shoulder  blades tucked down toward the middle of your back. 10. Hold for __________ seconds. 11. Slowly return to the starting position. Repeat __________ times. Complete this exercise __________ times a day. Range-of-motion exercises Pendulum 4. Stand near a wall or a surface that you can hold onto for balance. 5. Bend at the waist and let your left / right arm hang straight down. Use your other arm to support you. Keep your back straight and do not lock your knees. 6. Relax your left / right arm and shoulder muscles, and move your hips and your trunk so your left / right arm swings freely. Your arm should swing because of the motion of your body, not because you are using your arm or shoulder muscles. 7. Keep moving your hips and trunk so your arm swings in the following directions, as told by your health care provider: ? Side to side. ? Forward and backward. ? In clockwise and counterclockwise circles. 8. Continue each motion for __________ seconds, or for as long as told by your health care provider. 9. Slowly return to the starting position. Repeat __________ times. Complete this exercise __________ times a day.   Shoulder flexion, standing 5. Stand and hold a broomstick, a cane, or a similar object. Place your hands a little more than shoulder width apart on the object. Your left / right hand should be palm up, and your other hand should be palm down. 6. Keep your elbow straight and your shoulder muscles relaxed. Push the stick up with your healthy arm to raise your left / right arm in front of your body, and then over your head until you feel a stretch in your shoulder (flexion). ? Avoid shrugging your shoulder while you raise your arm. Keep your shoulder blade tucked down toward the middle of your back. 7. Hold for __________ seconds. 8. Slowly return to the starting position. Repeat __________ times. Complete this exercise __________ times a day.   Shoulder abduction, standing 4. Stand and  hold a broomstick, a cane, or a similar object. Place your hands a little more than shoulder width apart on the object. Your left / right hand should be palm up, and your other hand should be palm down. 5. Keep your elbow straight and your  shoulder muscles relaxed. Push the object across your body toward your left / right side. Raise your left / right arm to the side of your body (abduction) until you feel a stretch in your shoulder. ? Do not raise your arm above shoulder height unless your health care provider tells you to do that. ? If directed, raise your arm over your head. ? Avoid shrugging your shoulder while you raise your arm. Keep your shoulder blade tucked down toward the middle of your back. 6. Hold for __________ seconds. 7. Slowly return to the starting position. Repeat __________ times. Complete this exercise __________ times a day. Internal rotation 4. Place your left / right hand behind your back, palm up. 5. Use your other hand to dangle an exercise band, a towel, or a similar object over your shoulder. Grasp the band with your left / right hand so you are holding on to both ends. 6. Gently pull up on the band until you feel a stretch in the front of your left / right shoulder. The movement of your arm toward the center of your body is called internal rotation. ? Avoid shrugging your shoulder while you raise your arm. Keep your shoulder blade tucked down toward the middle of your back. 7. Hold for __________ seconds. 8. Release the stretch by letting go of the band and lowering your hands. Repeat __________ times. Complete this exercise __________ times a day.   Strengthening exercises External rotation 4. Sit in a stable chair without armrests. 5. Secure an exercise band to a stable object at elbow height on your left / right side. 6. Place a soft object, such as a folded towel or a small pillow, between your left / right upper arm and your body to move your elbow about 4 inches  (10 cm) away from your side. 7. Hold the end of the exercise band so it is tight and there is no slack. 8. Keeping your elbow pressed against the soft object, slowly move your forearm out, away from your abdomen (external rotation). Keep your body steady so only your forearm moves. 9. Hold for __________ seconds. 10. Slowly return to the starting position. Repeat __________ times. Complete this exercise __________ times a day.   Shoulder abduction 4. Sit in a stable chair without armrests, or stand up. 5. Hold a __________ weight in your left / right hand, or hold an exercise band with both hands. 6. Start with your arms straight down and your left / right palm facing in, toward your body. 7. Slowly lift your left / right hand out to your side (abduction). Do not lift your hand above shoulder height unless your health care provider tells you that this is safe. ? Keep your arms straight. ? Avoid shrugging your shoulder while you do this movement. Keep your shoulder blade tucked down toward the middle of your back. 8. Hold for __________ seconds. 9. Slowly lower your arm, and return to the starting position. Repeat __________ times. Complete this exercise __________ times a day.   Shoulder extension 4. Sit in a stable chair without armrests, or stand up. 5. Secure an exercise band to a stable object in front of you so it is at shoulder height. 6. Hold one end of the exercise band in each hand. Your palms should face each other. 7. Straighten your elbows and lift your hands up to shoulder height. 8. Step back, away from the secured end of the exercise band, until the band is tight  and there is no slack. 9. Squeeze your shoulder blades together as you pull your hands down to the sides of your thighs (extension). Stop when your hands are straight down by your sides. Do not let your hands go behind your body. 10. Hold for __________ seconds. 11. Slowly return to the starting position. Repeat  __________ times. Complete this exercise __________ times a day. Shoulder row 3. Sit in a stable chair without armrests, or stand up. 4. Secure an exercise band to a stable object in front of you so it is at waist height. 5. Hold one end of the exercise band in each hand. Position your palms so that your thumbs are facing the ceiling (neutral position). 6. Bend each of your elbows to a 90-degree angle (right angle) and keep your upper arms at your sides. 7. Step back until the band is tight and there is no slack. 8. Slowly pull your elbows back behind you. 9. Hold for __________ seconds. 10. Slowly return to the starting position. Repeat __________ times. Complete this exercise __________ times a day. Shoulder press-ups 4. Sit in a stable chair that has armrests. Sit upright, with your feet flat on the floor. 5. Put your hands on the armrests so your elbows are bent and your fingers are pointing forward. Your hands should be about even with the sides of your body. 6. Push down on the armrests and use your arms to lift yourself off the chair. Straighten your elbows and lift yourself up as much as you comfortably can. ? Move your shoulder blades down, and avoid letting your shoulders move up toward your ears. ? Keep your feet on the ground. As you get stronger, your feet should support less of your body weight as you lift yourself up. 7. Hold for __________ seconds. 8. Slowly lower yourself back into the chair. Repeat __________ times. Complete this exercise __________ times a day.   Wall push-ups 1. Stand so you are facing a stable wall. Your feet should be about one arm-length away from the wall. 2. Lean forward and place your palms on the wall at shoulder height. 3. Keep your feet flat on the floor as you bend your elbows and lean forward toward the wall. 4. Hold for __________ seconds. 5. Straighten your elbows to push yourself back to the starting position. Repeat __________ times.  Complete this exercise __________ times a day.   This information is not intended to replace advice given to you by your health care provider. Make sure you discuss any questions you have with your health care provider. Document Revised: 02/21/2019 Document Reviewed: 11/29/2018 Elsevier Patient Education  2021 [place]  Thoracic Strain Rehab Ask your health care provider which exercises are safe for you. Do exercises exactly as told by your health care provider and adjust them as directed. It is normal to feel mild stretching, pulling, tightness, or discomfort as you do these exercises. Stop right away if you feel sudden pain or your pain gets worse. Do not begin these exercises until told by your health care provider. Stretching and range-of-motion exercise This exercise warms up your muscles and joints and improves the movement and flexibility of your back and shoulders. This exercise also helps to relieve pain. Chest and spine stretch 1. Lie down on your back on a firm surface. 2. Roll a towel or a small blanket so it is about 4 inches (10 cm) in diameter. 3. Put the towel lengthwise under the middle of your back  so it is under your spine, but not under your shoulder blades. 4. Put your hands behind your head and let your elbows fall to your sides. This will increase your stretch. 5. Take a deep breath (inhale). 6. Hold for __________ seconds. 7. Relax after you breathe out (exhale). Repeat __________ times. Complete this exercise __________ times a day.   Strengthening exercises These exercises build strength and endurance in your back and your shoulder blade muscles. Endurance is the ability to use your muscles for a long time, even after they get tired. Alternating arm and leg raises 1. Get on your hands and knees on a firm surface. If you are on a hard floor, you may want to use padding, such as an exercise mat, to cushion your knees. 2. Line up your arms and legs. Your hands  should be directly below your shoulders, and your knees should be directly below your hips. 3. Lift your left leg behind you. At the same time, raise your right arm and straighten it in front of you. ? Do not lift your leg higher than your hip. ? Do not lift your arm higher than your shoulder. ? Keep your abdominal and back muscles tight. ? Keep your hips facing the ground. ? Do not arch your back. ? Keep your balance carefully, and do not hold your breath. 4. Hold for __________ seconds. 5. Slowly return to the starting position and repeat with your right leg and your left arm. Repeat __________ times. Complete this exercise __________ times a day.   Straight arm rows This exercise is also called shoulder extension exercise. 1. Stand with your feet shoulder width apart. 2. Secure an exercise band to a stable object in front of you so the band is at or above shoulder height. 3. Hold one end of the exercise band in each hand. 4. Straighten your elbows and lift your hands up to shoulder height. 5. Step back, away from the secured end of the exercise band, until the band stretches. 6. Squeeze your shoulder blades together and pull your hands down to the sides of your thighs. Stop when your hands are straight down by your sides. This is shoulder extension. Do not let your hands go behind your body. 7. Hold for __________ seconds. 8. Slowly return to the starting position. Repeat __________ times. Complete this exercise __________ times a day.   Prone shoulder external rotation 1. Lie on your abdomen on a firm bed so your left / right forearm hangs over the edge of the bed and your upper arm is on the bed, straight out from your body. This is the prone position. ? Your elbow should be bent. ? Your palm should be facing your feet. 2. If instructed, hold a __________ weight in your hand. 3. Squeeze your shoulder blade toward the middle of your back. Do not let your shoulder lift toward your  ear. 4. Keep your elbow bent in a 90-degree angle (right angle) while you slowly move your forearm up toward the ceiling. Move your forearm up to the height of the bed, toward your head. This is external rotation. ? Your upper arm should not move. ? At the top of the movement, your palm should face the floor. 5. Hold for __________ seconds. 6. Slowly return to the starting position and relax your muscles. Repeat __________ times. Complete this exercise __________ times a day. Rowing scapular retraction This is an exercise in which the shoulder blades (scapulae) are pulled toward each  other (retraction). 1. Sit in a stable chair without armrests, or stand up. 2. Secure an exercise band to a stable object in front of you so the band is at shoulder height. 3. Hold one end of the exercise band in each hand. Your palms should face down. 4. Bring your arms out straight in front of you. 5. Step back, away from the secured end of the exercise band, until the band stretches. 6. Pull the band backward. As you do this, bend your elbows and squeeze your shoulder blades together, but avoid letting the rest of your body move. Do not shrug your shoulders upward while you do this. 7. Stop when your elbows are at your sides or slightly behind your body. 8. Hold for __________ seconds. 9. Slowly straighten your arms to return to the starting position. Repeat __________ times. Complete this exercise __________ times a day.   Posture and body mechanics Good posture and healthy body mechanics can help to relieve stress in your body's tissues and joints. Body mechanics refers to the movements and positions of your body while you do your daily activities. Posture is part of body mechanics. Good posture means:  Your spine is in its natural S-curve position (neutral).  Your shoulders are pulled back slightly.  Your head is not tipped forward. Follow these guidelines to improve your posture and body mechanics in  your everyday activities. Standing  When standing, keep your spine neutral and your feet about hip width apart. Keep a slight bend in your knees. Your ears, shoulders, and hips should line up with each other.  When you do a task in which you lean forward while standing in one place for a long time, place one foot up on a stable object that is 2-4 inches (5-10 cm) high, such as a footstool. This helps keep your spine neutral.   Sitting  When sitting, keep your spine neutral and keep your feet flat on the floor. Use a footrest, if necessary, and keep your thighs parallel to the floor. Avoid rounding your shoulders, and avoid tilting your head forward.  When working at a desk or a computer, keep your desk at a height where your hands are slightly lower than your elbows. Slide your chair under your desk so you are close enough to maintain good posture.  When working at a computer, place your monitor at a height where you are looking straight ahead and you do not have to tilt your head forward or downward to look at the screen.   Resting When lying down and resting, avoid positions that are most painful for you.  If you have pain with activities such as sitting, bending, stooping, or squatting (flexion-basedactivities), lie in a position in which your body does not bend very much. For example, avoid curling up on your side with your arms and knees near your chest (fetal position).  If you have pain with activities such as standing for a long time or reaching with your arms (extension-basedactivities), lie with your spine in a neutral position and bend your knees slightly. Try the following positions: ? Lie on your side with a pillow between your knees. ? Lie on your back with a pillow under your knees.   Lifting  When lifting objects, keep your feet at least shoulder width apart and tighten your abdominal muscles.  Bend your knees and hips and keep your spine neutral. It is important to lift  using the strength of your legs, not your back.  Do not lock your knees straight out.  Always ask for help to lift heavy or awkward objects.   This information is not intended to replace advice given to you by your health care provider. Make sure you discuss any questions you have with your health care provider. Document Revised: 02/21/2019 Document Reviewed: 12/09/2018 Elsevier Patient Education  [place] or Strain Rehab Ask your health care provider which exercises are safe for you. Do exercises exactly as told by your health care provider and adjust them as directed. It is normal to feel mild stretching, pulling, tightness, or discomfort as you do these exercises. Stop right away if you feel sudden pain or your pain gets worse. Do not begin these exercises until told by your health care provider. Stretching and range-of-motion exercises These exercises warm up your muscles and joints and improve the movement and flexibility of your back. These exercises also help to relieve pain, numbness, and tingling. Lumbar rotation 1. Lie on your back on a firm surface and bend your knees. 2. Straighten your arms out to your sides so each arm forms a 90-degree angle (right angle) with a side of your body. 3. Slowly move (rotate) both of your knees to one side of your body until you feel a stretch in your lower back (lumbar). Try not to let your shoulders lift off the floor. 4. Hold this position for __________ seconds. 5. Tense your abdominal muscles and slowly move your knees back to the starting position. 6. Repeat this exercise on the other side of your body. Repeat __________ times. Complete this exercise __________ times a day.   Single knee to chest 1. Lie on your back on a firm surface with both legs straight. 2. Bend one of your knees. Use your hands to move your knee up toward your chest until you feel a gentle stretch in your lower back and buttock. ? Hold your leg in  this position by holding on to the front of your knee. ? Keep your other leg as straight as possible. 3. Hold this position for __________ seconds. 4. Slowly return to the starting position. 5. Repeat with your other leg. Repeat __________ times. Complete this exercise __________ times a day.   Prone extension on elbows 1. Lie on your abdomen on a firm surface (prone position). 2. Prop yourself up on your elbows. 3. Use your arms to help lift your chest up until you feel a gentle stretch in your abdomen and your lower back. ? This will place some of your body weight on your elbows. If this is uncomfortable, try stacking pillows under your chest. ? Your hips should stay down, against the surface that you are lying on. Keep your hip and back muscles relaxed. 4. Hold this position for __________ seconds. 5. Slowly relax your upper body and return to the starting position. Repeat __________ times. Complete this exercise __________ times a day.   Strengthening exercises These exercises build strength and endurance in your back. Endurance is the ability to use your muscles for a long time, even after they get tired. Pelvic tilt This exercise strengthens the muscles that lie deep in the abdomen. 1. Lie on your back on a firm surface. Bend your knees and keep your feet flat on the floor. 2. Tense your abdominal muscles. Tip your pelvis up toward the ceiling and flatten your lower back into the floor. ? To help with this exercise, you may place a small towel  under your lower back and try to push your back into the towel. 3. Hold this position for __________ seconds. 4. Let your muscles relax completely before you repeat this exercise. Repeat __________ times. Complete this exercise __________ times a day. Alternating arm and leg raises 1. Get on your hands and knees on a firm surface. If you are on a hard floor, you may want to use padding, such as an exercise mat, to cushion your knees. 2. Line up  your arms and legs. Your hands should be directly below your shoulders, and your knees should be directly below your hips. 3. Lift your left leg behind you. At the same time, raise your right arm and straighten it in front of you. ? Do not lift your leg higher than your hip. ? Do not lift your arm higher than your shoulder. ? Keep your abdominal and back muscles tight. ? Keep your hips facing the ground. ? Do not arch your back. ? Keep your balance carefully, and do not hold your breath. 4. Hold this position for __________ seconds. 5. Slowly return to the starting position. 6. Repeat with your right leg and your left arm. Repeat __________ times. Complete this exercise __________ times a day.   Abdominal set with straight leg raise 1. Lie on your back on a firm surface. 2. Bend one of your knees and keep your other leg straight. 3. Tense your abdominal muscles and lift your straight leg up, 4-6 inches (10-15 cm) off the ground. 4. Keep your abdominal muscles tight and hold this position for __________ seconds. ? Do not hold your breath. ? Do not arch your back. Keep it flat against the ground. 5. Keep your abdominal muscles tense as you slowly lower your leg back to the starting position. 6. Repeat with your other leg. Repeat __________ times. Complete this exercise __________ times a day.   Single leg lower with bent knees 1. Lie on your back on a firm surface. 2. Tense your abdominal muscles and lift your feet off the floor, one foot at a time, so your knees and hips are bent in 90-degree angles (right angles). ? Your knees should be over your hips and your lower legs should be parallel to the floor. 3. Keeping your abdominal muscles tense and your knee bent, slowly lower one of your legs so your toe touches the ground. 4. Lift your leg back up to return to the starting position. ? Do not hold your breath. ? Do not let your back arch. Keep your back flat against the ground. 5. Repeat  with your other leg. Repeat __________ times. Complete this exercise __________ times a day. Posture and body mechanics Good posture and healthy body mechanics can help to relieve stress in your body's tissues and joints. Body mechanics refers to the movements and positions of your body while you do your daily activities. Posture is part of body mechanics. Good posture means:  Your spine is in its natural S-curve position (neutral).  Your shoulders are pulled back slightly.  Your head is not tipped forward. Follow these guidelines to improve your posture and body mechanics in your everyday activities. Standing  When standing, keep your spine neutral and your feet about hip width apart. Keep a slight bend in your knees. Your ears, shoulders, and hips should line up.  When you do a task in which you stand in one place for a long time, place one foot up on a stable object that is  2-4 inches (5-10 cm) high, such as a footstool. This helps keep your spine neutral.   Sitting  When sitting, keep your spine neutral and keep your feet flat on the floor. Use a footrest, if necessary, and keep your thighs parallel to the floor. Avoid rounding your shoulders, and avoid tilting your head forward.  When working at a desk or a computer, keep your desk at a height where your hands are slightly lower than your elbows. Slide your chair under your desk so you are close enough to maintain good posture.  When working at a computer, place your monitor at a height where you are looking straight ahead and you do not have to tilt your head forward or downward to look at the screen.   Resting  When lying down and resting, avoid positions that are most painful for you.  If you have pain with activities such as sitting, bending, stooping, or squatting, lie in a position in which your body does not bend very much. For example, avoid curling up on your side with your arms and knees near your chest (fetal  position).  If you have pain with activities such as standing for a long time or reaching with your arms, lie with your spine in a neutral position and bend your knees slightly. Try the following positions: ? Lying on your side with a pillow between your knees. ? Lying on your back with a pillow under your knees. Lifting  When lifting objects, keep your feet at least shoulder width apart and tighten your abdominal muscles.  Bend your knees and hips and keep your spine neutral. It is important to lift using the strength of your legs, not your back. Do not lock your knees straight out.  Always ask for help to lift heavy or awkward objects.   This information is not intended to replace advice given to you by your health care provider. Make sure you discuss any questions you have with your health care provider. Document Revised: 02/21/2019 Document Reviewed: 11/21/2018 Elsevier Patient Education  2021 [place].  Back Exercises The following exercises strengthen the muscles that help to support the trunk and back. They also help to keep the lower back flexible. Doing these exercises can help to prevent back pain or lessen existing pain.  If you have back pain or discomfort, try doing these exercises 2-3 times each day or as told by your health care provider.  As your pain improves, do them once each day, but increase the number of times that you repeat the steps for each exercise (do more repetitions).  To prevent the recurrence of back pain, continue to do these exercises once each day or as told by your health care provider. Do exercises exactly as told by your health care provider and adjust them as directed. It is normal to feel mild stretching, pulling, tightness, or discomfort as you do these exercises, but you should stop right away if you feel sudden pain or your pain gets worse. Exercises Single knee to chest Repeat these steps 3-5 times for each leg: 12. Lie on your back on a  firm bed or the floor with your legs extended. 13. Bring one knee to your chest. Your other leg should stay extended and in contact with the floor. 39. Hold your knee in place by grabbing your knee or thigh with both hands and hold. 15. Pull on your knee until you feel a gentle stretch in your lower back or buttocks.  16. Hold the stretch for 10-30 seconds. 17. Slowly release and straighten your leg. Pelvic tilt Repeat these steps 5-10 times: 10. Lie on your back on a firm bed or the floor with your legs extended. [place] your knees so they are pointing toward the ceiling and your feet are flat on the floor. 12. Tighten your lower abdominal muscles to press your lower back against the floor. This motion will tilt your pelvis so your tailbone points up toward the ceiling instead of pointing to your feet or the floor. 13. With gentle tension and even breathing, hold this position for 5-10 seconds. Cat-cow Repeat these steps until your lower back becomes more flexible: 9. Get into a hands-and-knees position on a firm surface. Keep your hands under your shoulders, and keep your knees under your hips. You may place padding under your knees for comfort. 10. Let your head hang down toward your chest. Contract your abdominal muscles and point your tailbone toward the floor so your lower back becomes rounded like the back of a cat. 11. Hold this position for 5 seconds. 12. Slowly lift your head, let your abdominal muscles relax and point your tailbone up toward the ceiling so your back forms a sagging arch like the back of a cow. 13. Hold this position for 5 seconds.   Press-ups Repeat these steps 5-10 times: 8. Lie on your abdomen (face-down) on the floor. 9. Place your palms near your head, about shoulder-width apart. 10. Keeping your back as relaxed as possible and keeping your hips on the floor, slowly straighten your arms to raise the top half of your body and lift your shoulders. Do not use your  back muscles to raise your upper torso. You may adjust the placement of your hands to make yourself more comfortable. 11. Hold this position for 5 seconds while you keep your back relaxed. 12. Slowly return to lying flat on the floor.   Bridges Repeat these steps 10 times: 9. Lie on your back on a firm surface. [place] your knees so they are pointing toward the ceiling and your feet are flat on the floor. Your arms should be flat at your sides, next to your body. 31. Tighten your buttocks muscles and lift your buttocks off the floor until your waist is at almost the same height as your knees. You should feel the muscles working in your buttocks and the back of your thighs. If you do not feel these muscles, slide your feet 1-2 inches farther away from your buttocks. 12. Hold this position for 3-5 seconds. 13. Slowly lower your hips to the starting position, and allow your buttocks muscles to relax completely. If this exercise is too easy, try doing it with your arms crossed over your chest.   Abdominal crunches Repeat these steps 5-10 times: 11. Lie on your back on a firm bed or the floor with your legs extended. 12. Bend your knees so they are pointing toward the ceiling and your feet are flat on the floor. 54. Cross your arms over your chest. 14. Tip your chin slightly toward your chest without bending your neck. 76. Tighten your abdominal muscles and slowly raise your trunk (torso) high enough to lift your shoulder blades a tiny bit off the floor. Avoid raising your torso higher than that because it can put too much stress on your low back and does not help to strengthen your abdominal muscles. 16. Slowly return to your starting position. Back lifts Repeat these  steps 5-10 times: 10. Lie on your abdomen (face-down) with your arms at your sides, and rest your forehead on the floor. 11. Tighten the muscles in your legs and your buttocks. 12. Slowly lift your chest off the floor while you keep  your hips pressed to the floor. Keep the back of your head in line with the curve in your back. Your eyes should be looking at the floor. 13. Hold this position for 3-5 seconds. 14. Slowly return to your starting position. Contact a health care provider if:  Your back pain or discomfort gets much worse when you do an exercise.  Your worsening back pain or discomfort does not lessen within 2 hours after you exercise. If you have any of these problems, stop doing these exercises right away. Do not do them again unless your health care provider says that you can. Get help right away if:  You develop sudden, severe back pain. If this happens, stop doing the exercises right away. Do not do them again unless your health care provider says that you can. This information is not intended to replace advice given to you by your health care provider. Make sure you discuss any questions you have with your health care provider. Document Revised: 03/06/2019 Document Reviewed: 08/01/2018 Elsevier Patient Education  2021 [place].  Neck Exercises Ask your health care provider which exercises are safe for you. Do exercises exactly as told by your health care provider and adjust them as directed. It is normal to feel mild stretching, pulling, tightness, or discomfort as you do these exercises. Stop right away if you feel sudden pain or your pain gets worse. Do not begin these exercises until told by your health care provider. Neck exercises can be important for many reasons. They can improve strength and maintain flexibility in your neck, which will help your upper back and prevent neck pain. Stretching exercises Rotation neck stretching 4. Sit in a chair or stand up. 5. Place your feet flat on the floor, shoulder width apart. 6. Slowly turn your head (rotate) to the right until a slight stretch is felt. Turn it all the way to the right so you can look over your right shoulder. Do not tilt or tip your  head. 7. Hold this position for 10-30 seconds. 8. Slowly turn your head (rotate) to the left until a slight stretch is felt. Turn it all the way to the left so you can look over your left shoulder. Do not tilt or tip your head. 9. Hold this position for 10-30 seconds. Repeat __________ times. Complete this exercise __________ times a day.   Neck retraction 18. Sit in a sturdy chair or stand up. 19. Look straight ahead. Do not bend your neck. 20. Use your fingers to push your chin backward (retraction). Do not bend your neck for this movement. Continue to face straight ahead. If you are doing the exercise properly, you will feel a slight sensation in your throat and a stretch at the back of your neck. 21. Hold the stretch for 1-2 seconds. Repeat __________ times. Complete this exercise __________ times a day. Strengthening exercises Neck press 14. Lie on your back on a firm bed or on the floor with a pillow under your head. 15. Use your neck muscles to push your head down on the pillow and straighten your spine. 16. Hold the position as well as you can. Keep your head facing up (in a neutral position) and your chin tucked. 17. Slowly  count to 5 while holding this position. Repeat __________ times. Complete this exercise __________ times a day. Isometrics These are exercises in which you strengthen the muscles in your neck while keeping your neck still (isometrics). 14. Sit in a supportive chair and place your hand on your forehead. 15. Keep your head and face facing straight ahead. Do not flex or extend your neck while doing isometrics. 16. Push forward with your head and neck while pushing back with your hand. Hold for 10 seconds. 17. Do the sequence again, this time putting your hand against the back of your head. Use your head and neck to push backward against the hand pressure. 18. Finally, do the same exercise on either side of your head, pushing sideways against the pressure of your  hand. Repeat __________ times. Complete this exercise __________ times a day. Prone head lifts 13. Lie face-down (prone position), resting on your elbows so that your chest and upper back are raised. 67. Start with your head facing downward, near your chest. Position your chin either on or near your chest. 15. Slowly lift your head upward. Lift until you are looking straight ahead. Then continue lifting your head as far back as you can comfortably stretch. 16. Hold your head up for 5 seconds. Then slowly lower it to your starting position. Repeat __________ times. Complete this exercise __________ times a day. Supine head lifts 14. Lie on your back (supine position), bending your knees to point to the ceiling and keeping your feet flat on the floor. 15. Lift your head slowly off the floor, raising your chin toward your chest. 16. Hold for 5 seconds. Repeat __________ times. Complete this exercise __________ times a day. Scapular retraction 17. Stand with your arms at your sides. Look straight ahead. 18. Slowly pull both shoulders (scapulae) backward and downward (retraction) until you feel a stretch between your shoulder blades in your upper back. 19. Hold for 10-30 seconds. 20. Relax and repeat. Repeat __________ times. Complete this exercise __________ times a day. Contact a health care provider if:  Your neck pain or discomfort gets much worse when you do an exercise.  Your neck pain or discomfort does not improve within 2 hours after you exercise. If you have any of these problems, stop exercising right away. Do not do the exercises again unless your health care provider says that you can. Get help right away if:  You develop sudden, severe neck pain. If this happens, stop exercising right away. Do not do the exercises again unless your health care provider says that you can. This information is not intended to replace advice given to you by your health care provider. Make sure you  discuss any questions you have with your health care provider. Document Revised: 08/28/2018 Document Reviewed: 08/28/2018 Elsevier Patient Education  2021 [employer].

## 2021-03-31 NOTE — Progress Notes (Addendum)
Chief Complaint  Patient presents with  . Back Pain   F/u  1. Mid Back pain x 2 weeks and neck pain and left shoulder arm pain tried heat w/o help no known injury. Pain with ROM in left shoulder pain today 5/10 has been 8.5/10 tried otc nsaid 800 mg with some relief and feels tense in the muscles   Review of Systems  Constitutional: Negative for weight loss.  HENT: Negative for hearing loss.   Eyes: Negative for blurred vision.  Cardiovascular: Negative for chest pain.  Gastrointestinal: Negative for abdominal pain.  Musculoskeletal: Positive for back pain, joint pain and neck pain. Negative for falls.       +spasm  Skin: Negative for rash.  Psychiatric/Behavioral: Negative for depression.   Past Medical History:  Diagnosis Date  . Abnormal thyroid function test   . Anxiety   . 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 Colon polyp 2014  . Enlarged thyroid   . Genital warts    inner labia minora  . Hypertension   . UTI (urinary tract infection)    Past Surgical History:  Procedure Laterality Date  . ABDOMINAL HYSTERECTOMY     2009  . BREAST BIOPSY Right 2003  . CARPAL TUNNEL RELEASE    . COLONOSCOPY WITH PROPOFOL N/A 05/18/2020   Procedure: COLONOSCOPY WITH PROPOFOL;  Surgeon: Jonathon Bellows, MD;  Location: [facility] ENDOSCOPY;  Service: Gastroenterology;  Laterality: N/A;  . COLONOSCOPY WITH PROPOFOL N/A 05/19/2020   Procedure: COLONOSCOPY WITH PROPOFOL;  Surgeon: Jonathon Bellows, MD;  Location: [facility] ENDOSCOPY;  Service: Gastroenterology;  Laterality: N/A;  . FOOT SURGERY     right  . TONSILLECTOMY     1987  . UNILATERAL SALPINGECTOMY     left   Family History  Problem Relation Age of Onset  . Hypertension Mother   . Hyperlipidemia Mother   . Hypertension Father   . Breast cancer Neg Hx   . Ovarian cancer Neg Hx   . Stroke Neg Hx   . Thyroid disease Neg Hx    Social History   Socioeconomic History  . Marital status: Married    Spouse name: Not on file   . Number of children: Not on file  . Years of education: Not on file  . Highest education level: Not on file  Occupational History  . Not on file  Tobacco Use  . Smoking status: Former Smoker    Packs/day: 0.25    Quit date: 2020    Years since quitting: 2.3  . Smokeless tobacco: Never Used  Vaping Use  . Vaping Use: Never used  Substance and Sexual Activity  . Alcohol use: Yes    Comment: socially  . Drug use: No  . Sexual activity: Yes    Birth control/protection: Surgical  Other Topics Concern  . 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 ALPRAZolam (XANAX) 0.25 MG tablet Take 1 tablet (0.25 mg total) by mouth at bedtime as needed.  Marland Kitchen CA-MG-VALERIAN-PASSIF-GINGKO PO Take by mouth.  . Cholecalciferol ([employer] D3) 125 MCG (5000 UT) capsule Take 1 capsule (5,000 Units total) by mouth daily.  . cyanocobalamin 100 MCG tablet Take 100 mcg by mouth daily.  Marland Kitchen escitalopram (LEXAPRO) 10 MG tablet Take 1 tablet (10 mg total) by mouth daily.  . hydrochlorothiazide (HYDRODIURIL) 12.5 MG tablet Take 1 tablet (12.5 mg total) by mouth daily. In am  . levocetirizine (XYZAL) 5 MG tablet Take 1 tablet (5 mg total) by mouth every evening.  . mometasone (NASONEX) 50 MCG/ACT nasal spray 2 sprays daily.  . Multiple Vitamin (MULTIVITAMIN) capsule Take by mouth daily.   . Nutritional Supplements (ESTROVEN PO) Take by mouth.   . pantoprazole (PROTONIX) 40 MG tablet  Take 1 tablet (40 mg total) by mouth daily.  . potassium chloride SA (KLOR-CON) 20 MEQ tablet Take 1 tablet (20 mEq total) by mouth daily.  Marland Kitchen telmisartan (MICARDIS) 20 MG tablet Take 1 tablet (20 mg total) by mouth daily.  Marland Kitchen tiZANidine (ZANAFLEX) 4 MG capsule Take 1 capsule (4 mg total) by mouth at bedtime as needed for muscle spasms.  . traMADol (ULTRAM) 50 MG tablet Take 1 tablet (50 mg total) by mouth every 12 (twelve) hours as needed for up to 5 days.   Allergies  Allergen Reactions  . Losartan Potassium-Hctz     Other reaction(s): Other (See Comments) Weight gain, constipation  . Tape Rash   No results found for this or any previous visit (from the past 2160 hour(s)). Objective  Body mass index is 35.77 kg/m. Wt Readings from Last 3 Encounters:  03/31/21 208 lb 6.4 oz (94.5 kg)  12/21/20 205 lb (93 kg)  11/26/20 207 lb 8 oz (94.1 kg)   Temp Readings from Last 3 Encounters:  03/31/21 98.2 F (36.8 C) (Oral)  09/23/20 98.6 F (37 C) (Oral)  05/19/20 98 F (36.7 C) (Temporal)   BP Readings from Last 3 Encounters:  03/31/21 116/80  12/21/20 124/78  11/26/20 131/72   Pulse Readings from Last 3 Encounters:  03/31/21 (!) 59  11/26/20 (!) 59  09/23/20 60    Physical Exam Vitals and nursing note reviewed.  Constitutional:      Appearance: Normal appearance. She is well-developed and well-groomed. She is obese.  HENT:     Head: Normocephalic and atraumatic.  Eyes:     Conjunctiva/sclera: Conjunctivae normal.     Pupils: Pupils are equal, round, and reactive to light.  Cardiovascular:     Rate and Rhythm: Normal rate and regular rhythm.     Heart sounds: Normal heart sounds. No murmur heard.   Pulmonary:     Effort: Pulmonary effort is normal.     Breath sounds: Normal breath sounds.  Musculoskeletal:     Right shoulder: Tenderness present. Decreased range of motion.       Arms:     Cervical back: Spasms and tenderness present.     Thoracic back: Spasms and  tenderness present.  Skin:    General: Skin is warm and dry.  Neurological:     General: No focal deficit present.     Mental Status: She is alert and oriented to person, place, and time. Mental status is at baseline.     Gait: Gait normal.  Psychiatric:  Attention and Perception: Attention and perception normal.        Mood and Affect: Mood and affect normal.        Speech: Speech normal.        Behavior: Behavior normal. Behavior is cooperative.        Thought Content: Thought content normal.        Cognition and Memory: Cognition and memory normal.        Judgment: Judgment normal.     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  FINDINGS: There is mild degenerative change at C4-5. No acute fracture or subluxation. Prevertebral soft tissues are unremarkable. Lung apices are clear.  IMPRESSION: No evidence for acute  abnormality.  C4-5 degenerative changes.   Electronically Signed   By: Nolon Nations M.D.   On: 04/02/2021 14:13  Mid back pain  03/31/21 mid arthritis- Plan: DG Thoracic Spine 2 View, tiZANidine (ZANAFLEX) 4 MG capsule, traMADol (ULTRAM) 50 MG tablet EXAM: THORACIC SPINE 2 VIEWS  COMPARISON:  None.  FINDINGS: There are mild degenerative changes in the midthoracic spine. No acute fracture or subluxation. Normal alignment. No lytic or blastic lesions. Lungs are clear.  IMPRESSION: Mild degenerative changes.  No evidence for acute  abnormality.   Electronically Signed   By: Nolon Nations M.D.   On: 04/02/2021 14:14   Acute pain of left shoulder 03/31/21 negative could be rotator cuff etiology - Plan: DG Shoulder Left, tiZANidine (ZANAFLEX) 4 MG capsule, traMADol (ULTRAM) 50 MG tablet EXAM: LEFT SHOULDER - 2+ VIEW  COMPARISON:   None.  FINDINGS: There is no evidence of fracture or dislocation. There is no evidence of arthropathy or other focal bone abnormality. Soft tissues are unremarkable.  IMPRESSION: Negative.   Electronically Signed   By: Nolon Nations M.D.   On: 04/02/2021 14:14  Consider ortho referral for above and or pt given exercises to do at home   [facility] 05/2021 consider fasting labs  Flu shotutd Tdap had 03/30/17 Consider pna 23if resumes smoking again shingrix2/2utd covid3/3pfizerconsider booster   Mammogram neg 7/28/2021negativeordered   Pap appears not done saw Dr. Marcelline Mates 04/09/18 per pt had pap per but Dr. Dessie Coma notes no pap s/p hysterectomy no h/o abnormal papand no need further pap  Colonoscopy h/o polyps need to get record from [facility] last colonoscopy in [place] and colonoscopy -05/19/20 poor prep 1 polyp repeat in 6 months serrated polyp [facility]  -discuss with pt 05/13/2021 f/u   DEXA consider in future with h/o hysterectomy  Thyroid US no further w/u 09/2019  -of note also need echo report-2nd request Smoker 2 cig/qd stopped7 or 8/2020started in 20slight smoker  rec healthy diet and exercise  Provider: Dr. Olivia Mackie McLean-Scocuzza-Internal Medicine

## 2021-04-01 ENCOUNTER — Telehealth: Payer: Self-pay | Admitting: Internal Medicine

## 2021-04-01 NOTE — Telephone Encounter (Signed)
Patient called in about her x-ray results

## 2021-04-01 NOTE — Telephone Encounter (Signed)
Left message that results are not in as of yet.   If Patient calling back in okay to inform that we have not received these from radiology as of yet

## 2021-04-01 NOTE — Telephone Encounter (Signed)
Patient calling back in and informed by front desk

## 2021-04-04 DIAGNOSIS — M47812 Spondylosis without myelopathy or radiculopathy, cervical region: Secondary | ICD-10-CM | POA: Insufficient documentation

## 2021-04-04 DIAGNOSIS — M47814 Spondylosis without myelopathy or radiculopathy, thoracic region: Secondary | ICD-10-CM | POA: Insufficient documentation

## 2021-04-04 MED ORDER — ESCITALOPRAM OXALATE 10 MG PO TABS: 10.0000 mg | ORAL_TABLET | Freq: Every day | ORAL | 3 refills | Status: DC

## 2021-04-04 MED ORDER — PANTOPRAZOLE SODIUM 40 MG PO TBEC: 40.0000 mg | DELAYED_RELEASE_TABLET | Freq: Every day | ORAL | 3 refills | Status: DC

## 2021-04-04 MED ORDER — ALPRAZOLAM 0.25 MG PO TABS: 0.2500 mg | ORAL_TABLET | Freq: Every evening | ORAL | 5 refills | Status: DC | PRN

## 2021-04-04 MED ORDER — POTASSIUM CHLORIDE CRYS ER 20 MEQ PO TBCR: 20.0000 meq | EXTENDED_RELEASE_TABLET | Freq: Every day | ORAL | 3 refills | Status: DC

## 2021-04-04 MED ORDER — HYDROCHLOROTHIAZIDE 12.5 MG PO TABS: 12.5000 mg | ORAL_TABLET | Freq: Every day | ORAL | 3 refills | Status: DC

## 2021-04-04 MED ORDER — TELMISARTAN 20 MG PO TABS: 20.0000 mg | ORAL_TABLET | Freq: Every day | ORAL | 3 refills | Status: DC

## 2021-04-04 MED ORDER — LEVOCETIRIZINE DIHYDROCHLORIDE 5 MG PO TABS: 5.0000 mg | ORAL_TABLET | Freq: Every evening | ORAL | 3 refills | Status: DC

## 2021-04-04 NOTE — Addendum Note (Signed)
Addended by: Orland Mustard on: 04/04/2021 05:45 PM   Modules accepted: Orders

## 2021-04-05 ENCOUNTER — Telehealth: Payer: Self-pay | Admitting: Internal Medicine

## 2021-04-05 NOTE — Telephone Encounter (Signed)
To get PT she would need to see the doctor ortho I think good idea to see both

## 2021-04-05 NOTE — Telephone Encounter (Signed)
Just FYI I called and explained to patient. She was okay with both & seeing ortho first.

## 2021-04-05 NOTE — Telephone Encounter (Signed)
Left message for patient to return call back. Also stated I would send note to PCP.

## 2021-04-05 NOTE — Telephone Encounter (Signed)
Patient called and wanted speak with Fransisco Beau. Patient is confused about a referral that was put in for [facility]. She wanted PT and it looks like she was scheduled to see a specialist. She would like Fransisco Beau to call her back at [phone].

## 2021-04-08 ENCOUNTER — Ambulatory Visit: Payer: BC Managed Care – PPO | Admitting: Internal Medicine

## 2021-04-19 ENCOUNTER — Other Ambulatory Visit: Payer: Self-pay | Admitting: Internal Medicine

## 2021-04-19 DIAGNOSIS — J309 Allergic rhinitis, unspecified: Secondary | ICD-10-CM

## 2021-05-13 ENCOUNTER — Other Ambulatory Visit: Payer: Self-pay

## 2021-05-13 ENCOUNTER — Encounter: Payer: Self-pay | Admitting: Internal Medicine

## 2021-05-13 ENCOUNTER — Ambulatory Visit: Payer: BC Managed Care – PPO | Admitting: Internal Medicine

## 2021-05-13 VITALS — BP 126/78 | HR 70 | Temp 98.6°F | Ht 64.0 in | Wt 205.0 lb

## 2021-05-13 DIAGNOSIS — K219 Gastro-esophageal reflux disease without esophagitis: Secondary | ICD-10-CM

## 2021-05-13 DIAGNOSIS — L739 Follicular disorder, unspecified: Secondary | ICD-10-CM

## 2021-05-13 DIAGNOSIS — K635 Polyp of colon: Secondary | ICD-10-CM

## 2021-05-13 DIAGNOSIS — F419 Anxiety disorder, unspecified: Secondary | ICD-10-CM

## 2021-05-13 DIAGNOSIS — M62838 Other muscle spasm: Secondary | ICD-10-CM | POA: Diagnosis not present

## 2021-05-13 DIAGNOSIS — I10 Essential (primary) hypertension: Secondary | ICD-10-CM

## 2021-05-13 DIAGNOSIS — Z1211 Encounter for screening for malignant neoplasm of colon: Secondary | ICD-10-CM

## 2021-05-13 DIAGNOSIS — E876 Hypokalemia: Secondary | ICD-10-CM

## 2021-05-13 DIAGNOSIS — M549 Dorsalgia, unspecified: Secondary | ICD-10-CM | POA: Diagnosis not present

## 2021-05-13 DIAGNOSIS — M542 Cervicalgia: Secondary | ICD-10-CM

## 2021-05-13 DIAGNOSIS — R102 Pelvic and perineal pain: Secondary | ICD-10-CM

## 2021-05-13 DIAGNOSIS — J309 Allergic rhinitis, unspecified: Secondary | ICD-10-CM

## 2021-05-13 DIAGNOSIS — M25512 Pain in left shoulder: Secondary | ICD-10-CM

## 2021-05-13 MED ORDER — TIZANIDINE HCL 4 MG PO CAPS: 4.0000 mg | ORAL_CAPSULE | Freq: Every evening | ORAL | 3 refills | Status: DC | PRN

## 2021-05-13 MED ORDER — PANTOPRAZOLE SODIUM 40 MG PO TBEC: 40.0000 mg | DELAYED_RELEASE_TABLET | Freq: Every day | ORAL | 3 refills | Status: DC

## 2021-05-13 MED ORDER — LEVOCETIRIZINE DIHYDROCHLORIDE 5 MG PO TABS
5.0000 mg | ORAL_TABLET | Freq: Every evening | ORAL | 3 refills | Status: DC
Start: 2021-05-13 — End: 2022-04-28

## 2021-05-13 MED ORDER — ALPRAZOLAM 0.25 MG PO TABS: 0.2500 mg | ORAL_TABLET | Freq: Every evening | ORAL | 5 refills | Status: DC | PRN

## 2021-05-13 MED ORDER — MOMETASONE FUROATE 50 MCG/ACT NA SUSP: NASAL | 3 refills | Status: DC

## 2021-05-13 MED ORDER — DOXYCYCLINE HYCLATE 100 MG PO TABS: 100.0000 mg | ORAL_TABLET | Freq: Two times a day (BID) | ORAL | 0 refills | Status: DC

## 2021-05-13 MED ORDER — TELMISARTAN 20 MG PO TABS: 20.0000 mg | ORAL_TABLET | Freq: Every day | ORAL | 3 refills | Status: DC

## 2021-05-13 MED ORDER — TIZANIDINE HCL 4 MG PO CAPS: 4.0000 mg | ORAL_CAPSULE | Freq: Every evening | ORAL | 6 refills | Status: DC | PRN

## 2021-05-13 MED ORDER — POTASSIUM CHLORIDE CRYS ER 20 MEQ PO TBCR: 20.0000 meq | EXTENDED_RELEASE_TABLET | Freq: Every day | ORAL | 3 refills | Status: DC

## 2021-05-13 MED ORDER — HYDROCHLOROTHIAZIDE 12.5 MG PO TABS: 12.5000 mg | ORAL_TABLET | Freq: Every day | ORAL | 3 refills | Status: DC

## 2021-05-13 MED ORDER — ESCITALOPRAM OXALATE 10 MG PO TABS: 10.0000 mg | ORAL_TABLET | Freq: Every day | ORAL | 3 refills | Status: DC

## 2021-05-13 NOTE — Patient Instructions (Addendum)
Aspercream with lidocaine for body aches   Folliculitis  Folliculitis is inflammation of the hair follicles. Folliculitis most commonly occurs on the scalp, thighs, legs, back, and buttocks. However, it can occuranywhere on the body. What are the causes? This condition may be caused by: A bacterial infection (common). A fungal infection. A viral infection. Contact with certain chemicals, especially oils and tars. Shaving or waxing. Greasy ointments or creams applied to the skin. Long-lasting folliculitis and folliculitis that keeps coming back may be caused by bacteria. This bacteria can live anywhere on your skin and is often found inthe nostrils. What increases the risk? You are more likely to develop this condition if you have: A weakened immune system. Diabetes. Obesity. What are the signs or symptoms? Symptoms of this condition include: Redness. Soreness. Swelling. Itching. Small white or yellow, pus-filled, itchy spots (pustules) that appear over a reddened area. If there is an infection that goes deep into the follicle, these may develop into a boil (furuncle). A group of closely packed boils (carbuncle). These tend to form in hairy, sweaty areas of the body. How is this diagnosed? This condition is diagnosed with a skin exam. To find what is causing the condition, your health care provider may take a sample of one of the pustulesor boils for testing in a lab. How is this treated? This condition may be treated by: Applying warm compresses to the affected areas. Taking an antibiotic medicine or applying an antibiotic medicine to the skin. Applying or bathing with an antiseptic solution. Taking an over-the-counter medicine to help with itching. Having a procedure to drain any pustules or boils. This may be done if a pustule or boil contains a lot of pus or fluid. Having laser hair removal. This may be done to treat long-lasting folliculitis. Follow these instructions at  home: Managing pain and swelling  If directed, apply heat to the affected area as often as told by your health care provider. Use the heat source that your health care provider recommends, such as a moist heat pack or a heating pad. Place a towel between your skin and the heat source. Leave the heat on for 20-30 minutes. Remove the heat if your skin turns bright red. This is especially important if you are unable to feel pain, heat, or cold. You may have a greater risk of getting burned.  General instructions If you were prescribed an antibiotic medicine, take it or apply it as told by your health care provider. Do not stop using the antibiotic even if your condition improves. Check the irritated area every day for signs of infection. Check for: Redness, swelling, or pain. Fluid or blood. Warmth. Pus or a bad smell. Do not shave irritated skin. Take over-the-counter and prescription medicines only as told by your health care provider. Keep all follow-up visits as told by your health care provider. This is important. Get help right away if: You have more redness, swelling, or pain in the affected area. Red streaks are spreading from the affected area. You have a fever. Summary Folliculitis is inflammation of the hair follicles. Folliculitis most commonly occurs on the scalp, thighs, legs, back, and buttocks. This condition may be treated by taking an antibiotic medicine or applying an antibiotic medicine to the skin, and applying or bathing with an antiseptic solution. If you were prescribed an antibiotic medicine, take it or apply it as told by your health care provider. Do not stop using the antibiotic even if your condition improves. Get help  right away if you have new or worsening symptoms. Keep all follow-up visits as told by your health care provider. This is important. This information is not intended to replace advice given to you by your health care provider. Make sure you discuss  any questions you have with your healthcare provider. Document Revised: 05/31/2018 Document Reviewed: 06/08/2018 Elsevier Patient Education  2022 [place].  Skin Abscess  A skin abscess is an infected area on or under your skin that contains a collection of pus and other material. An abscess may also be called a furuncle,carbuncle, or boil. An abscess can occur in or on almost any part of your body. Some abscesses break open (rupture) on their own. Most continue to get worse unless they are treated. The infection can spread deeper into the body and eventually into your blood, whichcan make you feel ill. Treatment usually involves draining the abscess. What are the causes? An abscess occurs when germs, like bacteria, pass through your skin and cause an infection. This may be caused by: A scrape or cut on your skin. A puncture wound through your skin, including a needle injection or insect bite. Blocked oil or sweat glands. Blocked and infected hair follicles. A cyst that forms beneath your skin (sebaceous cyst) and becomes infected. What increases the risk? This condition is more likely to develop in people who: Have a weak body defense system (immune system). Have diabetes. Have dry and irritated skin. Get frequent injections or use illegal IV drugs. Have a foreign body in a wound, such as a splinter. Have problems with their lymph system or veins. What are the signs or symptoms? Symptoms of this condition include: A painful, firm bump under the skin. A bump with pus at the top. This may break through the skin and drain. Other symptoms include: Redness surrounding the abscess site. Warmth. Swelling of the lymph nodes (glands) near the abscess. Tenderness. A sore on the skin. How is this diagnosed? This condition may be diagnosed based on: A physical exam. Your medical history. A sample of pus. This may be used to find out what is causing the infection. Blood tests. Imaging  tests, such as an ultrasound, CT scan, or MRI. How is this treated? A small abscess that drains on its own may not need treatment. Treatment for larger abscesses may include: Moist heat or heat pack applied to the area several times a day. A procedure to drain the abscess (incision and drainage). Antibiotic medicines. For a severe abscess, you may first get antibiotics through an IV and then change to antibiotics by mouth. Follow these instructions at home: Medicines  Take over-the-counter and prescription medicines only as told by your health care provider. If you were prescribed an antibiotic medicine, take it as told by your health care provider. Do not stop taking the antibiotic even if you start to feel better.  Abscess care  If you have an abscess that has not drained, apply heat to the affected area. Use the heat source that your health care provider recommends, such as a moist heat pack or a heating pad. Place a towel between your skin and the heat source. Leave the heat on for 20-30 minutes. Remove the heat if your skin turns bright red. This is especially important if you are unable to feel pain, heat, or cold. You may have a greater risk of getting burned. Follow instructions from your health care provider about how to take care of your abscess. Make sure you: Cover  the abscess with a bandage (dressing). Change your dressing or gauze as told by your health care provider. Wash your hands with soap and water before you change the dressing or gauze. If soap and water are not available, use hand sanitizer. Check your abscess every day for signs of a worsening infection. Check for: More redness, swelling, or pain. More fluid or blood. Warmth. More pus or a bad smell.  General instructions To avoid spreading the infection: Do not share personal care items, towels, or hot tubs with others. Avoid making skin contact with other people. Keep all follow-up visits as told by your  health care provider. This is important. Contact a health care provider if you have: More redness, swelling, or pain around your abscess. More fluid or blood coming from your abscess. Warm skin around your abscess. More pus or a bad smell coming from your abscess. A fever. Muscle aches. Chills or a general ill feeling. Get help right away if you: Have severe pain. See red streaks on your skin spreading away from the abscess. Summary A skin abscess is an infected area on or under your skin that contains a collection of pus and other material. A small abscess that drains on its own may not need treatment. Treatment for larger abscesses may include having a procedure to drain the abscess and taking an antibiotic. This information is not intended to replace advice given to you by your health care provider. Make sure you discuss any questions you have with your healthcare provider. Document Revised: 02/20/2019 Document Reviewed: 12/13/2017 Elsevier Patient Education  2022 [employer].

## 2021-05-13 NOTE — Progress Notes (Addendum)
Chief Complaint  Patient presents with   Follow-up   F/u  1. Htn controlled on hctz 12.5 mg qd  2. Anxiety depression controlled on xanax 0.25 qd prn and lexapro 10 mg qd wants refills of all meds  3. ? Wart left labia and ? Inflamed hair follicle right labia will f/u Dr. Marcelline Mates to consider bx and removal  4. ENT saw and 05/24/21 she had imaging of her sinuses 5. [facility] pain appt today with Dr. Dor[facility] zanaflex is helping    Review of Systems  Constitutional:  Negative for weight loss.  HENT:  Negative for hearing loss.   Eyes:  Negative for blurred vision.  Respiratory:  Negative for shortness of breath.   Cardiovascular:  Positive for chest pain.  Gastrointestinal:  Negative for abdominal pain.  Musculoskeletal:  Positive for back pain, joint pain and neck pain. Negative for falls.  Skin:  Negative for rash.  Neurological:  Negative for headaches.  Psychiatric/Behavioral:  Negative for depression.   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   Colon polyp 2014   Enlarged thyroid    Genital warts    inner labia minora   Hypertension    UTI (urinary tract infection)    Past Surgical History:  Procedure Laterality Date   ABDOMINAL HYSTERECTOMY     2009   BREAST BIOPSY Right 2003   CARPAL TUNNEL RELEASE     COLONOSCOPY WITH PROPOFOL N/A 05/18/2020   Procedure: COLONOSCOPY WITH PROPOFOL;  Surgeon: Jonathon Bellows, MD;  Location: [facility] ENDOSCOPY;  Service: Gastroenterology;  Laterality: N/A;   COLONOSCOPY WITH PROPOFOL N/A 05/19/2020   Procedure: COLONOSCOPY WITH PROPOFOL;  Surgeon: Jonathon Bellows, MD;  Location: [facility] ENDOSCOPY;  Service: Gastroenterology;  Laterality: N/A;   FOOT SURGERY     right   TONSILLECTOMY     1987   UNILATERAL SALPINGECTOMY     left   Family History  Problem Relation Age of Onset   Hypertension Mother    Hyperlipidemia Mother    Hypertension Father     Breast cancer Neg Hx    Ovarian cancer Neg Hx    Stroke Neg Hx    Thyroid disease Neg Hx    Social History   Socioeconomic History   Marital status: Married    Spouse name: Not on file   Number of children: Not on file   Years of education: Not on file   Highest education level: Not on file  Occupational History   Not on file  Tobacco Use   Smoking status: Former    Packs/day: 0.25    Pack years: 0.00    Types: Cigarettes    Quit date: 2020    Years since quitting: 2.4   Smokeless tobacco: Never  Vaping Use   Vaping Use: Never used  Substance and Sexual Activity   Alcohol use: Yes    Comment: socially   Drug use: No   Sexual activity: Yes    Birth control/protection: Surgical  Other Topics Concern   Not on file  Social History Narrative   Married    Adopted 6 y.o daughter as of 07/22/18    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   Cholecalciferol ([employer] D3) 125 MCG (5000 UT) capsule Take 1 capsule (5,000 Units total) by mouth daily.   cyanocobalamin 100 MCG tablet Take 100 mcg by mouth daily.   doxycycline (VIBRA-TABS) 100 MG tablet Take 1 tablet (100 mg total) by mouth 2 (two) times daily. With food   Multiple Vitamin (MULTIVITAMIN) capsule Take by mouth daily.    Nutritional Supplements (ESTROVEN PO) Take by mouth.    [DISCONTINUED] ALPRAZolam (XANAX) 0.25 MG tablet Take 1 tablet (0.25 mg total) by mouth at bedtime as needed.   [DISCONTINUED] escitalopram (LEXAPRO) 10 MG  tablet Take 1 tablet (10 mg total) by mouth daily.   [DISCONTINUED] hydrochlorothiazide (HYDRODIURIL) 12.5 MG tablet Take 1 tablet (12.5 mg total) by mouth daily. In am   [DISCONTINUED] levocetirizine (XYZAL) 5 MG tablet Take 1 tablet (5 mg total) by mouth every evening.   [DISCONTINUED] mometasone (NASONEX) 50 MCG/ACT nasal spray PLACE 2 SPRAYS INTO THE NOSE DAILY.   [DISCONTINUED] pantoprazole (PROTONIX) 40 MG tablet Take 1 tablet (40 mg total) by mouth daily.   [DISCONTINUED] potassium chloride SA (KLOR-CON) 20 MEQ tablet Take 1 tablet (20 mEq total) by mouth daily.   [DISCONTINUED] telmisartan (MICARDIS) 20 MG tablet Take 1 tablet (20 mg total) by mouth daily.   [DISCONTINUED] tiZANidine (ZANAFLEX) 4 MG capsule Take 1 capsule (4 mg total) by mouth at bedtime as needed for muscle spasms.   Allergies  Allergen Reactions   Losartan Potassium-Hctz     Other reaction(s): Other (See Comments) Weight gain, constipation   Tape Rash   No results found for this or any previous visit (from the past 2160 hour(s)). Objective  Body mass index is 35.19 kg/m. Wt Readings from Last 3 Encounters:  05/13/21 205 lb (93 kg)  03/31/21 208 lb 6.4 oz (94.5 kg)  12/21/20 205 lb (93 kg)   Temp Readings from Last 3 Encounters:  05/13/21 98.6 F (37 C) (Oral)  03/31/21 98.2 F (36.8 C) (Oral)  09/23/20 98.6 F (37 C) (Oral)   BP Readings from Last 3 Encounters:  05/13/21 126/78  03/31/21 116/80  12/21/20 124/78   Pulse Readings from Last 3 Encounters:  05/13/21 70  03/31/21 (!) 59  11/26/20 (!) 59    Physical Exam Vitals and nursing note reviewed.  Constitutional:      Appearance: Normal appearance. She is well-developed and well-groomed. She is obese.  HENT:     Head: Normocephalic and atraumatic.  Eyes:     Conjunctiva/sclera: Conjunctivae normal.     Pupils: Pupils are equal, round, and reactive to light.  Cardiovascular:     Rate and Rhythm: Normal rate and regular rhythm.      Heart sounds: Normal heart sounds. No murmur heard. Pulmonary:     Effort: Pulmonary effort is normal.     Breath sounds: Normal breath sounds.  Genitourinary:   Skin:    General: Skin is warm and dry.  Neurological:     General: No focal deficit present.     Mental Status: She is alert and oriented to person, place,  and time. Mental status is at baseline.     Gait: Gait normal.  Psychiatric:        Attention and Perception: Attention and perception normal.        Mood and Affect: Mood and affect normal.        Speech: Speech normal.        Behavior: Behavior normal. Behavior is cooperative.        Thought Content: Thought content normal.        Cognition and Memory: Cognition and memory normal.        Judgment: Judgment normal.    Assessment  Plan  Cervicalgia - Plan: tiZANidine (ZANAFLEX) 4 MG capsule, DISCONTINUED: tiZANidine (ZANAFLEX) 4 MG capsule Mid back pain - Plan: tiZANidine (ZANAFLEX) 4 MG capsule, DISCONTINUED: tiZANidine (ZANAFLEX) 4 MG capsule Acute pain of left shoulder - Plan: tiZANidine (ZANAFLEX) 4 MG capsule, DISCONTINUED: tiZANidine (ZANAFLEX) 4 MG capsule Muscle spasm - Plan: tiZANidine (ZANAFLEX) 4 MG capsule, DISCONTINUED: tiZANidine (ZANAFLEX) 4 MG capsule Appt today [facility] Dr. Raliegh Ip ahd appt today   Anxiety - Plan: ALPRAZolam (XANAX) 0.25 MG tablet, escitalopram (LEXAPRO) 10 MG tablet  Polyp of colon, unspecified part of colon, unspecified type - Plan: Ambulatory referral to Gastroenterology [facility] GI Screening for colon cancer - Plan: Ambulatory referral to Gastroenterology  Essential hypertension - Plan: telmisartan (MICARDIS) 20 MG tablet, hydrochlorothiazide (HYDRODIURIL) 12.5 MG tablet controlled Hypokalemia - Plan: potassium chloride SA (KLOR-CON) 20 MEQ tablet  Gastroesophageal reflux disease - Plan: pantoprazole (PROTONIX) 40 MG tablet  Allergic rhinitis, unspecified - Plan: mometasone (NASONEX) 50 MCG/ACT nasal spray Allergic rhinitis,  unspecified seasonality, unspecified trigger - Plan: levocetirizine (XYZAL) 5 MG tablet Fu Dr. Ayesha Mohair 03/01/36   Folliculitis - Plan: doxycycline (VIBRA-TABS) 100 MG tablet bid x 10 days if gets more inflamed  right labia and ? VV left labia F/u with Dr. Marcelline Mates for removal/bx  HM-CPE at f/u  05/2021 consider fasting labs  Flu shot utd Tdap had 03/30/17 Consider pna 23 if resumes smoking again shingrix 2/2 utd covid 3/3 pfizer consider booster    Mammogram neg 06/09/2020 negative ordered sch 07/10/21    Pap appears not done saw Dr. Marcelline Mates 04/09/18 per pt had pap per but Dr. Dessie Coma notes no pap s/p hysterectomy no h/o abnormal pap and no need further pap    Colonoscopy h/o polyps need to get record from [facility] last colonoscopy in [place] signed release prev echo and colonoscopy -05/19/20 poor prep 1 polyp repeat in 6 months serrated polyp [facility] -discuss with pt 05/13/2021 f/u due now    DEXA consider in future with h/o hysterectomy    Thyroid US no further w/u 09/2019    -of note also need echo report -2nd request  Smoker 2 cig/qd stopped 7 or 06/2019 started in 20s light smoker    rec healthy diet and exercise  Provider: Dr. Olivia Mackie McLean-Scocuzza-Internal Medicine

## 2021-05-14 ENCOUNTER — Other Ambulatory Visit: Payer: Self-pay | Admitting: Internal Medicine

## 2021-05-14 DIAGNOSIS — I10 Essential (primary) hypertension: Secondary | ICD-10-CM

## 2021-05-17 ENCOUNTER — Telehealth: Payer: Self-pay | Admitting: Obstetrics and Gynecology

## 2021-05-17 NOTE — Telephone Encounter (Signed)
Patient called and requested an apt with cherry 07-08-2021 I got apt scheduled to have a wart removed- she asked me to check with cherry to see how long it will take. Please Advise.

## 2021-05-18 NOTE — Telephone Encounter (Signed)
Pt was informed that it would only take about 15 minutes.

## 2021-05-19 ENCOUNTER — Telehealth: Payer: Self-pay | Admitting: Internal Medicine

## 2021-05-19 NOTE — Telephone Encounter (Signed)
Please advise, okay to change referral? If so I can change this

## 2021-05-19 NOTE — Addendum Note (Signed)
Addended by: Orland Mustard on: 05/19/2021 03:22 PM   Modules accepted: Orders

## 2021-05-19 NOTE — Telephone Encounter (Signed)
Pt wants GI referral [facility]

## 2021-05-19 NOTE — Telephone Encounter (Signed)
Pt wanted a call on why her referral for GI went to [facility] and not [facility] were they had discussed and wanted referral sent to [facility] quickly

## 2021-05-24 ENCOUNTER — Telehealth: Payer: Self-pay | Admitting: Internal Medicine

## 2021-05-24 NOTE — Telephone Encounter (Signed)
Pt declines to see Dr. Jonathon Bellows and wants to see [facility] GI only  Please refer

## 2021-05-24 NOTE — Telephone Encounter (Signed)
"  Rejection Reason - Patient went elsewhere - Pt was last scoped by Dr Vicente Males at [facility] and rcvd a 6 month repeat. Pt to continue with care at [facility], per OA nurse." Kelly Pittman said on May 24, 2021 3:29 PM  Msg from [facility] gastro

## 2021-05-24 NOTE — Telephone Encounter (Signed)
Noted if they state no at [facility] for referral will rec [facility] pt does not want to go back to  GI

## 2021-06-02 NOTE — Progress Notes (Signed)
    GYNECOLOGY PROGRESS NOTE  Subjective:    Patient ID: Kelly Pittman, female    DOB: [birth year], 55 y.o.   MRN: [id number]  HPI  Patient is a 55 y.o. [id number] female who presents for wart removal. Consent form to authorize procedure of wart removal reviewed and signed by patient.   The following portions of the patient's history were reviewed and updated as appropriate: allergies, current medications, past family history, past medical history, past social history, past surgical history, and problem list.  Review of Systems Pertinent items are noted in HPI.   Objective:  Blood pressure 118/75, pulse 60, height 5\' 6"  (1.676 m), weight 207 lb 6.4 oz (94.1 kg). There is no height or weight on file to calculate BMI. General appearance: alert, cooperative, appears stated age, and no distress Pelvic: external genitalia with left labial wart. Rectovaginal septum normal.  Internal pelvic exam not performed   Assessment:   1. Genital warts     Plan:   Genital wart removed today. Discussed diagnosis. Will f/u with pathology.   VULVAR BIOPSY NOTE The indications for vulvar biopsy (rule out neoplasia, establish lichen sclerosus diagnosis) were reviewed.   Risks of the biopsy including pain, bleeding, infection, inadequate specimen, scarring and need for additional procedures  were discussed. The patient stated understanding and agreed to undergo procedure today. Consent was signed,  time out performed.  The patient's vulva was prepped with Betadine. 1% lidocaine was injected into the site of biopsy. A -mm punch biopsy was done, biopsy tissue was picked up with sterile forceps and sterile scissors were used to excise the lesion.  Small bleeding was noted and hemostasis was achieved using silver nitrate sticks.  The patient tolerated the procedure well. Post-procedure instructions  (pelvic rest for one week) were given to the patient. The patient is to call with heavy bleeding, fever greater  than 100.4, foul smelling vaginal discharge or other concerns. The patient will be return to clinic in two weeks for discussion of results.    Rubie Maid, MD [facility]

## 2021-06-03 ENCOUNTER — Ambulatory Visit (INDEPENDENT_AMBULATORY_CARE_PROVIDER_SITE_OTHER): Payer: BC Managed Care – PPO | Admitting: Obstetrics and Gynecology

## 2021-06-03 ENCOUNTER — Other Ambulatory Visit: Payer: Self-pay

## 2021-06-03 ENCOUNTER — Encounter: Payer: Self-pay | Admitting: Obstetrics and Gynecology

## 2021-06-03 ENCOUNTER — Other Ambulatory Visit (HOSPITAL_COMMUNITY)
Admission: RE | Admit: 2021-06-03 | Discharge: 2021-06-03 | Disposition: A | Discharge status: Home / Self Care | Payer: BC Managed Care – PPO | Source: Ambulatory Visit | Attending: Obstetrics and Gynecology | Admitting: Obstetrics and Gynecology

## 2021-06-03 VITALS — BP 118/75 | HR 60 | Ht 66.0 in | Wt 207.4 lb

## 2021-06-03 DIAGNOSIS — A630 Anogenital (venereal) warts: Secondary | ICD-10-CM | POA: Diagnosis present

## 2021-06-03 NOTE — Patient Instructions (Signed)
VULVAR BIOPSY POST-PROCEDURE INSTRUCTIONS  You may take Ibuprofen, Aleve or Tylenol for pain if needed.    You may have a small amount of spotting.  You should wear a mini pad for the next few days.  You may use some topical Neosporin ointment if you would like (over the counter is fine).  You need to call if you have redness around the biopsy site, if there is any unusual draining, if the bleeding is heavy, or if you are concerned.  Shower or bathe as normal  We will call you within one week with results or we will discuss the results at your follow-up appointment if needed.

## 2021-06-07 LAB — SURGICAL PATHOLOGY

## 2021-06-24 ENCOUNTER — Other Ambulatory Visit: Payer: Self-pay | Admitting: Internal Medicine

## 2021-06-24 DIAGNOSIS — M62838 Other muscle spasm: Secondary | ICD-10-CM

## 2021-06-24 DIAGNOSIS — M549 Dorsalgia, unspecified: Secondary | ICD-10-CM

## 2021-06-24 DIAGNOSIS — M25512 Pain in left shoulder: Secondary | ICD-10-CM

## 2021-06-24 DIAGNOSIS — M542 Cervicalgia: Secondary | ICD-10-CM

## 2021-06-27 NOTE — Telephone Encounter (Signed)
PCP ou tof offic eokay to fill Zanaflex for muscle spasm last filled 03/30/21, last 05/14/21?

## 2021-07-07 ENCOUNTER — Ambulatory Visit: Payer: BC Managed Care – PPO | Admitting: Obstetrics and Gynecology

## 2021-07-07 ENCOUNTER — Encounter: Payer: Self-pay | Admitting: Obstetrics and Gynecology

## 2021-07-07 ENCOUNTER — Other Ambulatory Visit: Payer: Self-pay

## 2021-07-07 VITALS — BP 102/62 | HR 61 | Ht 66.0 in | Wt 208.3 lb

## 2021-07-07 DIAGNOSIS — B029 Zoster without complications: Secondary | ICD-10-CM | POA: Diagnosis not present

## 2021-07-07 DIAGNOSIS — D071 Carcinoma in situ of vulva: Secondary | ICD-10-CM | POA: Diagnosis not present

## 2021-07-07 MED ORDER — VALACYCLOVIR HCL 1 G PO TABS: 1000.0000 mg | ORAL_TABLET | Freq: Three times a day (TID) | ORAL | 0 refills | Status: AC

## 2021-07-07 NOTE — Progress Notes (Signed)
GYNECOLOGY PROGRESS NOTE  Subjective:    Patient ID: Kelly Pittman, female    DOB: Jul 07, 1966, 55 y.o.   MRN: [id number]  HPI  Patient is a 55 y.o. [id number] female who presents for discussion of test results. She had a vulvar biopsy performed for a suspected genital wart.    The following portions of the patient's history were reviewed and updated as appropriate: allergies, current medications, past family history, past medical history, past social history, past surgical history, and problem list.  Review of Systems A comprehensive review of systems was negative except for: rash along right arm, vesicular. Developed ~ 5 days ago. Patient notes rash is somewhat itchy. Has been there  Denies any recent skin contact with plants or animals. Has used some topical OTC creams (neosporin and cortisone)    Objective:   Blood pressure 102/62, pulse 61, height '5\' 6"'$  (1.676 m), weight 208 lb 4.8 oz (94.5 kg). Body mass index is 33.62 kg/m. General appearance: alert, cooperative, appears stated age, and no distress Right arm with several small vesicular patches in a row.  Remainder of exam deferred.     Pathology:  Office Visit on 06/03/2021  Component Date Value Ref Range Status   SURGICAL PATHOLOGY 06/03/2021    Final-Edited                   Value:SURGICAL PATHOLOGY CASE: [id number] PATIENT: Kelly Pittman Surgical Pathology Report     Clinical History: genital warts (cm)     FINAL MICROSCOPIC DIAGNOSIS:  A. VULVA, LEFT LABIA MINORA, BIOPSY: - High grade squamous intraepithelial lesion, VIN-III (severe dysplasia/CIS).   GROSS DESCRIPTION:  Specimen is received in formalin and consists of a 0.3 cm piece of tan soft tissue.  The specimen is entirely submitted in 1 cassette.  Craig Staggers 06/06/2021)    Final Diagnosis performed by Vicente Males, MD.   Electronically signed 06/07/2021 Technical component performed at [facility]. [facility], [place] [address]  [place], [place] [address].  Professional component performed at [facility], [place] [address] [place], [place] [address].  Immunohistochemistry Technical component (if applicable) was performed at [facility]. [address], [place], [place], [place] [address].   IMMUNOHISTOCHEMISTRY DISCLAIMER (if applicab                         le): Some of these immunohistochemical stains may have been developed and the performance characteristics determine by [facility]. Some may not have been cleared or approved by the U.S. Food and Drug Administration. The FDA has determined that such clearance or approval is not necessary. This test is used for clinical purposes. It should not be regarded as investigational or for research. This laboratory is certified under the [place] (CLIA-88) as qualified to perform high complexity clinical laboratory testing.  The controls stained appropriately.     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 Herpes zoster without complication     Plan:   VIN III - Reviewed pathology report with patient. Discussed need for further evaluation with colposcopy due to severity of dysplasia. Will schedule for colposcopy in the next several weeks.  Suspected shingles outbreak on arm. Patient with no history of recent contact to any irritants. Has been treating topical agents without relief. Will prescribe Valtrex to assess for improvement.   A total of 15 minutes were spent face-to-face with  the patient during this encounter and over half of that time dealt with counseling and coordination of care.    Rubie Maid, MD [facility]

## 2021-07-08 ENCOUNTER — Ambulatory Visit
Admission: RE | Admit: 2021-07-08 | Discharge: 2021-07-08 | Disposition: A | Discharge status: Home / Self Care | Payer: BC Managed Care – PPO | Source: Ambulatory Visit | Attending: Internal Medicine | Admitting: Internal Medicine

## 2021-07-08 ENCOUNTER — Encounter: Payer: BC Managed Care – PPO | Admitting: Obstetrics and Gynecology

## 2021-07-08 DIAGNOSIS — Z1231 Encounter for screening mammogram for malignant neoplasm of breast: Secondary | ICD-10-CM | POA: Insufficient documentation

## 2021-07-10 ENCOUNTER — Encounter: Payer: Self-pay | Admitting: Obstetrics and Gynecology

## 2021-07-19 ENCOUNTER — Telehealth (INDEPENDENT_AMBULATORY_CARE_PROVIDER_SITE_OTHER): Payer: BC Managed Care – PPO | Admitting: Family Medicine

## 2021-07-19 ENCOUNTER — Encounter: Payer: Self-pay | Admitting: Family Medicine

## 2021-07-19 DIAGNOSIS — R21 Rash and other nonspecific skin eruption: Secondary | ICD-10-CM | POA: Diagnosis not present

## 2021-07-19 MED ORDER — TRIAMCINOLONE ACETONIDE 0.1 % EX CREA: 1.0000 "application " | TOPICAL_CREAM | Freq: Two times a day (BID) | CUTANEOUS | 0 refills | Status: DC

## 2021-07-19 MED ORDER — MUPIROCIN 2 % EX OINT: 1.0000 "application " | TOPICAL_OINTMENT | Freq: Two times a day (BID) | CUTANEOUS | 0 refills | Status: DC

## 2021-07-19 NOTE — Patient Instructions (Signed)
-  I sent the medication(s) we discussed to your pharmacy: Meds ordered this encounter  Medications   triamcinolone cream (KENALOG) 0.1 %    Sig: Apply 1 application topically 2 (two) times daily.    Dispense:  30 g    Refill:  0   mupirocin ointment (BACTROBAN) 2 %    Sig: Apply 1 application topically 2 (two) times daily.    Dispense:  22 g    Refill:  0   Please call your primary care office today to schedule an in person follow-up visit in about 1 week.  Do not use the Neosporin or the Band-Aid any longer.  Can cover with a gauze dressing.  I hope you are feeling better soon!  Seek in person care promptly sooner if your symptoms worsen, new concerns arise or you are not improving with treatment.  It was nice to meet you today. I help [facility] out with telemedicine visits on Tuesdays and Thursdays and am available for visits on those days. If you have any concerns or questions following this visit please schedule a follow up visit with your Primary Care doctor or seek care at a local urgent care clinic to avoid delays in care.

## 2021-07-19 NOTE — Progress Notes (Signed)
Virtual Visit via Video Note  I connected with Kelly Pittman  on 07/19/21 at  3:00 PM EDT by a video enabled telemedicine application and verified that I am speaking with the correct person using two identifiers.  Location patient: home, [place] Location provider:work or home office Persons participating in the virtual visit: patient, provider  I discussed the limitations of evaluation and management by telemedicine and the availability of in person appointments. The patient expressed understanding and agreed to proceed.   HPI:  Acute telemedicine visit for Rash on the R arm: -Onset: 3 weeks ago -Symptoms include: she says it has been an itchy rash that started out looking like "3 pimples" was burning and "hot" on the R arm, vesicular, gyn dx as shingles and treated with valtrex starting after one week of symptoms  -she also has kept it covered and put neopsporin on it -? Some drainage today and looks a little worse, wonders if infected -Denies:fevers, malaise, rash anywhere else, swelling -does work in her flower garden - but no known poison ivy or oak exposure, she thought it was a spider bite initially  ROS: See pertinent positives and negatives per HPI.  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   Colon polyp 2014   Enlarged thyroid    Genital warts    inner labia minora   Hypertension    UTI (urinary tract infection)     Past Surgical History:  Procedure Laterality Date   ABDOMINAL HYSTERECTOMY     2009   BREAST BIOPSY Right 2003   CARPAL TUNNEL RELEASE     COLONOSCOPY WITH PROPOFOL N/A 05/18/2020   Procedure: COLONOSCOPY WITH PROPOFOL;  Surgeon: Jonathon Bellows, MD;  Location: [facility] ENDOSCOPY;  Service: Gastroenterology;  Laterality: N/A;   COLONOSCOPY WITH PROPOFOL N/A 05/19/2020   Procedure: COLONOSCOPY WITH PROPOFOL;  Surgeon: Jonathon Bellows, MD;  Location: [facility] ENDOSCOPY;  Service:  Gastroenterology;  Laterality: N/A;   FOOT SURGERY     right   TONSILLECTOMY     1987   UNILATERAL SALPINGECTOMY     left     Current Outpatient Medications:    ALPRAZolam (XANAX) 0.25 MG tablet, Take 1 tablet (0.25 mg total) by mouth at bedtime as needed., Disp: 30 tablet, Rfl: 5   Cholecalciferol ([facility] D3) 125 MCG (5000 UT) capsule, Take 1 capsule (5,000 Units total) by mouth daily., Disp: 90 capsule, Rfl: 3   cyanocobalamin 100 MCG tablet, Take 100 mcg by mouth daily., Disp: , Rfl:    escitalopram (LEXAPRO) 10 MG tablet, Take 1 tablet (10 mg total) by mouth daily., Disp: 90 tablet, Rfl: 3   hydrochlorothiazide (HYDRODIURIL) 12.5 MG tablet, TAKE 1 TABLET (12.5 MG TOTAL) BY MOUTH DAILY. IN AM, Disp: 90 tablet, Rfl: 3   levocetirizine (XYZAL) 5 MG tablet, Take 1 tablet (5 mg total) by mouth every evening., Disp: 90 tablet, Rfl: 3   mometasone (NASONEX) 50 MCG/ACT nasal spray, PLACE 2 SPRAYS INTO THE NOSE DAILY., Disp: 153 each, Rfl: 3   Multiple Vitamin (MULTIVITAMIN) capsule, Take by mouth daily. , Disp: , Rfl:    mupirocin ointment (BACTROBAN) 2 %, Apply 1 application topically 2 (two) times daily., Disp: 22 g, Rfl: 0   Nutritional Supplements (ESTROVEN PO), Take by mouth. , Disp: , Rfl:    pantoprazole (PROTONIX) 40 MG tablet, Take 1 tablet (40 mg total) by mouth daily., Disp:  90 tablet, Rfl: 3   potassium chloride SA (KLOR-CON) 20 MEQ tablet, Take 1 tablet (20 mEq total) by mouth daily., Disp: 90 tablet, Rfl: 3   telmisartan (MICARDIS) 20 MG tablet, Take 1 tablet (20 mg total) by mouth daily., Disp: 90 tablet, Rfl: 3   tiZANidine (ZANAFLEX) 4 MG capsule, TAKE 1 CAPSULE (4 MG TOTAL) BY MOUTH AT BEDTIME AS NEEDED FOR MUSCLE SPASMS., Disp: 90 capsule, Rfl: 0   triamcinolone cream (KENALOG) 0.1 %, Apply 1 application topically 2 (two) times daily., Disp: 30 g, Rfl: 0  EXAM:  VITALS per patient if applicable:  GENERAL: alert, oriented, appears well and in no acute distress  HEENT:  atraumatic, conjunttiva clear, no obvious abnormalities on inspection of external nose and ears  NECK: normal movements of the head and neck  LUNGS: on inspection no signs of respiratory distress, breathing rate appears normal, no obvious gross SOB, gasping or wheezing  CV: no obvious cyanosis  SKIN: I did let her know that the video visit does limit my ability to examine the skin well, I cannot see the rash very clearly, however she does have a patch of what appears to be papular vesicular lesions on the forearm, with another healing patch slightly proximal to that.  MS: moves all visible extremities without noticeable abnormality  PSYCH/NEURO: pleasant and cooperative, no obvious depression or anxiety, speech and thought processing grossly intact  ASSESSMENT AND PLAN:  Discussed the following assessment and plan:  Rash  -we discussed possible serious and likely etiologies, options for evaluation and workup, limitations of telemedicine visit vs in person visit, treatment, treatment risks and precautions. Pt is agreeable to treatment via telemedicine at this moment.  Query shingles herpes virus type bite, Toxicodendron dermatitis versus other.  The fact that the rash has been more itchy than painful, makes me think more of contact dermatitis or a reaction to an insect bite.  However, the patches do look like shingles or herpes virus.  I do not appreciate signs of secondary bacterial infection, however she feels that the rash maybe has had some oozing.  We opted to try topicals steroid and avoid the Band-Aid and Neosporin and also try topical mupirocin.  Can cover with a gauze dressing.  Advised a 1 week in person follow-up visit with her primary care office.  Advised she request a viral culture if any remaining vesicular lesions at that time. Scheduled follow up with PCP offered: She agrees to contact her primary care office to schedule follow-up. Advised to seek prompt in person care if  worsening, new symptoms arise, or if is not improving with treatment. Discussed options for inperson care if PCP office not available. Did let this patient know that I only do telemedicine on Tuesdays and Thursdays for [facility]. Advised to schedule follow up visit with PCP or [facility] if any further questions or concerns to avoid delays in care.   I discussed the assessment and treatment plan with the patient. The patient was provided an opportunity to ask questions and all were answered. The patient agreed with the plan and demonstrated an understanding of the instructions.     Lucretia Kern, DO

## 2021-08-08 NOTE — Patient Instructions (Signed)
Colposcopy, Care After This sheet gives you information about how to care for yourself after your procedure. Your health care provider may also give you more specific instructions. If you have problems or questions, contact your health care provider. What can I expect after the procedure? If you had a colposcopy without a biopsy, you can expect to feel fine right away after your procedure. However, you may have some spotting of blood for a few days. You can return to your normal activities. If you had a colposcopy with a biopsy, it is common after the procedure to have: Soreness and mild pain. These may last for a few days. Light-headedness. Mild vaginal bleeding or discharge that is dark-colored and grainy. This may last for a few days. The discharge may be caused by a liquid (solution) that was used during the procedure. You may need to wear a sanitary pad during this time. Spotting of blood for at least 48 hours after the procedure. Follow these instructions at home: Medicines Take over-the-counter and prescription medicines only as told by your health care provider. Talk with your health care provider about what type of over-the-counter pain medicine and prescription medicine you can start to take again. It is especially important to talk with your health care provider if you take blood thinners. Activity Limit your physical activity for the first day after your procedure as told by your health care provider. Avoid using douche products, using tampons, or having sex for at least 3 days after the procedure or for as long as told. Return to your normal activities as told by your health care provider. Ask your health care provider what activities are safe for you. General instructions  Drink enough fluid to keep your urine pale yellow. Ask your health care provider if you may take baths, swim, or use a hot tub. You may take showers. If you use birth control (contraception), continue to use  it. Keep all follow-up visits as told by your health care provider. This is important. Contact a health care provider if: You develop a skin rash. Get help right away if: You bleed a lot from your vagina or pass blood clots. This includes using more than one sanitary pad each hour for 2 hours in a row. You have a fever or chills. You have vaginal discharge that is abnormal, is yellow in color, or smells bad. This could be a sign of infection. You have severe pain or cramps in your lower abdomen that do not go away with medicine. You faint. Summary If you had a colposcopy without a biopsy, you can expect to feel fine right away, but you may have some spotting of blood for a few days. You can return to your normal activities. If you had a colposcopy with a biopsy, it is common to have mild pain for a few days and spotting for 48 hours after the procedure. Avoid using douche products, using tampons, and having sex for at least 3 days after the procedure or for as long as told by your health care provider. Get help right away if you have heavy bleeding, severe pain, or signs of infection. This information is not intended to replace advice given to you by your health care provider. Make sure you discuss any questions you have with your health care provider. Document Revised: 10/29/2019 Document Reviewed: 10/29/2019 Elsevier Patient Education  2022 [employer].

## 2021-08-09 ENCOUNTER — Ambulatory Visit (INDEPENDENT_AMBULATORY_CARE_PROVIDER_SITE_OTHER): Payer: BC Managed Care – PPO | Admitting: Obstetrics and Gynecology

## 2021-08-09 ENCOUNTER — Encounter: Payer: Self-pay | Admitting: Obstetrics and Gynecology

## 2021-08-09 ENCOUNTER — Other Ambulatory Visit: Payer: Self-pay

## 2021-08-09 VITALS — BP 121/73 | HR 58 | Ht 66.0 in | Wt 209.1 lb

## 2021-08-09 DIAGNOSIS — D071 Carcinoma in situ of vulva: Secondary | ICD-10-CM

## 2021-08-09 NOTE — Progress Notes (Signed)
     GYNECOLOGY OFFICE COLPOSCOPY PROCEDURE NOTE  55 y.o. 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  Patient gave informed written consent, time out was performed.  Placed in lithotomy position. Cervix viewed with speculum and colposcope after application of acetic acid.   Colposcopy adequate? Yes  no visible lesions and no abnormal vasculature; no biopsies obtained.   All specimens were labeled and sent to pathology.  Patient was given post procedure instructions.  Will follow up in 6 months for repeat colposcopy and manage accordingly; patient will be contacted with results and recommendations.  Routine preventative health maintenance measures emphasized.    Rubie Maid, MD [facility]

## 2021-11-08 ENCOUNTER — Telehealth: Payer: Self-pay | Admitting: Obstetrics and Gynecology

## 2021-11-08 NOTE — Telephone Encounter (Signed)
Pt is calling back to check the status of the previous msg.  Pt is aware that we ask for 24-48 hrs for an msg that are left.  Pt would like to have a call back when it is called in.

## 2021-11-08 NOTE — Telephone Encounter (Signed)
Pt called requesting ABX for UTI- states that she has pressure urinating and pressure an bloating in stomach, pt also requested diflucan as she always gets a yeast infection following abx. Confirmed pharmacy as [facility] in [place]. Please Advise.

## 2021-11-08 NOTE — Telephone Encounter (Signed)
Can she do a urine drop off tomorrow?

## 2021-11-09 NOTE — Telephone Encounter (Signed)
Pt is calling in stating that she would like to check on the status of the below msg.  Pt is aware that she will need to come by the office to drop off a urine sample.  Pt wanted to know when she drop off her urine sample if the medication will be called in at that present time.  Pt has been scheduled and will be in at 4:15 11/10/2021.

## 2021-11-09 NOTE — Telephone Encounter (Signed)
Patient is going to stop by tomorrow and for a urine dip and culture.

## 2021-11-10 ENCOUNTER — Other Ambulatory Visit: Payer: Self-pay

## 2021-11-10 ENCOUNTER — Encounter: Payer: Self-pay | Admitting: Obstetrics and Gynecology

## 2021-11-10 ENCOUNTER — Ambulatory Visit (INDEPENDENT_AMBULATORY_CARE_PROVIDER_SITE_OTHER): Payer: BC Managed Care – PPO | Admitting: Obstetrics and Gynecology

## 2021-11-10 ENCOUNTER — Telehealth: Payer: Self-pay | Admitting: Internal Medicine

## 2021-11-10 VITALS — BP 132/74 | HR 69 | Temp 98.2°F | Ht 64.0 in | Wt 207.2 lb

## 2021-11-10 DIAGNOSIS — R399 Unspecified symptoms and signs involving the genitourinary system: Secondary | ICD-10-CM

## 2021-11-10 LAB — POCT URINALYSIS DIPSTICK
Bilirubin, UA: NEGATIVE
Blood, UA: NEGATIVE
Glucose, UA: NEGATIVE
Ketones, UA: NEGATIVE
Leukocytes, UA: NEGATIVE
Nitrite, UA: NEGATIVE
Protein, UA: NEGATIVE
Spec Grav, UA: 1.02 (ref 1.010–1.025)
Urobilinogen, UA: 0.2 E.U./dL
pH, UA: 6.5 (ref 5.0–8.0)

## 2021-11-10 NOTE — Progress Notes (Signed)
Pt presents for urine drop off complaining of urinary frequency and pressure. No other symptoms verbalized. No abnormal findings on urinalysis dip, normal oral temperature taken. Urine sent off for culture. Advised pt that as soon as results came back we would send in Rx if results deemed necessary. Pt verbalized agreement and understanding. All questions answered.

## 2021-11-10 NOTE — Telephone Encounter (Signed)
Patient Called in need something for UTI, Please call patient [phone]

## 2021-11-11 NOTE — Telephone Encounter (Signed)
Pt has been seen in office for urine dip/dropoff and is awaiting results.

## 2021-11-11 NOTE — Telephone Encounter (Signed)
Noted  

## 2021-11-11 NOTE — Telephone Encounter (Signed)
Patient is having pressure and frequency no burning or odor noted X 1 day. Offered to have patient come in for urine and scheduled for Tuesday patient refused. Patient says she will go to UC and be seen she wants and ABX today.

## 2021-11-14 LAB — URINE CULTURE

## 2021-11-15 ENCOUNTER — Telehealth: Payer: Self-pay | Admitting: Internal Medicine

## 2021-11-15 NOTE — Telephone Encounter (Signed)
Patient called in stated she need a referral to [facility] for pelvic pain. Please contact Patient @ [phone]

## 2021-11-15 NOTE — Progress Notes (Signed)
Pt aware, MyChart request sent & pending.

## 2021-11-16 NOTE — Telephone Encounter (Signed)
Please advise, does Patient need an appointment?

## 2021-11-17 NOTE — Telephone Encounter (Signed)
Pt calling in regards to previous message

## 2021-11-18 NOTE — Telephone Encounter (Signed)
Pelvic pain also rec pt call ob/gyn Dr. Marcelline Mates and make appt  Referral placed pt per request  For further all referrals at time of appt inform pt

## 2021-11-18 NOTE — Addendum Note (Signed)
Addended by: Orland Mustard on: 11/18/2021 04:56 PM   Modules accepted: Orders

## 2021-11-19 NOTE — Telephone Encounter (Signed)
Yes, she can call to make an appointment to see me.

## 2021-11-21 NOTE — Telephone Encounter (Signed)
Patient informed and verbalized understanding

## 2021-11-21 NOTE — Telephone Encounter (Signed)
Inform pt below pelvic pain is ob/gyn issue call Dr. Marcelline Mates please

## 2021-12-03 ENCOUNTER — Other Ambulatory Visit: Payer: Self-pay | Admitting: Family Medicine

## 2021-12-29 NOTE — Patient Instructions (Incomplete)
Breast Self-Awareness °Breast self-awareness is knowing how your breasts look and feel. Doing breast self-awareness is important. It allows you to catch a breast problem early while it is still small and can be treated. All women should do breast self-awareness, including women who have had breast implants. Tell your doctor if you notice a change in your breasts. °What you need: °A mirror. °A well-lit room. °How to do a breast self-exam °A breast self-exam is one way to learn what is normal for your breasts and to check for changes. To do a breast self-exam: °Look for changes ° °Take off all the clothes above your waist. °Stand in front of a mirror in a room with good lighting. °Put your hands on your hips. °Push your hands down. °Look at your breasts and nipples in the mirror to see if one breast or nipple looks different from the other. Check to see if: °The shape of one breast is different. °The size of one breast is different. °There are wrinkles, dips, and bumps in one breast and not the other. °Look at each breast for changes in the skin, such as: °Redness. °Scaly areas. °Look for changes in your nipples, such as: °Liquid around the nipples. °Bleeding. °Dimpling. °Redness. °A change in where the nipples are. °Feel for changes ° °Lie on your back on the floor. °Feel each breast. To do this, follow these steps: °Pick a breast to feel. °Put the arm closest to that breast above your head. °Use your other arm to feel the nipple area of your breast. Feel the area with the pads of your three middle fingers by making small circles with your fingers. For the first circle, press lightly. For the second circle, press harder. For the third circle, press even harder. °Keep making circles with your fingers at the different pressures as you move down your breast. Stop when you feel your ribs. °Move your fingers a little toward the center of your body. °Start making circles with your fingers again, this time going up until  you reach your collarbone. °Keep making up-and-down circles until you reach your armpit. Remember to keep using the three pressures. °Feel the other breast in the same way. °Sit or stand in the tub or shower. °With soapy water on your skin, feel each breast the same way you did in step 2 when you were lying on the floor. °Write down what you find °Writing down what you find can help you remember what to tell your doctor. Write down: °What is normal for each breast. °Any changes you find in each breast, including: °The kind of changes you find. °Whether you have pain. °Size and location of any lumps. °When you last had your menstrual period. °General tips °Check your breasts every month. °If you are breastfeeding, the best time to check your breasts is after you feed your baby or after you use a breast pump. °If you get menstrual periods, the best time to check your breasts is 5-7 days after your menstrual period is over. °With time, you will become comfortable with the self-exam, and you will begin to know if there are changes in your breasts. °Contact a doctor if you: °See a change in the shape or size of your breasts or nipples. °See a change in the skin of your breast or nipples, such as red or scaly skin. °Have fluid coming from your nipples that is not normal. °Find a lump or thick area that was not there before. °Have pain in   your breasts. °Have any concerns about your breast health. °Summary °Breast self-awareness includes looking for changes in your breasts, as well as feeling for changes within your breasts. °Breast self-awareness should be done in front of a mirror in a well-lit room. °You should check your breasts every month. If you get menstrual periods, the best time to check your breasts is 5-7 days after your menstrual period is over. °Let your doctor know of any changes you see in your breasts, including changes in size, changes on the skin, pain or tenderness, or fluid from your nipples that is not  normal. °This information is not intended to replace advice given to you by your health care provider. Make sure you discuss any questions you have with your health care provider. °Document Revised: 06/18/2018 Document Reviewed: 06/18/2018 °Elsevier Patient Education © 2022 [employer] Inc[employer] °Preventive Care 40-64 Years Old, Female °Preventive care refers to lifestyle choices and visits with your health care provider that can promote health and wellness. Preventive care visits are also called wellness exams. °What can I expect for my preventive care visit? °Counseling °Your health care provider may ask you questions about your: °Medical history, including: °Past medical problems. °Family medical history. °Pregnancy history. °Current health, including: °Menstrual cycle. °Method of birth control. °Emotional well-being. °Home life and relationship well-being. °Sexual activity and sexual health. °Lifestyle, including: °Alcohol, nicotine or tobacco, and drug use. °Access to firearms. °Diet, exercise, and sleep habits. °Work and work environment. °Sunscreen use. °Safety issues such as seatbelt and bike helmet use. °Physical exam °Your health care provider will check your: °Height and weight. These may be used to calculate your BMI (body mass index). BMI is a measurement that tells if you are at a healthy weight. °Waist circumference. This measures the distance around your waistline. This measurement also tells if you are at a healthy weight and may help predict your risk of certain diseases, such as type 2 diabetes and high blood pressure. °Heart rate and blood pressure. °Body temperature. °Skin for abnormal spots. °What immunizations do I need? °Vaccines are usually given at various ages, according to a schedule. Your health care provider will recommend vaccines for you based on your age, medical history, and lifestyle or other factors, such as travel or where you work. °What tests do I need? °Screening °Your health care  provider may recommend screening tests for certain conditions. This may include: °Lipid and cholesterol levels. °Diabetes screening. This is done by checking your blood sugar (glucose) after you have not eaten for a while (fasting). °Pelvic exam and Pap test. °Hepatitis B test. °Hepatitis C test. °HIV (human immunodeficiency virus) test. °STI (sexually transmitted infection) testing, if you are at risk. °Lung cancer screening. °Colorectal cancer screening. °Mammogram. Talk with your health care provider about when you should start having regular mammograms. This may depend on whether you have a family history of breast cancer. °BRCA-related cancer screening. This may be done if you have a family history of breast, ovarian, tubal, or peritoneal cancers. °Bone density scan. This is done to screen for osteoporosis. °Talk with your health care provider about your test results, treatment options, and if necessary, the need for more tests. °Follow these instructions at home: °Eating and drinking ° °Eat a diet that includes fresh fruits and vegetables, whole grains, lean protein, and low-fat dairy products. °Take vitamin and mineral supplements as recommended by your health care provider. °Do not drink alcohol if: °Your health care provider tells you not to drink. °You are pregnant,   may be pregnant, or are planning to become pregnant. °If you drink alcohol: °Limit how much you have to 0-1 drink a day. °Know how much alcohol is in your drink. In the [place] one drink equals one 12 oz bottle of beer (355 mL), one 5 oz glass of wine (148 mL), or one 1½ oz glass of hard liquor (44 mL). °Lifestyle °Brush your teeth every morning and night with fluoride toothpaste. Floss one time each day. °Exercise for at least 30 minutes 5 or more days each week. °Do not use any products that contain nicotine or tobacco. These products include cigarettes, chewing tobacco, and vaping devices, such as e-cigarettes. If you need help quitting, ask  your health care provider. °Do not use drugs. °If you are sexually active, practice safe sex. Use a condom or other form of protection to prevent STIs. °If you do not wish to become pregnant, use a form of birth control. If you plan to become pregnant, see your health care provider for a prepregnancy visit. °Take aspirin only as told by your health care provider. Make sure that you understand how much to take and what form to take. Work with your health care provider to find out whether it is safe and beneficial for you to take aspirin daily. °Find healthy ways to manage stress, such as: °Meditation, yoga, or listening to music. °Journaling. °Talking to a trusted person. °Spending time with friends and family. °Minimize exposure to UV radiation to reduce your risk of skin cancer. °Safety °Always wear your seat belt while driving or riding in a vehicle. °Do not drive: °If you have been drinking alcohol. Do not ride with someone who has been drinking. °When you are tired or distracted. °While texting. °If you have been using any mind-altering substances or drugs. °Wear a helmet and other protective equipment during sports activities. °If you have firearms in your house, make sure you follow all gun safety procedures. °Seek help if you have been physically or sexually abused. °What's next? °Visit your health care provider once a year for an annual wellness visit. °Ask your health care provider how often you should have your eyes and teeth checked. °Stay up to date on all vaccines. °This information is not intended to replace advice given to you by your health care provider. Make sure you discuss any questions you have with your health care provider. °Document Revised: 04/27/2021 Document Reviewed: 04/27/2021 °Elsevier Patient Education © 2022 [employer] ° °

## 2021-12-29 NOTE — Progress Notes (Unsigned)
ANNUAL PREVENTATIVE CARE GYNECOLOGY  ENCOUNTER NOTE  Subjective:       Kelly Pittman is a 56 y.o. [id number] female here for a routine annual gynecologic exam. The patient is taking herbal remedies for hormone replacement therapy (OTC Estroven). Patient denies post-menopausal vaginal bleeding. The patient wears seatbelts: yes. The patient participates in regular exercise: occasional. Has the patient ever been transfused or tattooed?: no.    Current complaints: 1.  ***    Gynecologic History No LMP recorded. Patient has had a hysterectomy. Contraception: status post hysterectomy Last Pap: 12/2015. Results were: normal Last mammogram: 07/08/2021. Results were: normal Last Colonoscopy: 05/19/2020: One 10 mm polyp in the descending colon, removed with a cold snare. Resected and retrieved. - The examination was otherwise normal on direct and retroflexion views. Impression: - Repeat colonoscopy in 6 months because the bowel preparation was suboptimal. 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   Colon polyp 2014   Enlarged thyroid    Genital warts    inner labia minora   Hypertension    UTI (urinary tract infection)     Family History  Problem Relation Age of Onset   Hypertension Mother    Hyperlipidemia Mother    Hypertension Father    Breast cancer Neg Hx    Ovarian cancer Neg Hx    Stroke Neg Hx    Thyroid disease Neg Hx     Past Surgical History:  Procedure Laterality Date   ABDOMINAL HYSTERECTOMY     2009   BREAST BIOPSY Right 2003    CARPAL TUNNEL RELEASE     COLONOSCOPY WITH PROPOFOL N/A 05/18/2020   Procedure: COLONOSCOPY WITH PROPOFOL;  Surgeon: Jonathon Bellows, MD;  Location: [facility] ENDOSCOPY;  Service: Gastroenterology;  Laterality: N/A;   COLONOSCOPY WITH PROPOFOL N/A 05/19/2020   Procedure: COLONOSCOPY WITH PROPOFOL;  Surgeon: Jonathon Bellows, MD;  Location: [facility] ENDOSCOPY;  Service: Gastroenterology;  Laterality: N/A;   FOOT SURGERY     right   TONSILLECTOMY     1987   UNILATERAL SALPINGECTOMY     left    Social History   Socioeconomic History   Marital status: Married    Spouse name: Not on file   Number of children: Not on file   Years of education: Not on file   Highest education level: Not on file  Occupational History   Not on file  Tobacco Use   Smoking status: Former    Packs/day: 0.25    Types: Cigarettes    Quit date: 2020    Years since quitting: 3.1   Smokeless tobacco: Never  Vaping Use   Vaping Use: Never used  Substance and Sexual Activity   Alcohol use: Yes    Comment: socially   Drug use: No  Sexual activity: Yes    Birth control/protection: Surgical  Other Topics Concern   Not on file  Social History Narrative   Married    Adopted 45 y.o daughter as 05/13/21   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 30 tablet 5   Cholecalciferol ([employer] D3) 125 MCG (5000 UT) capsule Take 1 capsule (5,000  Units total) by mouth daily. 90 capsule 3   cyanocobalamin 100 MCG tablet Take 100 mcg by mouth daily.     escitalopram (LEXAPRO) 10 MG tablet Take 1 tablet (10 mg total) by mouth daily. 90 tablet 3   hydrochlorothiazide (HYDRODIURIL) 12.5 MG tablet TAKE 1 TABLET (12.5 MG TOTAL) BY MOUTH DAILY. IN AM 90 tablet 3   levocetirizine (XYZAL) 5 MG tablet Take 1 tablet (5 mg total) by mouth every evening. 90 tablet 3   mometasone (NASONEX) 50 MCG/ACT nasal spray PLACE 2 SPRAYS INTO THE NOSE DAILY. 153 each 3   Multiple Vitamin (MULTIVITAMIN) capsule Take by mouth daily.      Nutritional Supplements (ESTROVEN PO) Take by mouth.      pantoprazole (PROTONIX) 40 MG tablet Take 1 tablet (40 mg total) by mouth daily. 90 tablet 3   potassium chloride SA (KLOR-CON) 20 MEQ tablet Take 1 tablet (20 mEq total) by mouth daily. 90 tablet 3   telmisartan (MICARDIS) 20 MG tablet Take 1 tablet (20 mg total) by mouth daily. 90 tablet 3   tiZANidine (ZANAFLEX) 4 MG capsule TAKE 1 CAPSULE (4 MG TOTAL) BY MOUTH AT BEDTIME AS NEEDED FOR MUSCLE SPASMS. 90 capsule 0   triamcinolone cream (KENALOG) 0.1 % APPLY TO AFFECTED AREA TWICE A DAY 454 g 3   No current facility-administered medications on file prior to visit.    Allergies  Allergen Reactions   Losartan Potassium-Hctz     Other reaction(s): Other (See Comments) Weight gain, constipation   Tape Rash      Review of Systems ROS Review of Systems - General ROS: negative for - chills, fatigue, fever, hot flashes, night sweats, weight gain or weight loss Psychological ROS: negative for - anxiety, decreased libido, depression, mood swings, physical abuse or sexual abuse Ophthalmic ROS: negative for - blurry vision, eye pain or loss of vision ENT ROS: negative for - headaches, hearing change, visual changes or vocal changes Allergy and Immunology ROS: negative for - hives, itchy/watery eyes or seasonal allergies Hematological and Lymphatic ROS: negative for -  bleeding problems, bruising, swollen lymph nodes or weight loss Endocrine ROS: negative for - galactorrhea, hair pattern changes, hot flashes, malaise/lethargy, mood swings, palpitations, polydipsia/polyuria, skin changes, temperature intolerance or unexpected weight changes Breast ROS: negative for - new or changing breast lumps or nipple discharge Respiratory ROS: negative for - cough or shortness of breath Cardiovascular ROS:  negative for - chest pain, irregular heartbeat, palpitations or shortness of breath Gastrointestinal ROS: no abdominal pain, change in bowel habits, or black or bloody stools Genito-Urinary ROS: no dysuria, trouble voiding, or hematuria Musculoskeletal ROS: negative for - joint pain or joint stiffness Neurological ROS: negative for - bowel and bladder control changes Dermatological ROS: negative for rash and skin lesion changes   Objective:   There were no vitals taken for this visit. CONSTITUTIONAL: Well-developed, well-nourished female in no acute distress.  PSYCHIATRIC: Normal mood and affect. Normal behavior. Normal judgment and thought content. [place]: Alert and oriented to person, place, and time. Normal muscle tone coordination. No cranial nerve deficit noted. HENT:  Normocephalic, atraumatic, External right and left ear normal. Oropharynx is clear and moist EYES: Conjunctivae and EOM are normal. Pupils are equal, round, and reactive to light. No scleral icterus.  NECK: Normal range of motion, supple, no masses.  Normal thyroid.  SKIN: Skin is warm and dry. No rash noted. Not diaphoretic. No erythema. No pallor. CARDIOVASCULAR: Normal heart rate noted, regular rhythm, no murmur. RESPIRATORY: Clear to auscultation bilaterally. Effort and breath sounds normal, no problems with respiration noted. BREASTS: Symmetric in size. No masses, skin changes, nipple drainage, or lymphadenopathy. ABDOMEN: Soft, normal bowel sounds, no distention noted.  No tenderness,  rebound or guarding.  BLADDER: Normal PELVIC:  Bladder {:311640}  Urethra: {:311719}  Vulva: {:311722}  Vagina: {:311643}  Cervix: {:311644}  Uterus: {:311718}  Adnexa: {:311645}  RV: {Blank multiple:19196::"External Exam NormaI","No Rectal Masses","Normal Sphincter tone"}  MUSCULOSKELETAL: Normal range of motion. No tenderness.  No cyanosis, clubbing, or edema.  2+ distal pulses. LYMPHATIC: No Axillary, Supraclavicular, or Inguinal Adenopathy.   Labs: Lab Results  Component Value Date   WBC 6.2 11/12/2020   HGB 12.8 11/12/2020   HCT 38.2 11/12/2020   MCV 90.5 11/12/2020   PLT 268 11/12/2020    Lab Results  Component Value Date   CREATININE 0.84 09/23/2020   BUN 11 09/23/2020   NA 141 09/23/2020   K 4.0 09/23/2020   CL 104 09/23/2020   CO2 32 09/23/2020    Lab Results  Component Value Date   ALT 10 09/23/2020   AST 15 09/23/2020   ALKPHOS 95 09/23/2020   BILITOT 0.4 09/23/2020    Lab Results  Component Value Date   CHOL 180 09/23/2020   HDL 48.00 09/23/2020   LDLCALC 111 (H) 09/23/2020   TRIG 109.0 09/23/2020   CHOLHDL 4 09/23/2020    Lab Results  Component Value Date   TSH 0.68 09/23/2020    Lab Results  Component Value Date   HGBA1C 5.6 08/16/2018     Assessment:   Annual gynecologic examination 56 y.o. Contraception: status post hysterectomy Obesity 2 Problem List Items Addressed This Visit   None   Plan:  Pap: Not needed Mammogram: Ordered Stool Guaiac Testing:  Ordered Labs: {Blank multiple:19196::"Lipid 1","FBS","TSH","Hemoglobin A1C","Vit D Level""***"} Routine preventative health maintenance measures emphasized: Exercise/Diet/Weight control, Tobacco Warnings, Alcohol/Substance use risks, Stress Management, Peer Pressure Issues, and Safe Sex COVID Vaccination status: UTD Return to [place], MD [facility]

## 2021-12-30 ENCOUNTER — Ambulatory Visit: Payer: BC Managed Care – PPO | Admitting: Internal Medicine

## 2021-12-30 ENCOUNTER — Encounter: Payer: BC Managed Care – PPO | Admitting: Obstetrics and Gynecology

## 2022-01-03 ENCOUNTER — Encounter: Payer: BC Managed Care – PPO | Admitting: Obstetrics and Gynecology

## 2022-01-03 DIAGNOSIS — Z124 Encounter for screening for malignant neoplasm of cervix: Secondary | ICD-10-CM

## 2022-01-12 ENCOUNTER — Telehealth: Payer: Self-pay | Admitting: Internal Medicine

## 2022-01-12 NOTE — Telephone Encounter (Signed)
Need copy of colonoscopy thank you ? ?

## 2022-01-18 ENCOUNTER — Ambulatory Visit: Payer: BC Managed Care – PPO | Admitting: Podiatry

## 2022-01-18 ENCOUNTER — Encounter: Payer: Self-pay | Admitting: Podiatry

## 2022-01-18 ENCOUNTER — Ambulatory Visit (INDEPENDENT_AMBULATORY_CARE_PROVIDER_SITE_OTHER): Payer: BC Managed Care – PPO

## 2022-01-18 ENCOUNTER — Other Ambulatory Visit: Payer: Self-pay

## 2022-01-18 ENCOUNTER — Other Ambulatory Visit: Payer: Self-pay | Admitting: Podiatry

## 2022-01-18 DIAGNOSIS — S63509A Unspecified sprain of unspecified wrist, initial encounter: Secondary | ICD-10-CM | POA: Insufficient documentation

## 2022-01-18 DIAGNOSIS — M722 Plantar fascial fibromatosis: Secondary | ICD-10-CM

## 2022-01-18 MED ORDER — TRIAMCINOLONE ACETONIDE 40 MG/ML IJ SUSP
20.0000 mg | Freq: Once | INTRAMUSCULAR | Status: AC
  Administered 2022-01-18: 20 mg

## 2022-01-18 MED ORDER — MELOXICAM 15 MG PO TABS: 15.0000 mg | ORAL_TABLET | Freq: Every day | ORAL | 3 refills | Status: DC

## 2022-01-19 NOTE — Progress Notes (Signed)
She presents today complaining of pain to the left heel.  States is aching x2 weeks morning pain is the worst she has tried ice and stretching and ibuprofen also no avail. ? ?Objective: Vital signs are stable she is alert and oriented x3.  Pulses are palpable.  She has pain on palpation medial calcaneal tubercle small plantar distally oriented calcaneal heel spur mild pes planus.  Mild early osteoarthritic changes at the first metatarsal phalangeal joint and hallux interphalangeal is identified. ? ?She has pain on palpation of the heel primarily indicating Planter fasciitis. ? ?Assessment: Planter fasciitis left. ? ?Plan: Offered her Medrol Dosepak she declined so we injected the left heel 20 mg Kenalog 5 mg Marcaine point of maximal tenderness.  Started her on meloxicam 15 mg 1 p.o. daily placed her in a plantar fascial brace and a night splint.  Discussed appropriate shoe gear stretching exercises ice therapy and shoe gear modifications. ? ?I will follow-up with her in 1 month.  The next time she comes in we will readdress the electrical type pain numbness tightness she has over the lateral aspect of the right foot. ?

## 2022-01-24 ENCOUNTER — Ambulatory Visit: Payer: BC Managed Care – PPO | Admitting: Physical Therapy

## 2022-01-31 ENCOUNTER — Encounter: Payer: BC Managed Care – PPO | Admitting: Physical Therapy

## 2022-02-02 ENCOUNTER — Other Ambulatory Visit: Payer: Self-pay

## 2022-02-02 ENCOUNTER — Ambulatory Visit: Payer: BC Managed Care – PPO | Attending: Internal Medicine | Admitting: Physical Therapy

## 2022-02-02 DIAGNOSIS — R278 Other lack of coordination: Secondary | ICD-10-CM | POA: Insufficient documentation

## 2022-02-02 DIAGNOSIS — R102 Pelvic and perineal pain: Secondary | ICD-10-CM | POA: Insufficient documentation

## 2022-02-02 DIAGNOSIS — R2689 Other abnormalities of gait and mobility: Secondary | ICD-10-CM | POA: Diagnosis present

## 2022-02-02 DIAGNOSIS — M722 Plantar fascial fibromatosis: Secondary | ICD-10-CM | POA: Insufficient documentation

## 2022-02-02 NOTE — Patient Instructions (Addendum)
Bridges with points of feet and back of head and shoulders down  ?Shin perpendicular  ?30 reps  ? ?___ ? ? ? ?Clam Shell 45 Degrees ? ?Lying with hips and knees bent 45?, one pillow between knees and ankles. Heel together, toes apart like ballerina,  Lift knee with exhale while pressing heels together. Be sure pelvis does not roll backward. Do not arch back. ?Do 30 times, each leg, 2 times per day. ? ? ? ?Complimentary stretch: ?Figure-4  ? ?3 breaths   ? ?___ ? ?Deep core level 1-2 ( handout)  ?Inhale soft at ribs  ? ?________________ ? ?Feet slides : ?  ?Points of contact at sitting bones  ?Four points of contact of foot,  ?Side knee back while keeping knee out along 2-3rd toe line  ? ?Heel up, ankle not twist out ?Lower heel while keeping knee out along 2-3rd toe line ?Four points of contact of foot, ?Slide foot back while keeping knee out along 2-3rd toe line ?  ?Repeated with other foot  ?  ?___ ? ?Walking with higher knees, more ballmounds  ? ?

## 2022-02-03 NOTE — Therapy (Signed)
[place] [facility] ?[place], [place], [address] ?Phone: [phone]   Fax:  [phone] ? ?Physical Therapy Evaluation ? ?Patient Details  ?Name: Kelly Pittman ?MRN: [id number] ?Date of Birth: 1966/01/22 ?Referring Provider (PT): McLean-Scocuzza ? ? ?Encounter Date: 02/02/2022 ? ? PT End of Session - 02/02/22 1809   ? ? Visit Number 1   ? Number of Visits 10   ? Date for PT Re-Evaluation 04/13/22   ? PT Start Time 1660   ? PT Stop Time 6301   ? PT Time Calculation (min) 63 min   ? ?  ?  ? ?  ? ? ?Past Medical History:  ?Diagnosis Date  ? Abnormal thyroid function test   ? Anxiety   ? Balanced chromosomal translocation   ? chromosomes 2 and 7  ? Colon polyp 2014  ? Enlarged thyroid   ? Genital warts   ? inner labia minora  ? Hypertension   ? UTI (urinary tract infection)   ? ? ?Past Surgical History:  ?Procedure Laterality Date  ? ABDOMINAL HYSTERECTOMY    ? 2009  ? BREAST BIOPSY Right 2003  ? CARPAL TUNNEL RELEASE    ? COLONOSCOPY WITH PROPOFOL N/A 05/18/2020  ? Procedure: COLONOSCOPY WITH PROPOFOL;  Surgeon: Jonathon Bellows, MD;  Location: [facility] ENDOSCOPY;  Service: Gastroenterology;  Laterality: N/A;  ? COLONOSCOPY WITH PROPOFOL N/A 05/19/2020  ? Procedure: COLONOSCOPY WITH PROPOFOL;  Surgeon: Jonathon Bellows, MD;  Location: [facility] ENDOSCOPY;  Service: Gastroenterology;  Laterality: N/A;  ? FOOT SURGERY    ? right  ? TONSILLECTOMY    ? 1987  ? UNILATERAL SALPINGECTOMY    ? left  ? ? ?There were no vitals filed for this visit. ? ? ? Subjective Assessment - 02/02/22 1717   ? ? Subjective 1) Pelvic pain: Pt noticed pelvic pain returning since 3 months ago. The pelvic pain was constant. It is located on the L pubic bone. It does not radiate anywhere. This pain is the same as it was before when she did Pelvic PT 5 years ago. Pt waited for a long time for her PT appt. Pt started to do some stretches it for one month and pain went down from 8-9/10 to 0/10 for across  3 weeks. Today, she has a little bit of pain.  Pt did the clamshells, bridges, deep core level 2.   ? ?2) Plantar fascitiis pain: it started 1 month ago in L heel. Pt got an injection.  Denied falls onto tailbone, LBP, bowel issues.  3) Urge incontinence: pt feels she is not able to hold urine and has leaked before getting there.   ? ?  ?  ? ?  ? ? ? ? ? [facility] PT Assessment - 02/03/22 0915   ? ?  ? Assessment  ? Medical Diagnosis pelvic pain   ? Referring Provider (PT) McLean-Scocuzza   ?  ? Precautions  ? Precautions None   ?  ? Restrictions  ? Weight Bearing Restrictions No   ?  ? Balance Screen  ? Has the patient fallen in the past 6 months No   ?  ? Observation/Other Assessments  ? Observations reviewed the stretches she was doing on onher own, noted poor technique with bridges with overuse of gluts/ poor co-activaitonof feet, poor alignment with clamshell no use of pillow between knees,   ?  ? Strength  ? Overall Strength Comments hip flex/ knee flex.ext 5/5,  DF/EV L  3+/5, L 4/5, PF/INV OKC 4/5 B   ?  ? Palpation  ? Palpation comment hypomobile L foot, limited in DF/EV, toe abduction   ?  ? Ambulation/Gait  ? Gait Comments heel striking, limited knee / hip flexion, short strides   ? ?  ?  ? ?  ? ? ? ? ? ? ? ? ? ? ? ? ? ?Objective measurements completed on examination: See above findings.  ? ? ? Pelvic Floor Special Questions - 02/03/22 0947   ? ? Diastasis Recti neg   ? External Perineal Exam through clothing, no tenderness at L pubic area, not tightness L   ? ?  ?  ? ?  ? ? ? [facility] Adult PT Treatment/Exercise - 02/03/22 0915   ? ?  ? Neuro Re-ed   ? Neuro Re-ed Details  cued for increased hip fleixon. knee flexion and more midfoot contact / toe abduction  in gait, cued for feet mobility HEP, cued for proper alignment with bridges, clamshells, deep core levle 1-2   ?  ? Manual Therapy  ? Manual therapy comments STM/MWM at L midfoot to promote DF/EV, toe abduction   ? ?  ?  ? ?  ? ? ? ? ? ? ? ? ? ? ? ? ? ? ? PT  Long Term Goals - 02/03/22 1002   ? ?  ? PT LONG TERM GOAL #1  ? Title Pt will demo increased toe abduction/ foot mobility, and stronger DF/EV strength on L from 3/5 to > 4/5 in order to minimize L plantar fascitiis an   ? Time 8   ? Period Weeks   ? Status New   ? Target Date 03/31/22   ?  ? PT LONG TERM GOAL #2  ? Title Pt will demo IND with proper technique and alignment with bridge, clamshells, figure -4 stretch, and deep core coordination in order to slef-manage and reduce L pelvic pain   ? Time 4   ? Period Weeks   ? Status New   ? Target Date 03/03/22   ?  ? PT LONG TERM GOAL #3  ? Title Pt will demo imnproved gait mechanics with longer stride, increased hip/ knee flexion, more midfoot strike in order to minimize plantar fascitiis and pelvic pain   ? Time 8   ? Period Weeks   ? Status New   ? Target Date 03/31/22   ? ?  ?  ? ?  ? ? ? ? ? ? ? ? ? Plan - 02/03/22 1000   ? ? Clinical Impression Statement  ?Pt is a  56  yo  who presents with L pelvic pain and L plantar fascitis which impact her QOL.  ? ?Pt's musculoskeletal assessment revealed weaker DF/EV strength on L foot, limited toe abduction and DF/EV ROM, limited feet mobility in gait with short strides and limited hip flex/knee flexion in swing phase. Pt also showed poor form and technique in exercises she has started to do again. Although these exercises helped with her pelvic pain, it is important for her to know how to do them correctly to minimize pelvic pain/ tightness. These were exercises she recalled form past Pelvic PT Tx from years ago.  ? ?Pt will benefit from coordination training and education on fitness and functional positions. ? ?Regional interdependent approaches will yield greater benefits in pt's POC given the relationship of pelvic pain and lower kinetic chain deficits.  ? ?Following Tx today which pt tolerated  without complaints, pt demo'd increased feet mobility, improved gait with cues. Pt also demo'd IND and correct form to  bridges, clamshells, figure-4 stretch, and deep core level 1-2.   ? ?Pt continues to benefit form skilled PT.  ?  ? Stability/Clinical Decision Making Evolving/Moderate complexity   ? Clinical Decision Making Moderate   ? Rehab Potential Good   ? PT Frequency 1x / week   ? PT Duration Other (comment)   10  ? PT Treatment/Interventions Dry needling;Joint Manipulations;Spinal Manipulations;Patient/family education;Moist Heat;Neuromuscular re-education;Therapeutic exercise;Therapeutic activities;Cryotherapy;Taping;Manual techniques;ADLs/Self Care Home Management;Functional mobility training   ? Consulted and Agree with Plan of Care Patient   ? ?  ?  ? ?  ? ? ?Patient will benefit from skilled therapeutic intervention in order to improve the following deficits and impairments:  Increased muscle spasms, Impaired perceived functional ability, Decreased strength, Decreased mobility, Postural dysfunction ? ?Visit Diagnosis: ?Other abnormalities of gait and mobility ? ?Pelvic pain ? ?Other lack of coordination ? ?Plantar fasciitis of left foot ? ? ? ? ?Problem List ?Patient Active Problem List  ? Diagnosis Date Noted  ? Sprain of wrist 01/18/2022  ? Cervical arthritis 04/04/2021  ? 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 Anxiety 02/11/2020  ? Allergic rhinitis 02/11/2020  ? Respiratory illness 11/18/2019  ? Raynaud's phenomenon without gangrene 02/20/2019  ? Tingling in extremities 02/20/2019  ? Hypokalemia 02/20/2019  ? Gastroesophageal reflux disease 02/20/2019  ? Positive ANA (antinuclear antibody) 09/10/2018  ? Annual physical exam 08/21/2018  ? HLD (hyperlipidemia) 08/21/2018  ? Thyroid nodule 07/22/2018  ? Goiter 07/22/2018  ? Essential hypertension 02/18/2015  ? Generalized anxiety disorder 02/18/2015  ? Post-nasal drip 02/18/2015  ? Seasonal allergies 02/18/2015  ? 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 Obesity 03/27/2014  ? ? ?[name], PT ?02/03/2022, 10:47 AM ? ?[facility] ?[place], [place], [address] ?Phone: [phone]   Fax:  [phone] ? ?Name: Kelly Pittman ?MRN: [id number] ?Date of Birth: [birth year] ? ? ?

## 2022-02-07 ENCOUNTER — Encounter: Payer: Self-pay | Admitting: Obstetrics and Gynecology

## 2022-02-07 ENCOUNTER — Ambulatory Visit (INDEPENDENT_AMBULATORY_CARE_PROVIDER_SITE_OTHER): Payer: BC Managed Care – PPO | Admitting: Obstetrics and Gynecology

## 2022-02-07 ENCOUNTER — Other Ambulatory Visit: Payer: Self-pay

## 2022-02-07 ENCOUNTER — Encounter: Payer: BC Managed Care – PPO | Admitting: Physical Therapy

## 2022-02-07 DIAGNOSIS — D071 Carcinoma in situ of vulva: Secondary | ICD-10-CM

## 2022-02-07 NOTE — Progress Notes (Signed)
? ? ? ?  GYNECOLOGY OFFICE COLPOSCOPY PROCEDURE NOTE ? ?56 y.o. 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 ? ?Patient gave informed written consent, time out was performed.  Placed in lithotomy position. Vulva viewed with colposcope after application of acetic acid.  ? ?Colposcopy adequate? Yes ?No visible lesions and no abnormal vasculature; no biopsies obtained.   ?All specimens were labeled and sent to pathology. ? ?Patient was given post procedure instructions.  Will follow up in 6 months for repeat colposcopy and manage accordingly; patient will be contacted with results and recommendations.  Routine preventative health maintenance measures emphasized.  ? ? ?Rubie Maid, MD ?[facility] ? ? ? ? ? ?  ?

## 2022-02-16 ENCOUNTER — Encounter: Payer: BC Managed Care – PPO | Admitting: Physical Therapy

## 2022-02-20 ENCOUNTER — Encounter: Payer: Self-pay | Admitting: Podiatry

## 2022-02-20 ENCOUNTER — Ambulatory Visit (INDEPENDENT_AMBULATORY_CARE_PROVIDER_SITE_OTHER): Payer: BC Managed Care – PPO

## 2022-02-20 ENCOUNTER — Ambulatory Visit: Payer: BC Managed Care – PPO | Admitting: Podiatry

## 2022-02-20 DIAGNOSIS — M722 Plantar fascial fibromatosis: Secondary | ICD-10-CM

## 2022-02-20 DIAGNOSIS — G5791 Unspecified mononeuropathy of right lower limb: Secondary | ICD-10-CM

## 2022-02-20 DIAGNOSIS — R209 Unspecified disturbances of skin sensation: Secondary | ICD-10-CM | POA: Diagnosis not present

## 2022-02-20 NOTE — Progress Notes (Signed)
She presents today after seeing Aaron Edelman for follow-up of her Planter fasciitis and states that it seems to be doing better recently.  She still having some radiating pain along the lateral fifth area right foot.  She is also having spells where she says her foot is cold and the coldness extends of her tibial crest as she points to the area. ? ?Objective: Vital signs are stable alert and oriented x3.  She has reproducible sural nerve neuritis associated most likely with scar tissue from her previous peroneal tendon repair.  She gets a shooting sensation with palpation of the sural nerve.  Also her pulses are strong and palpable bilateral may be slightly diminished on the right side versus the left.  Otherwise I think she may be developing some early neuropathic changes in the right foot. ? ?Planter fasciitis resolving neuritis along the sural nerve distribution secondary to peroneal tendon surgery and cold right foot with palpable pulses. ? ?Plan: At this point I am going to request ABIs even though we can feel pulses I does want a make sure that she is not diminished.  She is also casted for orthotics today.  And I explained to her that most likely her nerve pain from her previous surgery will probably stay the same or we could try injecting it. ?

## 2022-02-20 NOTE — Progress Notes (Signed)
SITUATION ?Reason for Consult: Evaluation for Bilateral Custom Foot Orthoses ?Patient / Caregiver Report: Patient is ready for foot orthotics ? ?OBJECTIVE DATA: ?Patient History / Diagnosis:  ?  ICD-10-CM   ?1. Plantar fasciitis  M72.2   ?  ?2. Neuritis of right foot  G57.91   ?  ? ? ?Current or Previous Devices:   None and no history ? ?Foot Examination: ?Skin presentation:   Intact ?Ulcers & Callousing:   None ?Toe / Foot Deformities:  Pes cavus ?Weight Bearing Presentation:  Cavus ?Sensation:    Intact ? ?Shoe Size:    44M ? ?ORTHOTIC RECOMMENDATION ?Recommended Device: 1x pair of custom functional foot orthotics ? ?GOALS OF ORTHOSES ?- Reduce Pain ?- Prevent Foot Deformity ?- Prevent Progression of Further Foot Deformity ?- Relieve Pressure ?- Improve the Overall Biomechanical Function of the Foot and Lower Extremity. ? ?ACTIONS PERFORMED ?Potential out of pocket cost was communicated to patient. Patient understood and consent to casting. Patient was casted for Foot Orthoses via crush box. Procedure was explained and patient tolerated procedure well. Casts were shipped to central fabrication. All questions were answered and concerns addressed. ? ?PLAN ?Patient is to be called for fitting when devices are ready.  ? ? ?

## 2022-02-23 ENCOUNTER — Encounter: Payer: BC Managed Care – PPO | Admitting: Physical Therapy

## 2022-03-02 ENCOUNTER — Ambulatory Visit: Payer: BC Managed Care – PPO | Admitting: Physical Therapy

## 2022-03-06 ENCOUNTER — Encounter: Payer: Self-pay | Admitting: Family Medicine

## 2022-03-06 ENCOUNTER — Ambulatory Visit: Payer: BC Managed Care – PPO | Admitting: Family Medicine

## 2022-03-06 VITALS — BP 126/80 | HR 58 | Temp 98.1°F | Resp 14 | Ht 64.0 in | Wt 205.4 lb

## 2022-03-06 DIAGNOSIS — R0981 Nasal congestion: Secondary | ICD-10-CM | POA: Diagnosis not present

## 2022-03-06 LAB — POCT INFLUENZA A/B
Influenza A, POC: NEGATIVE
Influenza B, POC: NEGATIVE

## 2022-03-06 LAB — POC COVID19 BINAXNOW: SARS Coronavirus 2 Ag: NEGATIVE

## 2022-03-06 NOTE — Assessment & Plan Note (Signed)
Symptoms are most likely related to a viral illness. She will be tested for COVID and flu. Counseled that treatment is supportive for viral illnesses. She can continue OTC tylenol sinus if it is beneficial and nasonex. She can continue xyzal. She can add in plain mucinex if the tylenol sinus is not beneficial. She was advised to stay home until we contact her with her COVID test result. She will seek medical attention for fevers of 103F or higher, shortness of breath, or cough productive of blood. If she worsens over the next 2-3 days she will let us know to consider antibiotics at that time. Discussed that antibiotics are not indicated at this time given the short duration of her symptoms being more indicative of a viral illness.  ?

## 2022-03-06 NOTE — Patient Instructions (Addendum)
Nice to see you. ?You likely have a viral illness. ?Please stay home until we get your sendoff COVID test back and contact you with the result. ?You can continue the Nasonex and over-the-counter Tylenol Sinus though if neither of those are beneficial you can try plain Mucinex to see if that will help with your symptoms. You can also try over the counter Ibuprofen with food for any pain.  ?Please remain adequately hydrated. ?If your symptoms are worsening over the next couple of days please let me know and we can consider an antibiotic at that point. ?

## 2022-03-06 NOTE — Progress Notes (Signed)
?Tommi Rumps, MD ?Phone: [phone] ? ?Kelly Pittman is a 56 y.o. female who presents today for same day visit.  ? ?Respiratory illness: onset of symptoms 3 days ago ? Cough- yes, mild ? Congestion- yes ?  Sinus- yes ? Post nasal drip- yes ? Sore throat- yes ? Shortness of breath- no ? Fever-no, though has had chills ? Taste disturbance- yes possibly related to congestion ? Smell disturbance- yes, possibly related to congestion ? Covid exposure- no ? Covid vaccination- yes ? Covid booster- x1 ? Flu exposure- no ? Medications- nasonex (not beneficial), alkaseltzer plus (not beneficial), tylenol sinus (mild benefit) ? ? ?Social History  ? ?Tobacco Use  ?Smoking Status Former  ? Packs/day: 0.25  ? Types: Cigarettes  ? Quit date: 2020  ? Years since quitting: 3.3  ?Smokeless Tobacco Never  ? ? ?Current Outpatient Medications on File Prior to Visit  ?Medication Sig Dispense Refill  ? albuterol (VENTOLIN HFA) 108 (90 Base) MCG/ACT inhaler SMARTSIG:2 Inhalation Via Inhaler Every 6 Hours PRN    ? ALPRAZolam (XANAX) 0.25 MG tablet Take 1 tablet (0.25 mg total) by mouth at bedtime as needed. 30 tablet 5  ? Cholecalciferol ([employer]S D3) 125 MCG (5000 UT) capsule Take 1 capsule (5,000 Units total) by mouth daily. 90 capsule 3  ? cyanocobalamin 100 MCG tablet Take 100 mcg by mouth daily.    ? escitalopram (LEXAPRO) 10 MG tablet Take 1 tablet (10 mg total) by mouth daily. 90 tablet 3  ? hydrochlorothiazide (HYDRODIURIL) 12.5 MG tablet TAKE 1 TABLET (12.5 MG TOTAL) BY MOUTH DAILY. IN AM 90 tablet 3  ? latanoprost (XALATAN) 0.005 % ophthalmic solution 1 drop at bedtime.    ? levocetirizine (XYZAL) 5 MG tablet Take 1 tablet (5 mg total) by mouth every evening. 90 tablet 3  ? mometasone (NASONEX) 50 MCG/ACT nasal spray PLACE 2 SPRAYS INTO THE NOSE DAILY. 153 each 3  ? Multiple Vitamin (MULTIVITAMIN) capsule Take by mouth daily.     ? Nutritional Supplements (ESTROVEN PO) Take by mouth.     ? pantoprazole (PROTONIX) 40 MG  tablet Take 1 tablet (40 mg total) by mouth daily. 90 tablet 3  ? potassium chloride SA (KLOR-CON) 20 MEQ tablet Take 1 tablet (20 mEq total) by mouth daily. 90 tablet 3  ? telmisartan (MICARDIS) 20 MG tablet Take 1 tablet (20 mg total) by mouth daily. 90 tablet 3  ? meloxicam (MOBIC) 15 MG tablet Take 1 tablet (15 mg total) by mouth daily. (Patient not taking: Reported on 03/06/2022) 30 tablet 3  ? ?No current facility-administered medications on file prior to visit.  ? ? ? ?ROS see history of present illness ? ?Objective ? ?Physical Exam ?Vitals:  ? 03/06/22 0951  ?BP: 126/80  ?Pulse: (!) 58  ?Resp: 14  ?Temp: 98.1 ?F (36.7 ?C)  ?SpO2: 98%  ? ? ?BP Readings from Last 3 Encounters:  ?03/06/22 126/80  ?02/07/22 (!) 148/86  ?11/10/21 132/74  ? ?Wt Readings from Last 3 Encounters:  ?03/06/22 205 lb 6.4 oz (93.2 kg)  ?02/07/22 206 lb 9.6 oz (93.7 kg)  ?11/10/21 207 lb 3.2 oz (94 kg)  ? ? ?Physical Exam ?Constitutional:   ?   General: She is not in acute distress. ?   Appearance: She is not diaphoretic.  ?HENT:  ?   Head: Normocephalic and atraumatic.  ?   Left Ear: Tympanic membrane normal.  ?   Ears:  ?   Comments: Right TM mild erythema, intact light  reflex, no bulging, no fluid behind the TM ?   Mouth/Throat:  ?   Mouth: Mucous membranes are moist.  ?   Pharynx: Oropharynx is clear.  ?Eyes:  ?   Conjunctiva/sclera: Conjunctivae normal.  ?   Pupils: Pupils are equal, round, and reactive to light.  ?Cardiovascular:  ?   Rate and Rhythm: Normal rate and regular rhythm.  ?   Heart sounds: Normal heart sounds.  ?Pulmonary:  ?   Effort: Pulmonary effort is normal.  ?   Breath sounds: Normal breath sounds.  ?Lymphadenopathy:  ?   Cervical: No cervical adenopathy.  ?Skin: ?   General: Skin is warm and dry.  ?Neurological:  ?   Mental Status: She is alert.  ? ? ? ?Assessment/Plan: Please see individual problem list. ? ?Problem List Items Addressed This Visit   ? ? Sinus congestion - Primary  ?  Symptoms are most likely  related to a viral illness. She will be tested for COVID and flu. Counseled that treatment is supportive for viral illnesses. She can continue OTC tylenol sinus if it is beneficial and nasonex. She can continue xyzal. She can add in plain mucinex if the tylenol sinus is not beneficial. She was advised to stay home until we contact her with her COVID test result. She will seek medical attention for fevers of 103F or higher, shortness of breath, or cough productive of blood. If she worsens over the next 2-3 days she will let us know to consider antibiotics at that time. Discussed that antibiotics are not indicated at this time given the short duration of her symptoms being more indicative of a viral illness.  ? ?  ?  ? Relevant Orders  ? [place] COVID-19  ? Novel Coronavirus, NAA ([facility])  ? POCT Influenza A/B  ? ? ?Return if symptoms worsen or fail to improve. ? ?This visit occurred during the SARS-CoV-2 public health emergency.  Safety protocols were in place, including screening questions prior to the visit, additional usage of staff PPE, and extensive cleaning of exam room while observing appropriate contact time as indicated for disinfecting solutions.  ? ? ?Tommi Rumps, MD ?[place] ? ?

## 2022-03-07 ENCOUNTER — Other Ambulatory Visit: Payer: Self-pay | Admitting: Podiatry

## 2022-03-07 ENCOUNTER — Telehealth: Payer: Self-pay | Admitting: Internal Medicine

## 2022-03-07 DIAGNOSIS — R209 Unspecified disturbances of skin sensation: Secondary | ICD-10-CM

## 2022-03-07 LAB — NOVEL CORONAVIRUS, NAA: SARS-CoV-2, NAA: NOT DETECTED

## 2022-03-07 NOTE — Telephone Encounter (Signed)
Pt is calling about covid results ?[phone] ? ?

## 2022-03-09 ENCOUNTER — Encounter: Payer: BC Managed Care – PPO | Admitting: Physical Therapy

## 2022-03-13 ENCOUNTER — Other Ambulatory Visit: Payer: Self-pay | Admitting: Internal Medicine

## 2022-03-13 DIAGNOSIS — I10 Essential (primary) hypertension: Secondary | ICD-10-CM

## 2022-03-14 NOTE — Telephone Encounter (Signed)
Last OV with PCP- July 2022. ? ?Okay to refill HCTZ? ?

## 2022-03-15 ENCOUNTER — Telehealth: Payer: Self-pay

## 2022-03-15 NOTE — Telephone Encounter (Signed)
Left message on voicemail to call and set up appointment to pick up orthotics

## 2022-03-27 ENCOUNTER — Telehealth: Payer: Self-pay | Admitting: *Deleted

## 2022-03-27 NOTE — Telephone Encounter (Signed)
Patient calling for name of facility where her order for vascular study was sent,was given that information, [facility] in [place]. ?

## 2022-04-06 ENCOUNTER — Encounter: Payer: BC Managed Care – PPO | Admitting: Obstetrics and Gynecology

## 2022-04-07 ENCOUNTER — Encounter: Payer: BC Managed Care – PPO | Admitting: Obstetrics and Gynecology

## 2022-04-07 ENCOUNTER — Ambulatory Visit (INDEPENDENT_AMBULATORY_CARE_PROVIDER_SITE_OTHER): Payer: BC Managed Care – PPO

## 2022-04-07 DIAGNOSIS — R209 Unspecified disturbances of skin sensation: Secondary | ICD-10-CM

## 2022-04-10 ENCOUNTER — Other Ambulatory Visit: Payer: Self-pay | Admitting: Internal Medicine

## 2022-04-10 DIAGNOSIS — E876 Hypokalemia: Secondary | ICD-10-CM

## 2022-04-10 DIAGNOSIS — F419 Anxiety disorder, unspecified: Secondary | ICD-10-CM

## 2022-04-12 ENCOUNTER — Encounter: Payer: BC Managed Care – PPO | Admitting: Obstetrics and Gynecology

## 2022-04-17 ENCOUNTER — Ambulatory Visit: Payer: BC Managed Care – PPO

## 2022-04-17 DIAGNOSIS — M722 Plantar fascial fibromatosis: Secondary | ICD-10-CM

## 2022-04-17 NOTE — Progress Notes (Signed)
SITUATION Patient Name:  Kelly Pittman MRN:   [id number] Reason for Visit: Evaluation for foot orthotics  Patient Report: Chief Complaint: Patient would like to obtain pre-certification from their insurance regarding coverage and an out of pocket estimate before proceeding.  OBJECTIVE DATA Patient History / Diagnosis:     ICD-10-CM   1. Plantar fasciitis  M72.2       Physician Recommended Device(s): Foot orthotics  Laterality HCPCS Code  bilateral D5686   ACTIONS PERFORMED Patient was seen for evaluation for foot orthotics. Financial responsibility was discussed, and patient requested a check of benefits and an estimation of their out of pocket cost for the equipment.   PLAN patient is to be contacted once insurance pre-certification and out of pocket cost estimate is obtained so that they make an informed decision regarding plan of care. In the event patient elects not to pursue orthotic treatment, referring physician is to be contacted in order to update plan of care as needed. All questions were answered and concerns addressed.

## 2022-04-28 ENCOUNTER — Ambulatory Visit: Payer: BC Managed Care – PPO | Admitting: Internal Medicine

## 2022-04-28 ENCOUNTER — Encounter: Payer: Self-pay | Admitting: Internal Medicine

## 2022-04-28 VITALS — BP 120/76 | HR 64 | Temp 98.5°F | Resp 14 | Ht 64.0 in | Wt 205.2 lb

## 2022-04-28 DIAGNOSIS — E041 Nontoxic single thyroid nodule: Secondary | ICD-10-CM

## 2022-04-28 DIAGNOSIS — I10 Essential (primary) hypertension: Secondary | ICD-10-CM

## 2022-04-28 DIAGNOSIS — R739 Hyperglycemia, unspecified: Secondary | ICD-10-CM

## 2022-04-28 DIAGNOSIS — Z1389 Encounter for screening for other disorder: Secondary | ICD-10-CM

## 2022-04-28 DIAGNOSIS — E876 Hypokalemia: Secondary | ICD-10-CM

## 2022-04-28 DIAGNOSIS — J309 Allergic rhinitis, unspecified: Secondary | ICD-10-CM

## 2022-04-28 DIAGNOSIS — E66811 Obesity, class 1: Secondary | ICD-10-CM | POA: Insufficient documentation

## 2022-04-28 DIAGNOSIS — F419 Anxiety disorder, unspecified: Secondary | ICD-10-CM

## 2022-04-28 DIAGNOSIS — Z1231 Encounter for screening mammogram for malignant neoplasm of breast: Secondary | ICD-10-CM

## 2022-04-28 DIAGNOSIS — Z6835 Body mass index (BMI) 35.0-35.9, adult: Secondary | ICD-10-CM | POA: Insufficient documentation

## 2022-04-28 DIAGNOSIS — E669 Obesity, unspecified: Secondary | ICD-10-CM | POA: Insufficient documentation

## 2022-04-28 DIAGNOSIS — Z0000 Encounter for general adult medical examination without abnormal findings: Secondary | ICD-10-CM | POA: Diagnosis not present

## 2022-04-28 DIAGNOSIS — E2839 Other primary ovarian failure: Secondary | ICD-10-CM

## 2022-04-28 DIAGNOSIS — K219 Gastro-esophageal reflux disease without esophagitis: Secondary | ICD-10-CM

## 2022-04-28 HISTORY — DX: Obesity, unspecified: E66.9

## 2022-04-28 HISTORY — DX: Obesity, class 1: E66.811

## 2022-04-28 MED ORDER — ALPRAZOLAM 0.25 MG PO TABS: 0.2500 mg | ORAL_TABLET | Freq: Every evening | ORAL | 5 refills | Status: DC | PRN

## 2022-04-28 MED ORDER — TELMISARTAN 20 MG PO TABS: 20.0000 mg | ORAL_TABLET | Freq: Every day | ORAL | 3 refills | Status: DC

## 2022-04-28 MED ORDER — LEVOCETIRIZINE DIHYDROCHLORIDE 5 MG PO TABS: 5.0000 mg | ORAL_TABLET | Freq: Every evening | ORAL | 3 refills | Status: DC

## 2022-04-28 MED ORDER — SAXENDA 18 MG/3ML ~~LOC~~ SOPN: 0.6000 mg | PEN_INJECTOR | Freq: Every day | SUBCUTANEOUS | 3 refills | Status: DC

## 2022-04-28 MED ORDER — MOMETASONE FUROATE 50 MCG/ACT NA SUSP: NASAL | 3 refills | Status: DC

## 2022-04-28 MED ORDER — ESCITALOPRAM OXALATE 10 MG PO TABS: 10.0000 mg | ORAL_TABLET | Freq: Every day | ORAL | 3 refills | Status: DC

## 2022-04-28 MED ORDER — INSULIN PEN NEEDLE 30G X 8 MM MISC: 1.0000 | 3 refills | Status: DC | PRN

## 2022-04-28 MED ORDER — POTASSIUM CHLORIDE CRYS ER 20 MEQ PO TBCR: 20.0000 meq | EXTENDED_RELEASE_TABLET | Freq: Every day | ORAL | 3 refills | Status: DC

## 2022-04-28 MED ORDER — PANTOPRAZOLE SODIUM 40 MG PO TBEC: 40.0000 mg | DELAYED_RELEASE_TABLET | Freq: Every day | ORAL | 3 refills | Status: DC

## 2022-04-28 MED ORDER — HYDROCHLOROTHIAZIDE 12.5 MG PO TABS: 12.5000 mg | ORAL_TABLET | Freq: Every morning | ORAL | 3 refills | Status: DC

## 2022-04-28 NOTE — Progress Notes (Signed)
Chief Complaint  Patient presents with   Follow-up    6 mon, denies any concerns or pain. Had colonoscopy done few mon ago with [facility] and removed polyps.   Annual Exam   Annual  1. Htn controlled on hctz 12.5 mg qd and telmisartan 20 mg qd  2. Obesity wants to lose 20-30 lbs husband on ozempic and she asks about this will check A1c to see what this is and order saxenda for now as no h/o diabetes  3. Grief cousin 56 y.o female died today and she was going to see him tomorrow she grew up with him and he had bone cancer which presented as back pain  4. Refills all meds requested    Review of Systems  Constitutional:  Negative for weight loss.  HENT:  Negative for hearing loss.   Eyes:  Negative for blurred vision.  Respiratory:  Negative for shortness of breath.   Cardiovascular:  Negative for chest pain.  Gastrointestinal:  Negative for abdominal pain and blood in stool.  Genitourinary:  Negative for dysuria.  Musculoskeletal:  Negative for falls and joint pain.  Skin:  Negative for rash.  Neurological:  Negative for headaches.  Psychiatric/Behavioral:  Negative for depression.    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   Colon polyp 2014   Enlarged thyroid    Genital warts    inner labia minora   Hypertension    UTI (urinary tract infection)    Past Surgical History:  Procedure Laterality Date   ABDOMINAL HYSTERECTOMY     2009   BREAST BIOPSY Right 2003   CARPAL TUNNEL RELEASE     COLONOSCOPY WITH PROPOFOL N/A 05/18/2020   Procedure: COLONOSCOPY WITH PROPOFOL;  Surgeon: Jonathon Bellows, MD;  Location: [facility] ENDOSCOPY;  Service: Gastroenterology;  Laterality: N/A;   COLONOSCOPY WITH PROPOFOL N/A 05/19/2020   Procedure: COLONOSCOPY WITH PROPOFOL;  Surgeon: Jonathon Bellows, MD;  Location: [facility] ENDOSCOPY;  Service: Gastroenterology;  Laterality: N/A;   FOOT SURGERY     right    TONSILLECTOMY     1987   UNILATERAL SALPINGECTOMY     left   Family History  Problem Relation Age of Onset   Hypertension Mother    Hyperlipidemia Mother    Hypertension Father    Cancer Cousin        bone cancer   Breast cancer Neg Hx    Ovarian cancer Neg Hx    Stroke Neg Hx    Thyroid disease Neg Hx    Social History   Socioeconomic History   Marital status: Married    Spouse name: Not on file   Number of children: Not on file   Years of education: Not on file   Highest education level: Not on file  Occupational History   Not on file  Tobacco Use   Smoking status: Former    Packs/day: 0.25    Types: Cigarettes    Quit date: 2020    Years since quitting: 3.4   Smokeless tobacco: Never  Vaping Use   Vaping Use: Never used  Substance and Sexual Activity   Alcohol use: Yes    Comment: socially   Drug use: No   Sexual activity: Yes    Birth control/protection: Surgical  Other Topics Concern   Not on file  Social History Narrative   Married  Adopted 4 y.o daughter as 05/13/21   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>works at [employer]   No guns    Wears seat belt    Safe in relationship    Smokes 2 cig per day started in 39s    Social drinker    Social Determinants of Radio broadcast assistant Strain: Not on [employer] Insecurity: Not on file  Transportation Needs: Not on file  Physical Activity: Not on file  Stress: Not on file  Social Connections: Not on file  Intimate Partner Violence: Not on file   Current Meds  Medication Sig   albuterol (VENTOLIN HFA) 108 (90 Base) MCG/ACT inhaler SMARTSIG:2 Inhalation Via Inhaler Every 6 Hours PRN   Cholecalciferol (CVS D3) 125 MCG (5000 UT) capsule Take 1 capsule (5,000 Units total) by mouth daily.   cyanocobalamin 100 MCG tablet Take 100 mcg by mouth daily.   latanoprost (XALATAN) 0.005 % ophthalmic solution 1 drop at bedtime.   Liraglutide -Weight Management (SAXENDA) 18  MG/3ML SOPN Inject 0.6 mg into the skin daily. Qd x 1 week, 1.2 qd x 1 week, 1.8 qd x 1 week, 2.4 qd x 1 week, 3 qd x 1 week   Multiple Vitamin (MULTIVITAMIN) capsule Take by mouth daily.    Nutritional Supplements (ESTROVEN PO) Take by mouth.    [DISCONTINUED] ALPRAZolam (XANAX) 0.25 MG tablet Take 1 tablet (0.25 mg total) by mouth at bedtime as needed.   [DISCONTINUED] escitalopram (LEXAPRO) 10 MG tablet TAKE 1 TABLET BY MOUTH EVERY DAY   [DISCONTINUED] hydrochlorothiazide (HYDRODIURIL) 12.5 MG tablet TAKE 1 TABLET DAILY IN THE MORNING.   [DISCONTINUED] KLOR-CON M20 20 MEQ tablet TAKE 1 TABLET BY MOUTH EVERY DAY   [DISCONTINUED] levocetirizine (XYZAL) 5 MG tablet Take 1 tablet (5 mg total) by mouth every evening.   [DISCONTINUED] mometasone (NASONEX) 50 MCG/ACT nasal spray PLACE 2 SPRAYS INTO THE NOSE DAILY.   [DISCONTINUED] pantoprazole (PROTONIX) 40 MG tablet Take 1 tablet (40 mg total) by mouth daily.   [DISCONTINUED] telmisartan (MICARDIS) 20 MG tablet Take 1 tablet (20 mg total) by mouth daily.   Allergies  Allergen Reactions   Losartan Potassium-Hctz     Other reaction(s): Other (See Comments) Weight gain, constipation   Tape Rash   Recent Results (from the past 2160 hour(s))  Novel Coronavirus, NAA ([facility])     Status: None   Collection Time: 03/06/22 10:10 AM   Specimen: Nasopharyngeal(NP) swabs in vial transport medium   Nasopharynge  Previous  Result Value Ref Range   SARS-CoV-2, NAA Not Detected Not Detected    Comment: This nucleic acid amplification test was developed and its performance characteristics determined by [employer]. Nucleic acid amplification tests include RT-PCR and TMA. This test has not been FDA cleared or approved. This test has been authorized by FDA under an Emergency Use Authorization (EUA). This test is only authorized for the duration of time the declaration that circumstances exist justifying the authorization of the emergency use of in  vitro diagnostic tests for detection of SARS-CoV-2 virus and/or diagnosis of COVID-19 infection under section 564(b)(1) of the Act, 21 U.S.C. 387FIE-3(P) (1), unless the authorization is terminated or revoked sooner. When diagnostic testing is negative, the possibility of a false negative result should be considered in the context of a patient's recent exposures and the presence of clinical signs and symptoms consistent with COVID-19. An individual without symptoms of COVID-19 and who is not shedding SARS-CoV-2  virus wo uld expect to have a negative (not detected) result in this assay.   POCT Influenza A/B     Status: None   Collection Time: 03/06/22 10:47 AM  Result Value Ref Range   Influenza A, POC Negative Negative   Influenza B, POC Negative Negative  POC COVID-19     Status: None   Collection Time: 03/06/22 10:48 AM  Result Value Ref Range   SARS Coronavirus 2 Ag Negative Negative   Objective  Body mass index is 35.22 kg/m. Wt Readings from Last 3 Encounters:  04/28/22 205 lb 3.2 oz (93.1 kg)  03/06/22 205 lb 6.4 oz (93.2 kg)  02/07/22 206 lb 9.6 oz (93.7 kg)   Temp Readings from Last 3 Encounters:  04/28/22 98.5 F (36.9 C) (Oral)  03/06/22 98.1 F (36.7 C) (Oral)  11/10/21 98.2 F (36.8 C)   BP Readings from Last 3 Encounters:  04/28/22 120/76  03/06/22 126/80  02/07/22 (!) 148/86   Pulse Readings from Last 3 Encounters:  04/28/22 64  03/06/22 (!) 58  02/07/22 (!) 59    Physical Exam Vitals and nursing note reviewed.  Constitutional:      Appearance: Normal appearance. She is well-developed and well-groomed.  HENT:     Head: Normocephalic and atraumatic.  Eyes:     Conjunctiva/sclera: Conjunctivae normal.     Pupils: Pupils are equal, round, and reactive to light.  Cardiovascular:     Rate and Rhythm: Normal rate and regular rhythm.     Heart sounds: Normal heart sounds. No murmur heard. Pulmonary:     Effort: Pulmonary effort is normal.      Breath sounds: Normal breath sounds.  Abdominal:     General: Abdomen is flat. Bowel sounds are normal.     Tenderness: There is no abdominal tenderness.  Musculoskeletal:        General: No tenderness.  Skin:    General: Skin is warm and dry.  Neurological:     General: No focal deficit present.     Mental Status: She is alert and oriented to person, place, and time. Mental status is at baseline.     Cranial Nerves: Cranial nerves 2-12 are intact.     Motor: Motor function is intact.     Coordination: Coordination is intact.     Gait: Gait is intact.  Psychiatric:        Attention and Perception: Attention and perception normal.        Mood and Affect: Mood and affect normal.        Speech: Speech normal.        Behavior: Behavior normal. Behavior is cooperative.        Thought Content: Thought content normal.        Cognition and Memory: Cognition and memory normal.        Judgment: Judgment normal.     Assessment  Plan  Annual physical exam See below   Essential hypertension controlled- Plan: Lipid panel, CBC with Differential/Platelet, Comprehensive metabolic panel, Liraglutide -Weight Management (SAXENDA) 18 MG/3ML SOPN,  hctz 12.5 mg qd and telmisartan 20 mg qd   Thyroid nodule - Plan: TSH  Hypokalemia - Plan: potassium chloride SA (KLOR-CON M20) 20 MEQ tablet  Hyperglycemia - Plan: Hemoglobin A1c  BMI 35.0-35.9,adult - Plan: Liraglutide -Weight Management (SAXENDA) 18 MG/3ML SOPN  Anxiety - Plan: ALPRAZolam (XANAX) 0.25 MG tablet, escitalopram (LEXAPRO) 10 MG tablet  Gastroesophageal reflux disease - Plan: pantoprazole (PROTONIX) 40 MG tablet  Allergic  rhinitis, unspecified - Plan: mometasone (NASONEX) 50 MCG/ACT nasal spray  Allergic rhinitis, unspecified seasonality, unspecified trigger - Plan: levocetirizine (XYZAL) 5 MG tablet   HM Flu shot utd Tdap had 03/30/17 Consider pna 20 shingrix 2/2 utd covid 3/3 pfizer consider booster    Mammogram neg  06/09/2020 negative ordered sch 07/10/21 ordered 07/10/22    Pap appears not done saw Dr. Marcelline Mates 04/09/18 per pt had pap per but Dr. Dessie Coma notes no pap s/p hysterectomy no h/o abnormal pap and no need further pap    Colonoscopy h/o polyps need to get record [facility] last colonoscopy in [place] signed release prev echo and colonoscopy -05/19/20 poor prep 1 polyp repeat in 6 months serrated polyp [facility]  01/02/22 pending [facility] colonoscopy    DEXA consider in future with h/o hysterectomy    Thyroid US no further w/u 09/2019    -of note also need echo report -2nd request  Smoker 2 cig/qd stopped 7 or 06/2019 started in 20s light smoker    rec healthy diet and exercise  Provider: Dr. Olivia Mackie McLean-Scocuzza-[facility]

## 2022-04-28 NOTE — Patient Instructions (Addendum)
Dr. Volanda Napoleon will be here 05/2022   Call and schedule mammogram and bone density    Liraglutide Injection (Weight Management)-saxenda  What is this medication? LIRAGLUTIDE (LIR a GLOO tide) promotes weight loss. It may also be used to maintain weight loss. It works by decreasing appetite. Changes to diet and exercise are often combined with this medication. This medicine may be used for other purposes; ask your health care provider or pharmacist if you have questions. COMMON BRAND NAME(S): Saxenda What should I tell my care team before I take this medication? They need to know if you have any of these conditions: Endocrine tumors (MEN 2) or if someone in your family had these tumors Gallbladder disease High cholesterol History of alcohol abuse problem History of pancreatitis Kidney disease or if you are on dialysis Liver disease Previous swelling of the tongue, face, or lips with difficulty breathing, difficulty swallowing, hoarseness, or tightening of the throat Stomach problems Suicidal thoughts, plans, or attempt; a previous suicide attempt by you or a family member Thyroid cancer or if someone in your family had thyroid cancer An unusual or allergic reaction to liraglutide, other medications, foods, dyes, or preservatives Pregnant or trying to get pregnant Breast-feeding How should I use this medication? This medication is for injection under the skin of your upper leg, stomach area, or upper arm. You will be taught how to prepare and give this medication. Use exactly as directed. Take your medication at regular intervals. Do not take it more often than directed. This medication comes with INSTRUCTIONS FOR USE. Ask your pharmacist for directions on how to use this medication. Read the information carefully. Talk to your pharmacist or care team if you have questions. It is important that you put your used needles and syringes in a special sharps container. Do not put them in a trash can.  If you do not have a sharps container, call your pharmacist or care team to get one. A special MedGuide will be given to you by the pharmacist with each prescription and refill. Be sure to read this information carefully each time. Talk to your care team about the use of this medication in children. While it may be prescribed for children as young as 22 years of age for selected conditions, precautions do apply. Overdosage: If you think you have taken too much of this medicine contact a poison control center or emergency room at once. NOTE: This medicine is only for you. Do not share this medicine with others. What if I miss a dose? If you miss a dose, take it as soon as you can. If it is almost time for your next dose, take only that dose. Do not take double or extra doses. If you miss your dose for 3 days or more, call your care team to talk about how to restart this medicine. What may interact with this medication? Insulin and other medications for diabetes This list may not describe all possible interactions. Give your health care provider a list of all the medicines, herbs, non-prescription drugs, or dietary supplements you use. Also tell them if you smoke, drink alcohol, or use illegal drugs. Some items may interact with your medicine. What should I watch for while using this medication? Visit your care team for regular checks on your progress. Drink plenty of fluids while taking this medication. Check with your care team if you get an attack of severe diarrhea, nausea, and vomiting. The loss of too much body fluid can make it  dangerous for you to take this medication. This medication may affect blood sugar levels. Ask your care team if changes in diet or medications are needed if you have diabetes. Patients and their families should watch out for worsening depression or thoughts of suicide. Also watch out for sudden changes in feelings such as feeling anxious, agitated, panicky, irritable,  hostile, aggressive, impulsive, severely restless, overly excited and hyperactive, or not being able to sleep. If this happens, especially at the beginning of treatment or after a change in dose, call your care team. Women should inform their care team if they wish to become pregnant or think they might be pregnant. Losing weight while pregnant is not advised and may cause harm to the unborn child. Talk to your care team for more information. What side effects may I notice from receiving this medication? Side effects that you should report to your care team as soon as possible: Allergic reactions or angioedema--skin rash, itching, hives, swelling of the face, eyes, lips, tongue, arms, or legs, trouble swallowing or breathing Fast or irregular heartbeat Gallbladder problems--severe stomach pain, nausea, vomiting, fever Kidney injury--decrease in the amount of urine, swelling of the ankles, hands, or feet Pancreatitis--severe stomach pain that spreads to your back or gets worse after eating or when touched, fever, nausea, vomiting Thoughts of suicide or self-harm, worsening mood, feelings of depression Thyroid cancer--new mass or lump in the neck, pain or trouble swallowing, trouble breathing, hoarseness Side effects that usually do not require medical attention (report to your care team if they continue or are bothersome): Constipation Dizziness Fatigue Headache Loss of Appetite Nausea Upset stomach This list may not describe all possible side effects. Call your doctor for medical advice about side effects. You may report side effects to FDA at 1-800-FDA-1088. Where should I keep my medication? Keep out of the reach of children and pets. Store unopened pen in a refrigerator between 2 and 8 degrees C (36 and 46 degrees F). Do not freeze or use if the medication has been frozen. Protect from light and excessive heat. After you first use the pen, it can be stored at room temperature between 15 and  30 degrees C (59 and 86 degrees F) or in a refrigerator. Throw away your used pen after 30 days or after the expiration date, whichever comes first. Do not store your pen with the needle attached. If the needle is left on, medication may leak from the pen. NOTE: This sheet is a summary. It may not cover all possible information. If you have questions about this medicine, talk to your doctor, pharmacist, or health care provider.  2023 Elsevier/Gold Standard (2020-12-03 00:00:00)

## 2022-04-29 LAB — CBC WITH DIFFERENTIAL/PLATELET
Absolute Monocytes: 319 cells/uL (ref 200–950)
Basophils Absolute: 53 cells/uL (ref 0–200)
Basophils Relative: 0.7 %
Eosinophils Absolute: 160 cells/uL (ref 15–500)
Eosinophils Relative: 2.1 %
HCT: 37.7 % (ref 35.0–45.0)
Hemoglobin: 12.4 g/dL (ref 11.7–15.5)
Lymphs Abs: 3420 cells/uL (ref 850–3900)
MCH: 29.2 pg (ref 27.0–33.0)
MCHC: 32.9 g/dL (ref 32.0–36.0)
MCV: 88.9 fL (ref 80.0–100.0)
MPV: 10.1 fL (ref 7.5–12.5)
Monocytes Relative: 4.2 %
Neutro Abs: 3648 cells/uL (ref 1500–7800)
Neutrophils Relative %: 48 %
Platelets: 336 10*3/uL (ref 140–400)
RBC: 4.24 10*6/uL (ref 3.80–5.10)
RDW: 13.4 % (ref 11.0–15.0)
Total Lymphocyte: 45 %
WBC: 7.6 10*3/uL (ref 3.8–10.8)

## 2022-04-29 LAB — COMPREHENSIVE METABOLIC PANEL
AG Ratio: 1.8 (calc) (ref 1.0–2.5)
ALT: 16 U/L (ref 6–29)
AST: 17 U/L (ref 10–35)
Albumin: 4.5 g/dL (ref 3.6–5.1)
Alkaline phosphatase (APISO): 98 U/L (ref 37–153)
BUN: 11 mg/dL (ref 7–25)
CO2: 27 mmol/L (ref 20–32)
Calcium: 9.9 mg/dL (ref 8.6–10.4)
Chloride: 102 mmol/L (ref 98–110)
Creat: 0.78 mg/dL (ref 0.50–1.03)
Globulin: 2.5 g/dL (calc) (ref 1.9–3.7)
Glucose, Bld: 96 mg/dL (ref 65–99)
Potassium: 3.5 mmol/L (ref 3.5–5.3)
Sodium: 141 mmol/L (ref 135–146)
Total Bilirubin: 0.4 mg/dL (ref 0.2–1.2)
Total Protein: 7 g/dL (ref 6.1–8.1)

## 2022-04-29 LAB — HEMOGLOBIN A1C
Hgb A1c MFr Bld: 5.6 % of total Hgb (ref <5.7)
Mean Plasma Glucose: 114 mg/dL
eAG (mmol/L): 6.3 mmol/L

## 2022-04-29 LAB — LIPID PANEL
Cholesterol: 194 mg/dL (ref <200)
HDL: 53 mg/dL (ref >=50)
LDL Cholesterol (Calc): 115 mg/dL (calc) — ABNORMAL HIGH
Non-HDL Cholesterol (Calc): 141 mg/dL (calc) — ABNORMAL HIGH (ref <130)
Total CHOL/HDL Ratio: 3.7 (calc) (ref <5.0)
Triglycerides: 145 mg/dL (ref <150)

## 2022-04-29 LAB — URINALYSIS, ROUTINE W REFLEX MICROSCOPIC
Bilirubin Urine: NEGATIVE
Glucose, UA: NEGATIVE
Hgb urine dipstick: NEGATIVE
Ketones, ur: NEGATIVE
Leukocytes,Ua: NEGATIVE
Nitrite: NEGATIVE
Protein, ur: NEGATIVE
Specific Gravity, Urine: 1.025 (ref 1.001–1.035)
pH: 5.5 (ref 5.0–8.0)

## 2022-04-29 LAB — TSH: TSH: 0.55 mIU/L (ref 0.40–4.50)

## 2022-04-30 ENCOUNTER — Other Ambulatory Visit: Payer: Self-pay | Admitting: Internal Medicine

## 2022-04-30 DIAGNOSIS — E876 Hypokalemia: Secondary | ICD-10-CM

## 2022-05-17 ENCOUNTER — Other Ambulatory Visit: Payer: Self-pay | Admitting: Internal Medicine

## 2022-05-17 DIAGNOSIS — J309 Allergic rhinitis, unspecified: Secondary | ICD-10-CM

## 2022-06-08 ENCOUNTER — Other Ambulatory Visit: Payer: Self-pay | Admitting: Internal Medicine

## 2022-06-08 DIAGNOSIS — J309 Allergic rhinitis, unspecified: Secondary | ICD-10-CM

## 2022-06-30 ENCOUNTER — Other Ambulatory Visit: Payer: Self-pay

## 2022-06-30 ENCOUNTER — Other Ambulatory Visit: Payer: Self-pay | Admitting: Internal Medicine

## 2022-06-30 DIAGNOSIS — J309 Allergic rhinitis, unspecified: Secondary | ICD-10-CM

## 2022-06-30 MED ORDER — LEVOCETIRIZINE DIHYDROCHLORIDE 5 MG PO TABS: 5.0000 mg | ORAL_TABLET | Freq: Every evening | ORAL | 3 refills | Status: DC

## 2022-07-06 ENCOUNTER — Telehealth: Payer: Self-pay | Admitting: Internal Medicine

## 2022-07-06 NOTE — Telephone Encounter (Signed)
Patient was put on Liraglutide -Weight Management (SAXENDA) 18 MG/3ML SOPN and she only has lost 6 or 7 pounds. Patient 's husband stop taking his Ozempic and she wanted to know if she could take his Ozempic.

## 2022-07-06 NOTE — Telephone Encounter (Signed)
What dose of saxenda is she on?  She is not diabetic so only qualifies for wegovy or saxenda?

## 2022-07-07 ENCOUNTER — Telehealth: Payer: Self-pay

## 2022-07-07 NOTE — Telephone Encounter (Signed)
Max dose of ozempic is 2 mg weekly if on 3 mg saxenda ozempic 2 mg weekly ok

## 2022-07-07 NOTE — Telephone Encounter (Signed)
Pt has been notified of Dr. Audrie Gallus advice on taking ozempic:   McLean-Scocuzza, Nino Glow, MD  You 49 minutes ago (9:10 AM)   TM Max dose of ozempic is 2 mg weekly if on 3 mg saxenda ozempic 2 mg weekly ok         Note     You  McLean-Scocuzza, Nino Glow, MD 1 hour ago (8:41 AM)    Pt stated she is on the 3 mg of saxenda. I told her she was not diabetic and that she would only qualify for saxenda or wegovy. Pt stated she knows she is not diabetic but she wanted to know can she just use her husbands prescription; because he lost weight and is not taking it any more but he has a lot of it still? She also wanted to know how often she would take it? What mg would she take ?     McLean-Scocuzza, Nino Glow, MD  You 17 hours ago (4:53 PM)   TM What dose of saxenda is she on?  She is not diabetic so only qualifies for wegovy or saxenda?        Note    You  McLean-Scocuzza, Nino Glow, MD 17 hours ago (4:29 PM)

## 2022-07-07 NOTE — Telephone Encounter (Signed)
Spoke with pt in regards to her first call and to ask some questions that Dr. Olivia Mackie had for her.    McLean-Scocuzza, Nino Glow, MD  You 15 hours ago (4:53 PM)   TM What dose of saxenda is she on?  She is not diabetic so only qualifies for wegovy or saxenda?        Note    You  McLean-Scocuzza, Nino Glow, MD 16 hours ago (4:29 PM)    Pt is on Saxenda but has only lost 6-7 lbs Patient 's husband stop taking his Ozempic and she wanted to know if she could take his Ozempic. Pls advise    Sheaffer-Poudrier, Lydia Guiles routed conversation to You 19 hours ago (1:24 PM)   Sheaffer-Poudrier, Lydia Guiles 19 hours ago (1:22 PM)   KS Patient was put on Liraglutide -Weight Management (SAXENDA) 18 MG/3ML SOPN and she only has lost 6 or 7 pounds. Patient 's husband stop taking his Ozempic and she wanted to know if she could take his Ozempic.      Note

## 2022-07-07 NOTE — Telephone Encounter (Signed)
Pt notified- verbalized understanding.

## 2022-07-26 ENCOUNTER — Telehealth: Payer: Self-pay

## 2022-07-26 ENCOUNTER — Other Ambulatory Visit: Payer: Self-pay | Admitting: Internal Medicine

## 2022-07-26 DIAGNOSIS — I10 Essential (primary) hypertension: Secondary | ICD-10-CM

## 2022-07-26 DIAGNOSIS — Z6835 Body mass index (BMI) 35.0-35.9, adult: Secondary | ICD-10-CM

## 2022-07-26 MED ORDER — SAXENDA 18 MG/3ML ~~LOC~~ SOPN: 2.4000 mg | PEN_INJECTOR | Freq: Every day | SUBCUTANEOUS | 11 refills | Status: DC

## 2022-07-26 NOTE — Telephone Encounter (Signed)
Sent to [facility] but she can transfer her own Rx 1 pharmacy to another  If she was taking ozempic 1 mg can go up saxenda 1.8 daily x 1 week then increase to 2.4 daily x 1 week then increase to 3.0 daily long term  If was taking ozempic 2 mg ok to increase to 2.4 daily saxenda x 1 week then 3.0 daily x 1 week

## 2022-07-26 NOTE — Telephone Encounter (Signed)
Patient states Dr. Olivia Mackie McLean-Scocuzza put her on Liraglutide -Weight Management (SAXENDA) 18 MG/3ML SOPN for weight loss.  Patient states her husband had some Ozempic left and she tried two doses of this, but it is making her crave sweets.  Patient states she would like to switch back to the Liraglutide -Weight Management (SAXENDA) 18 MG/3ML SOPN.  Patient states she would like to know if she can go right back to taking the 3 MG dose, or if she has to start back with the lower dose and work her way back up to the 3 MG.  *Patient states she would like this prescription to be called in to the [facility] on [address], because they usually carry this medication.

## 2022-07-26 NOTE — Telephone Encounter (Signed)
She can start with 0.6 x 1 week daily, then 1.2 daily x 1week, then 1.8 daily x 1 week then 2.4 daily x 1 week  Slowly go up again

## 2022-07-26 NOTE — Telephone Encounter (Signed)
Patient states she would like a call back please.

## 2022-08-03 NOTE — Progress Notes (Signed)
     GYNECOLOGY OFFICE COLPOSCOPY PROCEDURE NOTE  56 y.o. 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  Patient gave informed written consent, time out was performed.  Placed in lithotomy position. Vulva viewed without, and then with and colposcope after application of acetic acid.   Colposcopy adequate? Yes  no visible lesions, no acetowhite areas, and no abnormal vasculature; no corresponding biopsies obtained.    Chaperone was present during entire procedure.  Patient was given post procedure instructions.  Will follow up pathology and manage accordingly; patient will be contacted with results and recommendations.  Routine preventative health maintenance measures emphasized.    Rubie Maid, MD [facility]

## 2022-08-04 ENCOUNTER — Encounter: Payer: Self-pay | Admitting: Obstetrics and Gynecology

## 2022-08-04 ENCOUNTER — Ambulatory Visit (INDEPENDENT_AMBULATORY_CARE_PROVIDER_SITE_OTHER): Payer: BC Managed Care – PPO | Admitting: Obstetrics and Gynecology

## 2022-08-04 VITALS — BP 125/74 | HR 58 | Resp 16 | Ht 64.0 in | Wt 198.5 lb

## 2022-08-04 DIAGNOSIS — D071 Carcinoma in situ of vulva: Secondary | ICD-10-CM | POA: Diagnosis not present

## 2022-08-04 DIAGNOSIS — A630 Anogenital (venereal) warts: Secondary | ICD-10-CM

## 2022-08-10 ENCOUNTER — Encounter: Payer: BC Managed Care – PPO | Admitting: Obstetrics and Gynecology

## 2022-08-14 ENCOUNTER — Encounter: Payer: Self-pay | Admitting: Obstetrics and Gynecology

## 2022-08-17 ENCOUNTER — Telehealth: Payer: Self-pay | Admitting: Podiatry

## 2022-08-17 NOTE — Telephone Encounter (Signed)
Pt states that she has left several messages regarding her orthotics. She was last seen in office in 04/2022 and says that she ordered her inserts but never heard back if insurance would cover them  or what her cost would be. She has not received a call letting her know that the orthotics came in.  Please advise

## 2022-08-21 ENCOUNTER — Ambulatory Visit
Admission: RE | Admit: 2022-08-21 | Discharge: 2022-08-21 | Disposition: A | Discharge status: Home / Self Care | Payer: BC Managed Care – PPO | Source: Ambulatory Visit | Attending: Internal Medicine | Admitting: Internal Medicine

## 2022-08-21 DIAGNOSIS — E2839 Other primary ovarian failure: Secondary | ICD-10-CM | POA: Diagnosis present

## 2022-08-21 DIAGNOSIS — Z1231 Encounter for screening mammogram for malignant neoplasm of breast: Secondary | ICD-10-CM | POA: Insufficient documentation

## 2022-08-31 ENCOUNTER — Ambulatory Visit (INDEPENDENT_AMBULATORY_CARE_PROVIDER_SITE_OTHER): Payer: BC Managed Care – PPO | Admitting: Obstetrics and Gynecology

## 2022-08-31 ENCOUNTER — Encounter: Payer: Self-pay | Admitting: Obstetrics and Gynecology

## 2022-08-31 VITALS — BP 110/72 | HR 67 | Resp 16 | Ht 64.0 in | Wt 199.1 lb

## 2022-08-31 DIAGNOSIS — Z01419 Encounter for gynecological examination (general) (routine) without abnormal findings: Secondary | ICD-10-CM

## 2022-08-31 DIAGNOSIS — N952 Postmenopausal atrophic vaginitis: Secondary | ICD-10-CM

## 2022-08-31 DIAGNOSIS — I10 Essential (primary) hypertension: Secondary | ICD-10-CM

## 2022-08-31 DIAGNOSIS — Z6835 Body mass index (BMI) 35.0-35.9, adult: Secondary | ICD-10-CM

## 2022-08-31 DIAGNOSIS — D071 Carcinoma in situ of vulva: Secondary | ICD-10-CM | POA: Diagnosis not present

## 2022-08-31 NOTE — Patient Instructions (Incomplete)

## 2022-08-31 NOTE — Progress Notes (Signed)
ANNUAL PREVENTATIVE CARE GYNECOLOGY  ENCOUNTER NOTE  Subjective:       Kelly Pittman is a 56 y.o. [id number] female here for a routine annual gynecologic exam. The patient is taking herbal remedies for hormone replacement therapy (OTC Estroven). Patient denies post-menopausal vaginal bleeding. The patient wears seatbelts: yes. The patient participates in regular exercise: occasional. Has the patient ever been transfused or tattooed?: no.    Current complaints: 1.  She has no new concerns today.    Gynecologic History No LMP recorded. Patient has had a hysterectomy. Contraception: status post hysterectomy Last Pap: 12/2015. Results were: normal Last Pap: 12/2015. Results were: normal Last mammogram:08/21/2022. Results were: normal.  Patient with h/o abnormal mammogram 20 years ago, had right breast biopsy, benign findings.  Last Colonoscopy: 05/19/2020.  Recommended repeat after 6 months (due to incomplete prep).  Previously had findings of colon polyps on last colonoscopy 5 years ago.  Last Dexa Scan: 08/21/2022.  Results Osteopenia, T score -1.9.     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   Colon polyp 2014   Enlarged thyroid    Genital warts    inner labia minora   Hypertension    UTI (urinary tract infection)     Family History  Problem Relation Age of Onset   Hypertension Mother    Hyperlipidemia Mother    Hypertension Father    Cancer Cousin        bone cancer   Breast cancer Neg Hx    Ovarian cancer Neg Hx    Stroke Neg Hx    Thyroid disease Neg Hx      Past Surgical History:  Procedure Laterality Date   ABDOMINAL HYSTERECTOMY     2009   BREAST BIOPSY Right 2003   CARPAL TUNNEL RELEASE     COLONOSCOPY WITH PROPOFOL N/A 05/18/2020   Procedure: COLONOSCOPY WITH PROPOFOL;  Surgeon: Jonathon Bellows, MD;  Location: [facility] ENDOSCOPY;  Service: Gastroenterology;  Laterality: N/A;   COLONOSCOPY WITH PROPOFOL N/A 05/19/2020   Procedure: COLONOSCOPY WITH PROPOFOL;  Surgeon: Jonathon Bellows, MD;  Location[facility]COPY;  Service: Gastroenterology;  Laterality: N/A;   FOOT SURGERY     right   TONSILLECTOMY     1987   UNILATERAL SALPINGECTOMY     left    Social History   Socioeconomic History   Marital status: Married    Spouse name: Not on file   Number of children: Not on file   Years of education: Not on file   Highest education level: Not on file  Occupational History   Not on file  Tobacco Use   Smoking status: Former    Packs/day: 0.25    Types: Cigarettes    Quit date: 2020    Years since quitting: 3.8   Smokeless  tobacco: Never  Vaping Use   Vaping Use: Never used  Substance and Sexual Activity   Alcohol use: Yes    Comment: socially   Drug use: No   Sexual activity: Yes    Birth control/protection: Surgical  Other Topics Concern   Not on file  Social History Narrative   Married    Adopted 67 y.o daughter as 05/13/21   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>works at [employer]   No guns    Wears seat belt    Safe in relationship    Smokes 2 cig per day started in 30s    Social drinker    Social Determinants of Radio broadcast assistant Strain: Not on file  Food Insecurity: Not on file  Transportation Needs: Not on file  Physical Activity: Not on file  Stress: Not on file  Social Connections: Not on file  Intimate Partner Violence: Not on file    Current Outpatient Medications on File Prior to Visit  Medication Sig Dispense Refill   albuterol (VENTOLIN HFA) 108 (90 Base) MCG/ACT  inhaler SMARTSIG:2 Inhalation Via Inhaler Every 6 Hours PRN     ALPRAZolam (XANAX) 0.25 MG tablet Take 1 tablet (0.25 mg total) by mouth at bedtime as needed. 30 tablet 5   Cholecalciferol ([employer] D3) 125 MCG (5000 UT) capsule Take 1 capsule (5,000 Units total) by mouth daily. 90 capsule 3   cyanocobalamin 100 MCG tablet Take 100 mcg by mouth daily.     escitalopram (LEXAPRO) 10 MG tablet Take 1 tablet (10 mg total) by mouth daily. 90 tablet 3   hydrochlorothiazide (HYDRODIURIL) 12.5 MG tablet Take 1 tablet (12.5 mg total) by mouth every morning. 90 tablet 3   Insulin Pen Needle (NOVOFINE) 30G X 8 MM MISC Inject 10 each into the skin as needed. 100 each 3   latanoprost (XALATAN) 0.005 % ophthalmic solution 1 drop at bedtime.     levocetirizine (XYZAL) 5 MG tablet TAKE 1 TABLET BY MOUTH EVERY DAY IN THE EVENING 90 tablet 3   levocetirizine (XYZAL) 5 MG tablet Take 1 tablet (5 mg total) by mouth every evening. 90 tablet 3   Liraglutide -Weight Management (SAXENDA) 18 MG/3ML SOPN Inject 2.4-3 mg into the skin daily. From ozempic take 1.8 qd x 1 week, 2.4 qd x 1 week, 3 qd x 1 week 9 mL 11   mometasone (NASONEX) 50 MCG/ACT nasal spray PLACE 2 SPRAYS INTO THE NOSE DAILY. 51 each 3   Multiple Vitamin (MULTIVITAMIN) capsule Take by mouth daily.      Nutritional Supplements (ESTROVEN PO) Take by mouth.      pantoprazole (PROTONIX) 40 MG tablet Take 1 tablet (40 mg total) by mouth daily. 90 tablet 3   potassium chloride SA (KLOR-CON M) 20 MEQ tablet TAKE 1 TABLET DAILY. 90 tablet 3   telmisartan (MICARDIS) 20 MG tablet Take 1 tablet (20 mg total) by mouth daily. 90 tablet 3   No current facility-administered medications on file prior to visit.    Allergies  Allergen Reactions   Losartan Potassium-Hctz     Other reaction(s): Other (See Comments) Weight gain, constipation   Tape Rash      Review of Systems ROS Review of Systems - General ROS: negative for - chills, fatigue, fever, hot flashes,  night sweats, weight gain or weight loss Psychological ROS: negative for - anxiety, decreased libido, depression, mood swings, physical abuse or sexual abuse Ophthalmic ROS: negative  for - blurry vision, eye pain or loss of vision ENT ROS: negative for - headaches, hearing change, visual changes or vocal changes Allergy and Immunology ROS: negative for - hives, itchy/watery eyes or seasonal allergies Hematological and Lymphatic ROS: negative for - bleeding problems, bruising, swollen lymph nodes or weight loss Endocrine ROS: negative for - galactorrhea, hair pattern changes, hot flashes, malaise/lethargy, mood swings, palpitations, polydipsia/polyuria, skin changes, temperature intolerance or unexpected weight changes Breast ROS: negative for - new or changing breast lumps or nipple discharge Respiratory ROS: negative for - cough or shortness of breath Cardiovascular ROS: negative for - chest pain, irregular heartbeat, palpitations or shortness of breath Gastrointestinal ROS: no abdominal pain, change in bowel habits, or black or bloody stools Genito-Urinary ROS: no dysuria, trouble voiding, or hematuria Musculoskeletal ROS: negative for - joint pain or joint stiffness Neurological ROS: negative for - bowel and bladder control changes Dermatological ROS: negative for rash and skin lesion changes   Objective:   BP 110/72   Pulse 67   Resp 16   Ht '5\' 4"'$  (1.626 m)   Wt 199 lb 1.6 oz (90.3 kg)   BMI 34.18 kg/m Body mass index is 34.18 kg/m.  CONSTITUTIONAL: Well-developed, well-nourished female in no acute distress. Mild obesity PSYCHIATRIC: Normal mood and affect. Normal behavior. Normal judgment and thought content. [place]: Alert and oriented to person, place, and time. Normal muscle tone coordination. No cranial nerve deficit noted. HENT:  Normocephalic, atraumatic, External right and left ear normal. Oropharynx is clear and moist EYES: Conjunctivae and EOM are normal. Pupils are  equal, round, and reactive to light. No scleral icterus.  NECK: Normal range of motion, supple, no masses.  Normal thyroid.  SKIN: Skin is warm and dry. No rash noted. Not diaphoretic. No erythema. No pallor. CARDIOVASCULAR: Normal heart rate noted, regular rhythm, no murmur. RESPIRATORY: Clear to auscultation bilaterally. Effort and breath sounds normal, no problems with respiration noted. BREASTS: Symmetric in size. No masses, skin changes, nipple drainage, or lymphadenopathy. ABDOMEN: Soft, normal bowel sounds, no distention noted.  No tenderness, rebound or guarding.  BLADDER: Normal PELVIC:  Bladder no bladder distension noted  Urethra: normal appearing urethra with no masses, tenderness or lesions  Vulva: normal appearing vulva with no masses, tenderness or lesions  Vagina: mild vaginal atrophy, no discharge and no lesions  Cervix: surgically absent  Uterus: surgically absent, vaginal cuff well healed  Adnexa: normal adnexa in size, nontender and no masses  RV: External Exam NormaI, No Rectal Masses, and Normal Sphincter tone  MUSCULOSKELETAL: Normal range of motion. No tenderness.  No cyanosis, clubbing, or edema.  2+ distal pulses. LYMPHATIC: No Axillary, Supraclavicular, or Inguinal Adenopathy.   Labs: Lab Results  Component Value Date   WBC 7.6 04/28/2022   HGB 12.4 04/28/2022   HCT 37.7 04/28/2022   MCV 88.9 04/28/2022   PLT 336 04/28/2022    Lab Results  Component Value Date   CREATININE 0.78 04/28/2022   BUN 11 04/28/2022   NA 141 04/28/2022   K 3.5 04/28/2022   CL 102 04/28/2022   CO2 27 04/28/2022    Lab Results  Component Value Date   ALT 16 04/28/2022   AST 17 04/28/2022   ALKPHOS 95 09/23/2020   BILITOT 0.4 04/28/2022    Lab Results  Component Value Date   CHOL 194 04/28/2022   HDL 53 04/28/2022   LDLCALC 115 (H) 04/28/2022   TRIG 145 04/28/2022   CHOLHDL 3.7 04/28/2022    Lab  Results  Component Value Date   TSH 0.55 04/28/2022    Lab  Results  Component Value Date   HGBA1C 5.6 04/28/2022     Assessment:   1. 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 Essential hypertension   4. Vaginal atrophy   5. BMI 35.0-35.9,adult      Plan:  Pap: Not needed. S/p hysterectomy.  Mammogram:  UTD .  Colon Screening:   UTD.  Labs:  UTD Routine preventative health maintenance measures emphasized: Exercise/Diet/Weight control, Tobacco Warnings, Alcohol/Substance use risks, Stress Management, Peer Pressure Issues, and Safe Sex COVID Vaccination status: Completed 3 doses of Pfizer Is eligible for next vaccine in series.  Flu vaccine: up to date.  Vaginal atrophy not bothersome to patient.  Return to [place], MD [place]

## 2022-09-01 ENCOUNTER — Encounter: Payer: BC Managed Care – PPO | Admitting: Obstetrics and Gynecology

## 2022-09-22 NOTE — Telephone Encounter (Signed)
Spoke with pt, she will call back momentarily

## 2022-10-16 ENCOUNTER — Ambulatory Visit (INDEPENDENT_AMBULATORY_CARE_PROVIDER_SITE_OTHER): Payer: Self-pay | Admitting: Podiatry

## 2022-10-16 ENCOUNTER — Encounter: Payer: Self-pay | Admitting: Podiatry

## 2022-10-16 DIAGNOSIS — M722 Plantar fascial fibromatosis: Secondary | ICD-10-CM

## 2022-10-16 NOTE — Progress Notes (Signed)
Patient presents today to pick up custom molded foot orthotics, diagnosed with plantar fasciitis by Dr. Milinda Pointer.   Orthotics were dispensed and fit was satisfactory. Reviewed instructions for break-in and wear. Written instructions given to patient.  Patient will follow up as needed.   [facility] - order # [id number]

## 2022-11-07 ENCOUNTER — Other Ambulatory Visit: Payer: Self-pay | Admitting: Family Medicine

## 2022-11-07 ENCOUNTER — Encounter: Payer: Self-pay | Admitting: Family Medicine

## 2022-11-07 ENCOUNTER — Ambulatory Visit: Payer: BC Managed Care – PPO | Admitting: Family Medicine

## 2022-11-07 VITALS — BP 136/76 | HR 58 | Temp 98.6°F | Ht 64.0 in | Wt 195.8 lb

## 2022-11-07 DIAGNOSIS — E785 Hyperlipidemia, unspecified: Secondary | ICD-10-CM | POA: Diagnosis not present

## 2022-11-07 DIAGNOSIS — I10 Essential (primary) hypertension: Secondary | ICD-10-CM

## 2022-11-07 DIAGNOSIS — Z13220 Encounter for screening for lipoid disorders: Secondary | ICD-10-CM

## 2022-11-07 DIAGNOSIS — Z6835 Body mass index (BMI) 35.0-35.9, adult: Secondary | ICD-10-CM

## 2022-11-07 DIAGNOSIS — K219 Gastro-esophageal reflux disease without esophagitis: Secondary | ICD-10-CM | POA: Diagnosis not present

## 2022-11-07 DIAGNOSIS — E66812 Obesity, class 2: Secondary | ICD-10-CM

## 2022-11-07 DIAGNOSIS — F419 Anxiety disorder, unspecified: Secondary | ICD-10-CM

## 2022-11-07 LAB — MICROALBUMIN / CREATININE URINE RATIO
Creatinine,U: 57.1 mg/dL
Microalb Creat Ratio: 1.2 mg/g (ref 0.0–30.0)
Microalb, Ur: <0.7 mg/dL (ref 0.0–1.9)

## 2022-11-07 MED ORDER — TELMISARTAN 20 MG PO TABS: 20.0000 mg | ORAL_TABLET | Freq: Every day | ORAL | 3 refills | Status: DC

## 2022-11-07 MED ORDER — HYDROCHLOROTHIAZIDE 12.5 MG PO TABS: 12.5000 mg | ORAL_TABLET | Freq: Every morning | ORAL | 3 refills | Status: DC

## 2022-11-07 MED ORDER — SAXENDA 18 MG/3ML ~~LOC~~ SOPN: 0.6000 mg | PEN_INJECTOR | Freq: Every day | SUBCUTANEOUS | 11 refills | Status: DC

## 2022-11-07 MED ORDER — PANTOPRAZOLE SODIUM 40 MG PO TBEC: 40.0000 mg | DELAYED_RELEASE_TABLET | Freq: Every day | ORAL | 3 refills | Status: DC

## 2022-11-07 NOTE — Patient Instructions (Addendum)
It was a pleasure meeting you today. Thank you for allowing me to take part in your health care.  Our goals for today as we discussed include:  Wean Lexapro Start Lexapro 10 mg every other day for 2 weeks Week 3 start Lexapro 10 mg every third day for 2 weeks Week 5 start Lexapro 10 mg every fourth day for 2 weeks Week 7 start Lexapro 5 mg every fourth day until completed.   For your blood pressure Continue hydrochlorothiazide 12.5 mg in the morning Recommend taking telmisartan 20 mg at night Continue potassium chloride 20 mEq daily  Follow-up in 3 months  Follow-up in 6 months for annual physical.  Please schedule lab appointment 1 week prior to office visit for fasting lab work.  If you have any questions or concerns, please do not hesitate to call the office at [phone].  I look forward to our next visit and until then take care and stay safe.  Regards,   Carollee Leitz, MD   [facility]

## 2022-11-07 NOTE — Progress Notes (Signed)
SUBJECTIVE:   Chief Complaint  Patient presents with   Establish Care    Transfer of Care   HPI Patient presents to clinic to transfer care  No acute concerns today.  Hypertension Asymptomatic.  Currently taking HydroDIURIL 12.5 mg daily and telmisartan 20 mg daily.  Tolerating medications well.  Denies any headaches, visual changes, chest pain, shortness of breath, abdominal pain or lower extremity edema.  Hyperlipidemia Not currently on statin.  Recommended diet control.  Mood disorder History of anxiety.  Has been taking Lexapro 10 mg daily.  Would like to wean medication in future.  Also prescribed Xanax 0.25 mg at bedtime.  Patient reports has last taken 1 month ago to help with sleep.  Discussed discontinuation of medication given increase side effect including confusion, cognitive decline and increasing falls with advancing age.  PERTINENT PMH / PSH: Hypertension Arthritis Hyperlipidemia Mood disorder   OBJECTIVE:  BP 136/76   Pulse (!) 58   Temp 98.6 F (37 C) (Oral)   Ht '5\' 4"'$  (1.626 m)   Wt 195 lb 12.8 oz (88.8 kg)   SpO2 99%   BMI 33.61 kg/m    Physical Exam Vitals reviewed.  Constitutional:      General: She is not in acute distress.    Appearance: She is not ill-appearing.  HENT:     Head: Normocephalic.     Nose: Nose normal.  Eyes:     Conjunctiva/sclera: Conjunctivae normal.  Cardiovascular:     Rate and Rhythm: Regular rhythm. Bradycardia present.     Pulses: Normal pulses.     Heart sounds: Normal heart sounds.  Pulmonary:     Effort: Pulmonary effort is normal.     Breath sounds: Normal breath sounds.  Abdominal:     General: Abdomen is flat. Bowel sounds are normal.     Palpations: Abdomen is soft.  Musculoskeletal:        General: Normal range of motion.     Cervical back: Normal range of motion.  Skin:    General: Skin is warm.  Neurological:     Mental Status: She is alert and oriented to person, place, and time. Mental  status is at baseline.  Psychiatric:        Mood and Affect: Mood normal.        Behavior: Behavior normal.        Thought Content: Thought content normal.        Judgment: Judgment normal.     ASSESSMENT/PLAN:  Essential hypertension Assessment & Plan: Chronic.  Stable.  At goal less than 140/90 Continue telmisartan 20 mg daily Continue hydrochlorothiazide 12.5 mg daily  Orders: -     hydroCHLOROthiazide; Take 1 tablet (12.5 mg total) by mouth every morning.  Dispense: 90 tablet; Refill: 3 -     Telmisartan; Take 1 tablet (20 mg total) by mouth daily.  Dispense: 90 tablet; Refill: 3 -     Comprehensive metabolic panel; Future -     Microalbumin / creatinine urine ratio  Class 2 severe obesity due to excess calories with serious comorbidity and body mass index (BMI) of 35.0 to 35.9 in adult [facility]) Assessment & Plan: Chronic.  Stable.  Previously started on Saxenda had been up to 1.2 mg weekly and has been out of medication for few months. Start Saxenda 0.6 mg weekly injectable x 4 weeks, titrate to 1.2 mg at week 5. Medication currently on backorder. Recommend monitoring nutrition, and increase exercise to maintain current weight and  help with further weight loss. Follow-up PCP in 2 to 3 months   Gastroesophageal reflux disease -     Pantoprazole Sodium; Take 1 tablet (40 mg total) by mouth daily.  Dispense: 90 tablet; Refill: 3  Hyperlipidemia, unspecified hyperlipidemia type Assessment & Plan: Chronic. Not on statin.  Diet controlled.  Orders: -     Lipid panel; Future   PDMP reviewed  Return in about 3 months (around 02/06/2023) for PCP. Please schedule annual physical in 6 months with fasting lab appointment 1 week prior to visit  Carollee Leitz, MD

## 2022-11-08 NOTE — Telephone Encounter (Signed)
resent

## 2022-11-08 NOTE — Telephone Encounter (Signed)
[facility] called and needs clarification on Liraglutide -Weight Management (SAXENDA) 18 MG/3ML SOPN,

## 2022-11-16 ENCOUNTER — Telehealth: Payer: Self-pay | Admitting: Family Medicine

## 2022-11-16 NOTE — Telephone Encounter (Signed)
Lupe [facility] called stating the saxenda is on back order

## 2022-11-19 ENCOUNTER — Encounter: Payer: Self-pay | Admitting: Family Medicine

## 2022-11-19 DIAGNOSIS — Z13220 Encounter for screening for lipoid disorders: Secondary | ICD-10-CM | POA: Insufficient documentation

## 2022-11-19 NOTE — Assessment & Plan Note (Signed)
Chronic.  Stable.  At goal less than 140/90 Continue telmisartan 20 mg daily Continue hydrochlorothiazide 12.5 mg daily

## 2022-11-19 NOTE — Assessment & Plan Note (Signed)
Chronic. Not on statin.  Diet controlled.

## 2022-11-19 NOTE — Assessment & Plan Note (Addendum)
Chronic.  Stable.  Previously started on Saxenda had been up to 1.2 mg weekly and has been out of medication for few months. Start Saxenda 0.6 mg weekly injectable x 4 weeks, titrate to 1.2 mg at week 5. Medication currently on backorder. Recommend monitoring nutrition, and increase exercise to maintain current weight and help with further weight loss. Follow-up PCP in 2 to 3 months

## 2023-01-02 ENCOUNTER — Telehealth: Payer: Self-pay | Admitting: Family Medicine

## 2023-01-02 NOTE — Telephone Encounter (Signed)
Pt advised Will continue to check around local pharmacies and let me know if she finds one that has in stock.

## 2023-01-02 NOTE — Telephone Encounter (Signed)
Patient called and said that Liraglutide -Weight Management (SAXENDA) 18 MG/3ML SOPN is on back order. She wanted to know what else could she take to replace Saxenda. Patient states she is out of medication.

## 2023-03-02 ENCOUNTER — Inpatient Hospital Stay: Admission: RE | Admit: 2023-03-02 | Payer: BC Managed Care – PPO | Source: Ambulatory Visit

## 2023-05-03 ENCOUNTER — Other Ambulatory Visit: Payer: Self-pay | Admitting: *Deleted

## 2023-05-03 DIAGNOSIS — J309 Allergic rhinitis, unspecified: Secondary | ICD-10-CM

## 2023-05-03 MED ORDER — LEVOCETIRIZINE DIHYDROCHLORIDE 5 MG PO TABS: 5.0000 mg | ORAL_TABLET | Freq: Every evening | ORAL | 3 refills | Status: AC

## 2023-05-03 NOTE — Telephone Encounter (Signed)
Last filled by Dr. TMS 

## 2023-05-07 ENCOUNTER — Other Ambulatory Visit: Payer: BC Managed Care – PPO

## 2023-05-11 ENCOUNTER — Ambulatory Visit: Admit: 2023-05-11 | Payer: BC Managed Care – PPO | Admitting: Gastroenterology

## 2023-05-11 SURGERY — COLONOSCOPY WITH PROPOFOL
Anesthesia: General

## 2023-05-14 ENCOUNTER — Ambulatory Visit (INDEPENDENT_AMBULATORY_CARE_PROVIDER_SITE_OTHER): Payer: BC Managed Care – PPO | Admitting: Family Medicine

## 2023-05-14 ENCOUNTER — Telehealth: Payer: Self-pay | Admitting: *Deleted

## 2023-05-14 VITALS — BP 124/84 | HR 62 | Temp 98.1°F | Ht 64.0 in | Wt 188.0 lb

## 2023-05-14 DIAGNOSIS — Z0000 Encounter for general adult medical examination without abnormal findings: Secondary | ICD-10-CM | POA: Diagnosis not present

## 2023-05-14 DIAGNOSIS — E6609 Other obesity due to excess calories: Secondary | ICD-10-CM

## 2023-05-14 DIAGNOSIS — Z6832 Body mass index (BMI) 32.0-32.9, adult: Secondary | ICD-10-CM | POA: Diagnosis not present

## 2023-05-14 DIAGNOSIS — R739 Hyperglycemia, unspecified: Secondary | ICD-10-CM

## 2023-05-14 DIAGNOSIS — E785 Hyperlipidemia, unspecified: Secondary | ICD-10-CM | POA: Diagnosis not present

## 2023-05-14 DIAGNOSIS — F39 Unspecified mood [affective] disorder: Secondary | ICD-10-CM

## 2023-05-14 DIAGNOSIS — I10 Essential (primary) hypertension: Secondary | ICD-10-CM

## 2023-05-14 DIAGNOSIS — Z114 Encounter for screening for human immunodeficiency virus [HIV]: Secondary | ICD-10-CM

## 2023-05-14 DIAGNOSIS — R7989 Other specified abnormal findings of blood chemistry: Secondary | ICD-10-CM

## 2023-05-14 LAB — COMPREHENSIVE METABOLIC PANEL
ALT: 12 U/L (ref 0–35)
AST: 21 U/L (ref 0–37)
Albumin: 4.2 g/dL (ref 3.5–5.2)
Alkaline Phosphatase: 85 U/L (ref 39–117)
BUN: 15 mg/dL (ref 6–23)
CO2: 30 mEq/L (ref 19–32)
Calcium: 10 mg/dL (ref 8.4–10.5)
Chloride: 104 mEq/L (ref 96–112)
Creatinine, Ser: 0.81 mg/dL (ref 0.40–1.20)
GFR: 80.63 mL/min (ref >60.00)
Glucose, Bld: 80 mg/dL (ref 70–99)
Potassium: 3.3 mEq/L — ABNORMAL LOW (ref 3.5–5.1)
Sodium: 142 mEq/L (ref 135–145)
Total Bilirubin: 0.6 mg/dL (ref 0.2–1.2)
Total Protein: 7.2 g/dL (ref 6.0–8.3)

## 2023-05-14 LAB — CBC WITH DIFFERENTIAL/PLATELET
Basophils Absolute: 0 10*3/uL (ref 0.0–0.1)
Basophils Relative: 0.7 % (ref 0.0–3.0)
Eosinophils Absolute: 0.2 10*3/uL (ref 0.0–0.7)
Eosinophils Relative: 4 % (ref 0.0–5.0)
HCT: 36.7 % (ref 36.0–46.0)
Hemoglobin: 12.1 g/dL (ref 12.0–15.0)
Lymphocytes Relative: 54 % — ABNORMAL HIGH (ref 12.0–46.0)
Lymphs Abs: 3.3 10*3/uL (ref 0.7–4.0)
MCHC: 33 g/dL (ref 30.0–36.0)
MCV: 92.5 fl (ref 78.0–100.0)
Monocytes Absolute: 0.4 10*3/uL (ref 0.1–1.0)
Monocytes Relative: 6.1 % (ref 3.0–12.0)
Neutro Abs: 2.2 10*3/uL (ref 1.4–7.7)
Neutrophils Relative %: 35.2 % — ABNORMAL LOW (ref 43.0–77.0)
Platelets: 310 10*3/uL (ref 150.0–400.0)
RBC: 3.97 Mil/uL (ref 3.87–5.11)
RDW: 13.7 % (ref 11.5–15.5)
WBC: 6.2 10*3/uL (ref 4.0–10.5)

## 2023-05-14 LAB — HEMOGLOBIN A1C: Hgb A1c MFr Bld: 5.4 % (ref 4.6–6.5)

## 2023-05-14 LAB — LIPID PANEL
Cholesterol: 172 mg/dL (ref 0–200)
HDL: 48.3 mg/dL (ref >39.00)
LDL Cholesterol: 109 mg/dL — ABNORMAL HIGH (ref 0–99)
NonHDL: 123.36
Total CHOL/HDL Ratio: 4
Triglycerides: 71 mg/dL (ref 0.0–149.0)
VLDL: 14.2 mg/dL (ref 0.0–40.0)

## 2023-05-14 LAB — TSH: TSH: 0.65 u[IU]/mL (ref 0.35–5.50)

## 2023-05-14 LAB — VITAMIN B12: Vitamin B-12: >1500 pg/mL — ABNORMAL HIGH (ref 211–911)

## 2023-05-14 LAB — VITAMIN D 25 HYDROXY (VIT D DEFICIENCY, FRACTURES): VITD: >120 ng/mL

## 2023-05-14 MED ORDER — ESCITALOPRAM OXALATE 10 MG PO TABS: 5.0000 mg | ORAL_TABLET | Freq: Every day | ORAL | 0 refills | Status: DC

## 2023-05-14 MED ORDER — SEMAGLUTIDE-WEIGHT MANAGEMENT 0.5 MG/0.5ML ~~LOC~~ SOAJ
0.5000 mg | SUBCUTANEOUS | 0 refills | Status: DC
Start: 2023-06-12 — End: 2023-06-22

## 2023-05-14 MED ORDER — SEMAGLUTIDE-WEIGHT MANAGEMENT 0.25 MG/0.5ML ~~LOC~~ SOAJ
0.2500 mg | SUBCUTANEOUS | 0 refills | Status: DC
Start: 2023-05-14 — End: 2023-06-22

## 2023-05-14 NOTE — Patient Instructions (Addendum)
It was a pleasure meeting you today. Thank you for allowing me to take part in your health care.  Our goals for today as we discussed include:  We will get some labs today.  If they are abnormal or we need to do something about them, I will call you.  If they are normal, I will send you a message on MyChart (if it is active) or a letter in the mail.  If you don't hear from [place] in 2 weeks, please call the office at the number below.   Start Wegovy 0.25mg  weekly for 4 weeks, increase to 0.5 mg weekly on week 5 Follow up with PCP on week 7 after starting medication   Stop Lexapro 10 mg Start Lexapro 5 mg daily  Will follow up on colonoscopy and when due   If you have any questions or concerns, please do not hesitate to call the office at [phone].  I look forward to our next visit and until then take care and stay safe.  Regards,   Dana Allan, MD   [facility]

## 2023-05-14 NOTE — Progress Notes (Signed)
SUBJECTIVE:   Chief Complaint  Patient presents with   Annual Exam    No other concerns.    HPI Patient presents to clinic for annual physical  No acute concerns today.  Hypertension Asymptomatic.  Does not check BP at home  Doing well on current medication. Compliant with HydroDIURIL 12.5 mg daily and telmisartan 20 mg daily. Denies any headaches, visual changes, chest pain, shortness of breath, abdominal pain or lower extremity edema.  Hyperlipidemia Not currently on statin The 10-year ASCVD risk score (Arnett DK, et al., 2019) is: 9.6%   Mood disorder History of anxiety.  Has decreased Lexapro from 10 mg to 5 mg daily and doing well.  Denies any mood changes, SI/HI.  Has not needed medication to help with sleep and not interested in restarting Xanax at this time.  Weight loss management Was previously prescribed Saxenda but was unable to continue due to shortage. Report husband had been prescribed Ozempic but was not taking so she started this as it was weekly.  She would like to start [facility] for continued weight loss management as husband now needs Ozempic for elevated A1c.  PERTINENT PMH / PSH: Hypertension Arthritis Hyperlipidemia Mood disorder   OBJECTIVE:  BP 124/84 (BP Location: Left Arm, Patient Position: Sitting, Cuff Size: Normal)   Pulse 62   Temp 98.1 F (36.7 C) (Oral)   Ht 5\' 4"  (1.626 m)   Wt 188 lb (85.3 kg)   SpO2 97%   BMI 32.27 kg/m    Physical Exam Vitals reviewed.  Constitutional:      General: She is not in acute distress.    Appearance: Normal appearance. She is obese. She is not ill-appearing, toxic-appearing or diaphoretic.  Eyes:     General:        Right eye: No discharge.        Left eye: No discharge.     Conjunctiva/sclera: Conjunctivae normal.  Cardiovascular:     Rate and Rhythm: Normal rate and regular rhythm.     Heart sounds: Normal heart sounds.  Pulmonary:     Effort: Pulmonary effort is normal.     Breath sounds:  Normal breath sounds.  Abdominal:     General: Bowel sounds are normal.  Musculoskeletal:        General: Normal range of motion.  Skin:    General: Skin is warm and dry.  Neurological:     General: No focal deficit present.     Mental Status: She is alert and oriented to person, place, and time. Mental status is at baseline.  Psychiatric:        Mood and Affect: Mood normal.        Behavior: Behavior normal.        Thought Content: Thought content normal.        Judgment: Judgment normal.       05/14/2023   10:00 AM 11/07/2022    1:17 PM 04/28/2022    2:30 PM 09/23/2020    8:16 AM 02/11/2020    9:25 AM  Depression screen PHQ 2/9  Decreased Interest 0 0 0 0 0  Down, Depressed, Hopeless 0 0 0 0 0  PHQ - 2 Score 0 0 0 0 0       ASSESSMENT/PLAN:  Annual physical exam Assessment & Plan: HIV screening labs today Hepatitis C screening completed Mammogram up to date.  Due 10/24.  Referral sent. Recommend regular self breast exams Colonoscopy due yearly.  Patient follows with Dr  [place] at [place]. Have requested records x 2.  Last colonoscopy was documented as scheduled for 01/02/2022, unable to view results. Vaccinations up to date No indication for PAP secondary to TAH   Essential hypertension Assessment & Plan: Chronic. Well controlled on current medication Continue telmisartan 20 mg daily Continue hydrochlorothiazide 12.5 mg daily Check Bmet  Orders: -     Comprehensive metabolic panel  Hyperlipidemia, unspecified hyperlipidemia type Assessment & Plan: Chronic. Not on statin.  Recent LDL 115, goal <100 Check fasting lipids Declines statin therapy Encourage healthy diet and increase activity   Orders: -     Lipid panel  Hyperglycemia Assessment & Plan: Check A1c  Orders: -     Hemoglobin A1c  Class 1 obesity due to excess calories with serious comorbidity and body mass index (BMI) of 32.0 to 32.9 in adult Assessment & Plan: Chronic.  Stable.  Was taking  Saxenda, then switched to Ozempic and was tolerating 0.5 mg dose. Requesting switch to [facility] 0.25 mg weekly x 4 weeks, if tolerating, increase to 0.5 mg weekly Recommend monitoring nutrition, and increase exercise to maintain current weight and help with further weight loss. Follow up on week 7 after initiation on medication.  Patient aware to schedule appointment. Check TSH, CBC, A1c, Vitamin D, Vitamin B 12  Orders: -     CBC with Differential/Platelet -     TSH -     VITAMIN D 25 Hydroxy (Vit-D Deficiency, Fractures) -     Vitamin B12 -     Semaglutide-Weight Management; Inject 0.25 mg into the skin once a week for 28 days.  Dispense: 2 mL; Refill: 0 -     Semaglutide-Weight Management; Inject 0.5 mg into the skin once a week for 28 days.  Dispense: 2 mL; Refill: 0  Encounter for screening for HIV -     HIV Antibody (routine testing w rflx)  Mood disorder (HCC) Assessment & Plan: Chronic. Denies SI/HI.  Doing well on lower dose of SSRI Taking Lexapro.  Has weaned from 10 mg to 5 mg daily and tolerating well Refill Lexapro 10 mg take half tablet daily Encouraged CBT Follow up as needed  Orders: -     Escitalopram Oxalate; Take 0.5 tablets (5 mg total) by mouth daily.  Dispense: 30 tablet; Refill: 0  High serum vitamin D -     VITAMIN D 25 Hydroxy (Vit-D Deficiency, Fractures); Future      PDMP reviewed  Return if symptoms worsen or fail to improve, for PCP. Please schedule f/u 7 weeks after starting [facility]  Dana Allan, MD

## 2023-05-14 NOTE — Telephone Encounter (Signed)
CRITICAL VALUE STICKER  CRITICAL VALUE: Vit D >120  RECEIVER (on-site recipient of call): Silvestre Moment, CMA  DATE & TIME NOTIFIED: 05/14/23 @ 2:50pm  MESSENGER (representative from lab): [facility] MD NOTIFIED: Dr. Clent Ridges  TIME OF NOTIFICATION: 2:51pm  RESPONSE:  see note

## 2023-05-15 LAB — HIV ANTIBODY (ROUTINE TESTING W REFLEX): HIV 1&2 Ab, 4th Generation: NONREACTIVE

## 2023-05-15 NOTE — Telephone Encounter (Signed)
Called and informed pt of this information. She agreed.

## 2023-05-18 ENCOUNTER — Other Ambulatory Visit (HOSPITAL_COMMUNITY): Payer: Self-pay

## 2023-05-18 ENCOUNTER — Telehealth: Payer: Self-pay

## 2023-05-18 NOTE — Telephone Encounter (Signed)
Patient Advocate Encounter   Received notification from [employer] that prior authorization is required for [facility] 0.25MG /0.5ML auto-injectors   Submitted: 05-18-2023 Key [id number]  Status is pending

## 2023-05-23 NOTE — Telephone Encounter (Signed)
Patient Advocate Encounter  Prior Authorization for Reginal Lutes has been approved with [facility].    PA# [id number] Effective dates: 05/18/23 through 12/19/23  Approval letter attached to charts

## 2023-05-24 NOTE — Telephone Encounter (Signed)
Left a detailed message concerning [facility] approval.  Also sent my chart message.

## 2023-05-25 ENCOUNTER — Other Ambulatory Visit: Payer: Self-pay | Admitting: Family Medicine

## 2023-05-25 DIAGNOSIS — D72820 Lymphocytosis (symptomatic): Secondary | ICD-10-CM

## 2023-06-09 ENCOUNTER — Encounter: Payer: Self-pay | Admitting: Family Medicine

## 2023-06-09 DIAGNOSIS — F39 Unspecified mood [affective] disorder: Secondary | ICD-10-CM | POA: Insufficient documentation

## 2023-06-09 DIAGNOSIS — R7989 Other specified abnormal findings of blood chemistry: Secondary | ICD-10-CM | POA: Insufficient documentation

## 2023-06-09 DIAGNOSIS — R739 Hyperglycemia, unspecified: Secondary | ICD-10-CM | POA: Insufficient documentation

## 2023-06-09 DIAGNOSIS — Z114 Encounter for screening for human immunodeficiency virus [HIV]: Secondary | ICD-10-CM | POA: Insufficient documentation

## 2023-06-09 NOTE — Assessment & Plan Note (Signed)
Chronic. Denies SI/HI.  Doing well on lower dose of SSRI Taking Lexapro.  Has weaned from 10 mg to 5 mg daily and tolerating well Refill Lexapro 10 mg take half tablet daily Encouraged CBT Follow up as needed

## 2023-06-09 NOTE — Assessment & Plan Note (Signed)
Check A1c. 

## 2023-06-09 NOTE — Assessment & Plan Note (Addendum)
Chronic.  Stable.  Was taking Saxenda, then switched to Ozempic and was tolerating 0.5 mg dose. Requesting switch to [facility] 0.25 mg weekly x 4 weeks, if tolerating, increase to 0.5 mg weekly Recommend monitoring nutrition, and increase exercise to maintain current weight and help with further weight loss. Follow up on week 7 after initiation on medication.  Patient aware to schedule appointment. Check TSH, CBC, A1c, Vitamin D, Vitamin B 12

## 2023-06-09 NOTE — Assessment & Plan Note (Signed)
HIV screening labs today Hepatitis C screening completed Mammogram up to date.  Due 10/24.  Referral sent. Recommend regular self breast exams Colonoscopy due yearly.  Patient follows with Dr Norma Fredrickson at [place]. Have requested records x 2.  Last colonoscopy was documented as scheduled for 01/02/2022, unable to view results. Vaccinations up to date No indication for PAP secondary to TAH

## 2023-06-09 NOTE — Assessment & Plan Note (Signed)
Chronic. Well controlled on current medication Continue telmisartan 20 mg daily Continue hydrochlorothiazide 12.5 mg daily Check Bmet

## 2023-06-09 NOTE — Assessment & Plan Note (Addendum)
Chronic. Not on statin.  Recent LDL 115, goal <100 Check fasting lipids Declines statin therapy Encourage healthy diet and increase activity

## 2023-06-22 ENCOUNTER — Other Ambulatory Visit: Payer: Self-pay | Admitting: Family Medicine

## 2023-06-22 ENCOUNTER — Telehealth: Payer: BC Managed Care – PPO | Admitting: Family Medicine

## 2023-06-22 DIAGNOSIS — Z1211 Encounter for screening for malignant neoplasm of colon: Secondary | ICD-10-CM

## 2023-06-22 DIAGNOSIS — Z6832 Body mass index (BMI) 32.0-32.9, adult: Secondary | ICD-10-CM

## 2023-06-25 ENCOUNTER — Other Ambulatory Visit: Payer: Self-pay | Admitting: Family Medicine

## 2023-06-25 DIAGNOSIS — F39 Unspecified mood [affective] disorder: Secondary | ICD-10-CM

## 2023-07-09 ENCOUNTER — Telehealth (INDEPENDENT_AMBULATORY_CARE_PROVIDER_SITE_OTHER): Payer: BC Managed Care – PPO | Admitting: Family Medicine

## 2023-07-09 ENCOUNTER — Encounter: Payer: Self-pay | Admitting: Family Medicine

## 2023-07-09 ENCOUNTER — Inpatient Hospital Stay: Admission: RE | Admit: 2023-07-09 | Payer: BC Managed Care – PPO | Source: Ambulatory Visit

## 2023-07-09 VITALS — Ht 64.0 in | Wt 186.0 lb

## 2023-07-09 DIAGNOSIS — E6609 Other obesity due to excess calories: Secondary | ICD-10-CM | POA: Diagnosis not present

## 2023-07-09 DIAGNOSIS — R7989 Other specified abnormal findings of blood chemistry: Secondary | ICD-10-CM | POA: Insufficient documentation

## 2023-07-09 DIAGNOSIS — E876 Hypokalemia: Secondary | ICD-10-CM | POA: Insufficient documentation

## 2023-07-09 DIAGNOSIS — Z6832 Body mass index (BMI) 32.0-32.9, adult: Secondary | ICD-10-CM | POA: Diagnosis not present

## 2023-07-09 MED ORDER — POTASSIUM CHLORIDE CRYS ER 20 MEQ PO TBCR
20.0000 meq | EXTENDED_RELEASE_TABLET | Freq: Every day | ORAL | 3 refills | Status: DC
Start: 2023-07-09 — End: 2024-06-12

## 2023-07-09 MED ORDER — WEGOVY 0.25 MG/0.5ML ~~LOC~~ SOAJ
0.5000 mg | SUBCUTANEOUS | 0 refills | Status: DC
Start: 2023-07-09 — End: 2023-08-29

## 2023-07-09 NOTE — Assessment & Plan Note (Signed)
Stop Vitamin D supplements Recheck levels at next visit

## 2023-07-09 NOTE — Assessment & Plan Note (Signed)
Chronic.  Stable.  Weight down 2 lbs since last visit Tolerating Wegovy 0.25 mg weekly Increase Wegovy to 0.5 mg weekly Recommend monitoring nutrition, and increase exercise to maintain current weight and help with further weight loss. Follow up on week 8 weeks

## 2023-07-09 NOTE — Assessment & Plan Note (Signed)
Stop Vitamin B 12 supplements Recheck level at next visit

## 2023-07-09 NOTE — Progress Notes (Signed)
Virtual Visit via Video note  I connected with Kelly Pittman on 07/09/23 at 1334 by video and verified that I am speaking with the correct person using two identifiers. Kelly Pittman is currently located at home and  is currently alone during visit. The provider, Kelly Allan, MD is located in their office at time of visit.  I discussed the limitations, risks, security and privacy concerns of performing an evaluation and management service by video and the availability of in person appointments. I also discussed with the patient that there may be a patient responsible charge related to this service. The patient expressed understanding and agreed to proceed.  Subjective: PCP: Kelly Allan, MD  Chief Complaint  Patient presents with   Medication Consultation    HPI Doing well on Wegovy 0.25 mg weekly.  Has lost 2lbs since last visit.  Tolerating medication well.  Denies any nausea, vomiting, constipation, abdominal pain.  Eating salads, limited protein. Hydrating well.   Requesting refill for Klor Con    ROS: Per HPI  Current Outpatient Medications:    calcium carbonate (CALCIUM 600) 600 MG TABS tablet, Take 600 mg by mouth daily., Disp: , Rfl:    escitalopram (LEXAPRO) 10 MG tablet, TAKE 1/2 TABLET BY MOUTH DAILY, Disp: 30 tablet, Rfl: 1   hydrochlorothiazide (HYDRODIURIL) 12.5 MG tablet, Take 1 tablet (12.5 mg total) by mouth every morning., Disp: 90 tablet, Rfl: 3   Insulin Pen Needle (NOVOFINE) 30G X 8 MM MISC, Inject 10 each into the skin as needed., Disp: 100 each, Rfl: 3   latanoprost (XALATAN) 0.005 % ophthalmic solution, 1 drop at bedtime., Disp: , Rfl:    levocetirizine (XYZAL) 5 MG tablet, Take 1 tablet (5 mg total) by mouth every evening., Disp: 90 tablet, Rfl: 3   mometasone (NASONEX) 50 MCG/ACT nasal spray, PLACE 2 SPRAYS INTO THE NOSE DAILY., Disp: 51 each, Rfl: 3   Multiple Vitamin (MULTIVITAMIN) capsule, Take by mouth daily. , Disp: , Rfl:     Nutritional Supplements (ESTROVEN PO), Take by mouth. , Disp: , Rfl:    pantoprazole (PROTONIX) 40 MG tablet, Take 1 tablet (40 mg total) by mouth daily., Disp: 90 tablet, Rfl: 3   telmisartan (MICARDIS) 20 MG tablet, Take 1 tablet (20 mg total) by mouth daily., Disp: 90 tablet, Rfl: 3   potassium chloride SA (KLOR-CON M) 20 MEQ tablet, Take 1 tablet (20 mEq total) by mouth daily., Disp: 90 tablet, Rfl: 3   Semaglutide-Weight Management (WEGOVY) 0.25 MG/0.5ML SOAJ, Inject 0.5 mg into the skin once a week., Disp: 4 mL, Rfl: 0  Observations/Objective: Physical Exam Pulmonary:     Effort: Pulmonary effort is normal.  Neurological:     Mental Status: She is alert and oriented to person, place, and time. Mental status is at baseline.  Psychiatric:        Mood and Affect: Mood normal.        Behavior: Behavior normal.        Thought Content: Thought content normal.        Judgment: Judgment normal.    Assessment and Plan: Class 1 obesity due to excess calories with serious comorbidity and body mass index (BMI) of 32.0 to 32.9 in adult Assessment & Plan: Chronic.  Stable.  Weight down 2 lbs since last visit Tolerating Wegovy 0.25 mg weekly Increase Wegovy to 0.5 mg weekly Recommend monitoring nutrition, and increase exercise to maintain current weight and help with further weight loss. Follow up on week  8 weeks  Orders: -     Wegovy; Inject 0.5 mg into the skin once a week.  Dispense: 4 mL; Refill: 0  Hypokalemia Assessment & Plan: Recent potassium 3.2 Likely from diuretic use Refill Potassium 20 mEg daily Recheck at next visit  Orders: -     Potassium Chloride Crys ER; Take 1 tablet (20 mEq total) by mouth daily.  Dispense: 90 tablet; Refill: 3  High serum vitamin D Assessment & Plan: Stop Vitamin D supplements Recheck levels at next visit   High serum vitamin B12 Assessment & Plan: Stop Vitamin B 12 supplements Recheck level at next visit     Follow Up  Instructions: Return in about 8 weeks (around 09/03/2023).   I discussed the assessment and treatment plan with the patient. The patient was provided an opportunity to ask questions and all were answered. The patient agreed with the plan and demonstrated an understanding of the instructions.   The patient was advised to call back or seek an in-person evaluation if the symptoms worsen or if the condition fails to improve as anticipated.  The above assessment and management plan was discussed with the patient. The patient verbalized understanding of and has agreed to the management plan. Patient is aware to call the clinic if symptoms persist or worsen. Patient is aware when to return to the clinic for a follow-up visit. Patient educated on when it is appropriate to go to the emergency department.    Kelly Allan, MD

## 2023-07-09 NOTE — Assessment & Plan Note (Signed)
Recent potassium 3.2 Likely from diuretic use Refill Potassium 20 mEg daily Recheck at next visit

## 2023-07-09 NOTE — Patient Instructions (Addendum)
It was a pleasure meeting you today. Thank you for allowing me to take part in your health care.  Our goals for today as we discussed include:  Increase Wegovy to 0.5 mg weekly On week 3 please notify me of any side effects Increase protein intake daily.  Follow up in 8 weeks  Refills sent for potassium  Stop Vitamin D supplements Stop Vitamin B 12 supplements Continue Calcium supplements 1200 mg daily recommendations  Will recheck labs at next visit   If you have any questions or concerns, please do not hesitate to call the office at [phone].  I look forward to our next visit and until then take care and stay safe.  Regards,   Dana Allan, MD   [facility]

## 2023-07-18 ENCOUNTER — Other Ambulatory Visit
Admission: RE | Admit: 2023-07-18 | Discharge: 2023-07-18 | Disposition: A | Discharge status: Home / Self Care | Payer: BC Managed Care – PPO | Source: Ambulatory Visit | Attending: Orthopedic Surgery | Admitting: Orthopedic Surgery

## 2023-07-18 DIAGNOSIS — M249 Joint derangement, unspecified: Secondary | ICD-10-CM | POA: Insufficient documentation

## 2023-07-18 DIAGNOSIS — M24811 Other specific joint derangements of right shoulder, not elsewhere classified: Secondary | ICD-10-CM | POA: Insufficient documentation

## 2023-07-18 LAB — COMPREHENSIVE METABOLIC PANEL
ALT: 17 U/L (ref 0–44)
AST: 18 U/L (ref 15–41)
Albumin: 4.1 g/dL (ref 3.5–5.0)
Alkaline Phosphatase: 79 U/L (ref 38–126)
Anion gap: 7 (ref 5–15)
BUN: 13 mg/dL (ref 6–20)
CO2: 26 mmol/L (ref 22–32)
Calcium: 9.1 mg/dL (ref 8.9–10.3)
Chloride: 101 mmol/L (ref 98–111)
Creatinine, Ser: 0.81 mg/dL (ref 0.44–1.00)
GFR, Estimated: >60 mL/min (ref >60)
Glucose, Bld: 88 mg/dL (ref 70–99)
Potassium: 3.5 mmol/L (ref 3.5–5.1)
Sodium: 134 mmol/L — ABNORMAL LOW (ref 135–145)
Total Bilirubin: 0.2 mg/dL — ABNORMAL LOW (ref 0.3–1.2)
Total Protein: 7.1 g/dL (ref 6.5–8.1)

## 2023-07-18 LAB — CBC WITH DIFFERENTIAL/PLATELET
Abs Immature Granulocytes: 0.01 10*3/uL (ref 0.00–0.07)
Basophils Absolute: 0 10*3/uL (ref 0.0–0.1)
Basophils Relative: 0 %
Eosinophils Absolute: 0.3 10*3/uL (ref 0.0–0.5)
Eosinophils Relative: 5 %
HCT: 37.7 % (ref 36.0–46.0)
Hemoglobin: 12.3 g/dL (ref 12.0–15.0)
Immature Granulocytes: 0 %
Lymphocytes Relative: 51 %
Lymphs Abs: 3.5 10*3/uL (ref 0.7–4.0)
MCH: 30.2 pg (ref 26.0–34.0)
MCHC: 32.6 g/dL (ref 30.0–36.0)
MCV: 92.6 fL (ref 80.0–100.0)
Monocytes Absolute: 0.4 10*3/uL (ref 0.1–1.0)
Monocytes Relative: 6 %
Neutro Abs: 2.6 10*3/uL (ref 1.7–7.7)
Neutrophils Relative %: 38 %
Platelets: 291 10*3/uL (ref 150–400)
RBC: 4.07 MIL/uL (ref 3.87–5.11)
RDW: 13.4 % (ref 11.5–15.5)
WBC: 6.8 10*3/uL (ref 4.0–10.5)
nRBC: 0 % (ref 0.0–0.2)

## 2023-07-19 ENCOUNTER — Ambulatory Visit: Admit: 2023-07-19 | Payer: BC Managed Care – PPO | Admitting: Orthopedic Surgery

## 2023-07-19 SURGERY — REPAIR, ROTATOR CUFF, OPEN
Anesthesia: Choice | Site: Shoulder | Laterality: Right

## 2023-08-07 ENCOUNTER — Telehealth: Payer: Self-pay | Admitting: Family Medicine

## 2023-08-07 NOTE — Telephone Encounter (Signed)
Pt would like to be called regarding her medication and possibly being put back on vitamin B

## 2023-08-08 ENCOUNTER — Other Ambulatory Visit: Payer: Self-pay | Admitting: Family

## 2023-08-08 DIAGNOSIS — J309 Allergic rhinitis, unspecified: Secondary | ICD-10-CM

## 2023-08-09 NOTE — Telephone Encounter (Signed)
Called pt and informed her of this information.

## 2023-08-20 ENCOUNTER — Telehealth: Payer: Self-pay

## 2023-08-20 ENCOUNTER — Telehealth: Payer: Self-pay | Admitting: Family Medicine

## 2023-08-20 DIAGNOSIS — J309 Allergic rhinitis, unspecified: Secondary | ICD-10-CM

## 2023-08-20 MED ORDER — MOMETASONE FUROATE 50 MCG/ACT NA SUSP: NASAL | 3 refills | Status: DC

## 2023-08-20 NOTE — Telephone Encounter (Signed)
RX request for mometasone (NASONEX) 50 MCG/ACT nasal spray

## 2023-08-20 NOTE — Telephone Encounter (Signed)
Sent to pharmacy 

## 2023-08-20 NOTE — Telephone Encounter (Signed)
Prescription Request  08/20/2023  LOV: 05/14/2023  What is the name of the medication or equipment?  mometasone (NASONEX) 50 MCG/ACT nasal spray   Have you contacted your pharmacy to request a refill? Yes   Which pharmacy would you like this sent to?    [facility] - [place], [place] - [address] Portal to [facility] [employer] [facility] [address] [place] [address] Phone: [phone] Fax: [phone]     Patient notified that their request is being sent to the clinical staff for review and that they should receive a response within 2 business days.   Please advise at Mobile [phone] (mobile)

## 2023-08-21 DIAGNOSIS — M25511 Pain in right shoulder: Secondary | ICD-10-CM | POA: Insufficient documentation

## 2023-08-21 DIAGNOSIS — M25611 Stiffness of right shoulder, not elsewhere classified: Secondary | ICD-10-CM | POA: Insufficient documentation

## 2023-08-24 NOTE — Telephone Encounter (Signed)
Patient states Semaglutide-Weight Management (WEGOVY) 0.25 MG/0.5ML SOAJ is on back order the 0.5, patient would like to know if she can be placed back on the 0.25 because has been off waiting for pharmacy to get it back in stock. Please call patient

## 2023-08-28 ENCOUNTER — Telehealth: Payer: Self-pay | Admitting: Family Medicine

## 2023-08-28 NOTE — Telephone Encounter (Signed)
Patient called and states Semaglutide-Weight Management (WEGOVY) 0.25 MG/0.5ML SOAJ  0.5 mghas been out of stock for four weeks so she has not taken it for four week and would like to go back down to the 0.25mg . Please advise

## 2023-08-29 ENCOUNTER — Other Ambulatory Visit: Payer: Self-pay | Admitting: Family Medicine

## 2023-08-29 DIAGNOSIS — E66811 Obesity, class 1: Secondary | ICD-10-CM

## 2023-08-29 MED ORDER — WEGOVY 0.25 MG/0.5ML ~~LOC~~ SOAJ
0.5000 mg | SUBCUTANEOUS | 2 refills | Status: DC
Start: 2023-08-29 — End: 2023-08-30

## 2023-08-29 NOTE — Telephone Encounter (Signed)
Patient called to see status of med refill.

## 2023-08-29 NOTE — Telephone Encounter (Signed)
Patient just called and is checking the status of her medication. She said [facility] on [address] has the medication. They are just waiting for the approval. Her number is [phone].

## 2023-08-30 ENCOUNTER — Other Ambulatory Visit: Payer: Self-pay

## 2023-08-30 DIAGNOSIS — E6609 Other obesity due to excess calories: Secondary | ICD-10-CM

## 2023-08-30 MED ORDER — WEGOVY 0.25 MG/0.5ML ~~LOC~~ SOAJ: 0.5000 mg | SUBCUTANEOUS | 2 refills | Status: DC

## 2023-08-30 NOTE — Telephone Encounter (Signed)
Spoke with pt. Refill sent to [facility] on [address] as requested.

## 2023-09-03 ENCOUNTER — Encounter: Payer: Self-pay | Admitting: Family Medicine

## 2023-09-03 ENCOUNTER — Ambulatory Visit: Payer: BC Managed Care – PPO | Admitting: Family Medicine

## 2023-09-03 VITALS — BP 132/68 | HR 70 | Temp 99.4°F | Ht 64.0 in | Wt 194.4 lb

## 2023-09-03 DIAGNOSIS — R0981 Nasal congestion: Secondary | ICD-10-CM | POA: Diagnosis not present

## 2023-09-03 DIAGNOSIS — Z8619 Personal history of other infectious and parasitic diseases: Secondary | ICD-10-CM | POA: Diagnosis not present

## 2023-09-03 DIAGNOSIS — F39 Unspecified mood [affective] disorder: Secondary | ICD-10-CM

## 2023-09-03 DIAGNOSIS — Z1231 Encounter for screening mammogram for malignant neoplasm of breast: Secondary | ICD-10-CM

## 2023-09-03 DIAGNOSIS — Z6835 Body mass index (BMI) 35.0-35.9, adult: Secondary | ICD-10-CM

## 2023-09-03 DIAGNOSIS — E66812 Obesity, class 2: Secondary | ICD-10-CM

## 2023-09-03 DIAGNOSIS — I10 Essential (primary) hypertension: Secondary | ICD-10-CM | POA: Diagnosis not present

## 2023-09-03 MED ORDER — NYSTATIN 100000 UNIT/ML MT SUSP
5.0000 mL | Freq: Four times a day (QID) | OROMUCOSAL | 0 refills | Status: AC
Start: 2023-09-03 — End: 2023-09-08

## 2023-09-03 NOTE — Progress Notes (Unsigned)
   SUBJECTIVE:   Chief Complaint  Patient presents with   Medical Management of Chronic Issues    8 week follow up   HPI Follow up weight management  PERTINENT PMH / PSH: As above  OBJECTIVE:  BP 132/68   Pulse 70   Temp 99.4 F (37.4 C)   Ht 5\' 4"  (1.626 m)   Wt 194 lb 6.4 oz (88.2 kg)   SpO2 99%   BMI 33.37 kg/m    Physical Exam     05/14/2023   10:00 AM 11/07/2022    1:17 PM 04/28/2022    2:30 PM 09/23/2020    8:16 AM 02/11/2020    9:25 AM  Depression screen PHQ 2/9  Decreased Interest 0 0 0 0 0  Down, Depressed, Hopeless 0 0 0 0 0  PHQ - 2 Score 0 0 0 0 0      02/11/2020    9:25 AM  GAD 7 : Generalized Anxiety Score  Nervous, Anxious, on Edge 0  Control/stop worrying 0  Worry too much - different things 0  Trouble relaxing 0  Restless 0  Easily annoyed or irritable 0  Afraid - awful might happen 0  Total GAD 7 Score 0  Anxiety Difficulty Not difficult at all    ASSESSMENT/PLAN:  Breast cancer screening by mammogram -     3D Screening Mammogram, Left and Right; Future  History of thrush -     Nystatin; Take 5 mLs (500,000 Units total) by mouth 4 (four) times daily for 5 days.  Dispense: 60 mL; Refill: 0   PDMP reviewed***  Return if symptoms worsen or fail to improve, for PCP.  Dana Allan, MD

## 2023-09-03 NOTE — Patient Instructions (Addendum)
It was a pleasure meeting you today. Thank you for allowing me to take part in your health care.  Our goals for today as we discussed include:  Restart Nystatin 4 times a day for thrush  Continue Wegovy 0.25 mg weekly    This is a list of the screening recommended for you and due dates:  Health Maintenance  Topic Date Due   COVID-19 Vaccine (4 - 2023-24 season) 07/15/2023   Mammogram  08/22/2023   Flu Shot  02/11/2024*   Pap with HPV screening  09/02/2024*   Colon Cancer Screening  01/02/2027   DTaP/Tdap/Td vaccine (3 - Td or Tdap) 03/31/2027   Hepatitis C Screening  Completed   HIV Screening  Completed   Zoster (Shingles) Vaccine  Completed   HPV Vaccine  Aged Out  *Topic was postponed. The date shown is not the original due date.     Follow up as needed  If you have any questions or concerns, please do not hesitate to call the office at [phone].  I look forward to our next visit and until then take care and stay safe.  Regards,   Dana Allan, MD   [facility]

## 2023-09-04 ENCOUNTER — Other Ambulatory Visit: Payer: Self-pay | Admitting: Family Medicine

## 2023-09-04 DIAGNOSIS — K219 Gastro-esophageal reflux disease without esophagitis: Secondary | ICD-10-CM

## 2023-09-04 DIAGNOSIS — F39 Unspecified mood [affective] disorder: Secondary | ICD-10-CM

## 2023-09-04 DIAGNOSIS — I10 Essential (primary) hypertension: Secondary | ICD-10-CM

## 2023-09-04 MED ORDER — PANTOPRAZOLE SODIUM 40 MG PO TBEC
40.0000 mg | DELAYED_RELEASE_TABLET | Freq: Every day | ORAL | 3 refills | Status: DC
Start: 2023-09-04 — End: 2024-06-30

## 2023-09-04 MED ORDER — HYDROCHLOROTHIAZIDE 12.5 MG PO TABS: 12.5000 mg | ORAL_TABLET | Freq: Every morning | ORAL | 3 refills | Status: DC

## 2023-09-05 ENCOUNTER — Encounter: Payer: Self-pay | Admitting: Family Medicine

## 2023-09-05 DIAGNOSIS — Z8619 Personal history of other infectious and parasitic diseases: Secondary | ICD-10-CM | POA: Insufficient documentation

## 2023-09-05 DIAGNOSIS — Z1231 Encounter for screening mammogram for malignant neoplasm of breast: Secondary | ICD-10-CM | POA: Insufficient documentation

## 2023-09-05 NOTE — Assessment & Plan Note (Addendum)
On low dose Telmisartan and Hydrochlorothiazide.  -Continue current antihypertensive regimen. -Refill hydrochlorothiazide 12.5 mg daily

## 2023-09-05 NOTE — Assessment & Plan Note (Signed)
Managed with SSRi. Refill Lexapro 5 mg daily Encouraged CBT Follow up as needed

## 2023-09-05 NOTE — Assessment & Plan Note (Signed)
Reports sensation of "pimples" on tongue and roof of mouth, burning sensation, no visible lesions or blisters. Previous treatment for thrush did not resolve symptoms. -Prescribe Nystatin for 5 days to rule out persistent thrush.

## 2023-09-05 NOTE — Assessment & Plan Note (Signed)
Requested refills for Mometasone nasal spray. -Refill Mometasone

## 2023-09-05 NOTE — Assessment & Plan Note (Signed)
Recently restarted Wegovy 0.25mg  due to supply issues. Previously tolerated 0.25mg  for two months and was ready to increase to 0.5mg . -Continue Wegovy 0.25mg  weekly. -Plan to increase to 0.5mg  in three weeks if supply is available.

## 2023-09-13 ENCOUNTER — Telehealth: Payer: Self-pay | Admitting: Family Medicine

## 2023-09-13 ENCOUNTER — Other Ambulatory Visit: Payer: Self-pay

## 2023-09-13 DIAGNOSIS — E6609 Other obesity due to excess calories: Secondary | ICD-10-CM

## 2023-09-13 MED ORDER — WEGOVY 0.25 MG/0.5ML ~~LOC~~ SOAJ: 0.5000 mg | SUBCUTANEOUS | 2 refills | Status: DC

## 2023-09-13 NOTE — Telephone Encounter (Signed)
Refill resent [facility] stated that they never received the rx.

## 2023-09-13 NOTE — Telephone Encounter (Signed)
Prescription Request  09/13/2023  LOV: 09/03/2023  What is the name of the medication or equipment? Semaglutide-Weight Management (WEGOVY) 0.25 MG/0.5ML SOAJ  Have you contacted your pharmacy to request a refill? No   Which pharmacy would you like this sent to?   [facility] [address] ([address]), [place] - [address] [place]) [place] [address] Phone: [phone] Fax: [phone]     Patient notified that their request is being sent to the clinical staff for review and that they should receive a response within 2 business days.   Please advise at Mobile [phone] (mobile)

## 2023-09-18 MED ORDER — SEMAGLUTIDE-WEIGHT MANAGEMENT 0.5 MG/0.5ML ~~LOC~~ SOAJ: 0.5000 mg | SUBCUTANEOUS | 2 refills | Status: DC

## 2023-09-18 NOTE — Telephone Encounter (Signed)
Patient states she is following-up on her previous message.  Patient states her pharmacy, [facility] on [address], has not received her prescription for Semaglutide-Weight Management (WEGOVY) 0.25 MG/0.5ML SOAJ.  I let patient know that our system shows that [facility] on [address] confirmed receipt of prescription on 09/13/2023 at 2:49pm.  Patient states she spoke with them today and they have not received it.  Patient states she is a little discouraged since she called last week.  Patient states this is her second phone call asking for this and it has not been done yet.  Patient states she is suppose to have the dosage increased from .25 to .5  Patient states she wants to get her prescription filled right now while the pharmacy has it in stock. Patient states she is concerned about them running out of this medication before her prescription is filled.  Patient states she will be able to pick it up on Thursday of this week. Patient states she is supposed to start taking the increased dose next week.

## 2023-09-18 NOTE — Addendum Note (Signed)
Addended by: Vertis Kelch on: 09/18/2023 04:35 PM   Modules accepted: Orders

## 2023-09-21 ENCOUNTER — Other Ambulatory Visit: Payer: Self-pay

## 2023-09-21 DIAGNOSIS — I10 Essential (primary) hypertension: Secondary | ICD-10-CM

## 2023-09-21 MED ORDER — TELMISARTAN 20 MG PO TABS: 20.0000 mg | ORAL_TABLET | Freq: Every day | ORAL | 3 refills | Status: DC

## 2023-11-01 ENCOUNTER — Encounter: Payer: Self-pay | Admitting: Nurse Practitioner

## 2023-11-01 ENCOUNTER — Ambulatory Visit: Payer: BC Managed Care – PPO | Admitting: Nurse Practitioner

## 2023-11-01 VITALS — BP 120/76 | HR 61 | Temp 98.4°F | Ht 64.0 in | Wt 187.4 lb

## 2023-11-01 DIAGNOSIS — M7989 Other specified soft tissue disorders: Secondary | ICD-10-CM | POA: Diagnosis not present

## 2023-11-01 MED ORDER — MELOXICAM 7.5 MG PO TABS: 7.5000 mg | ORAL_TABLET | Freq: Every day | ORAL | 0 refills | Status: DC

## 2023-11-01 NOTE — Patient Instructions (Addendum)
Wrap the foot with ace wrap, Keep it elevated. Please go to [facility] outpatient imaging for X ray.

## 2023-11-01 NOTE — Progress Notes (Unsigned)
Established Patient Office Visit  Subjective:  Patient ID: Kelly Pittman, female    DOB: 08-Dec-1965  Age: 57 y.o. MRN: [id number]  CC:  Chief Complaint  Patient presents with   Acute Visit    Right foot swollen and painful x 1 week Pain 8 / 10    HPI  Kelly Pittman presents for swollen right foot from 1 week. Patient denies any foot injury.  Patient reports pain as 8/10.  She denies any numbness, tingling or muscle weakness.  She is trying to keep the foot elevated.  Patient states she has recently had a right rotator cuff repair and has been doing physical therapy.  She has history of plantar fasciitis and had right foot surgery in December 2017. 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  08/2018: low titer ANA 1/80 nucleolar speckled  Normal RF/CCP   Post-nasal drip 02/18/2015   UTI (urinary tract infection)     Past Surgical History:  Procedure Laterality Date   ABDOMINAL HYSTERECTOMY     2009   BREAST BIOPSY Right 2003   CARPAL TUNNEL RELEASE     COLONOSCOPY WITH PROPOFOL N/A 05/18/2020   Procedure: COLONOSCOPY WITH PROPOFOL;  Surgeon: Wyline Mood, MD;  Location: [facility] ENDOSCOPY;  Service: Gastroenterology;  Laterality: N/A;   COLONOSCOPY WITH PROPOFOL N/A 05/19/2020   Procedure: COLONOSCOPY WITH PROPOFOL;  Surgeon: Wyline Mood, MD;  Location: [facility] ENDOSCOPY;  Service: Gastroenterology;  Laterality: N/A;   FOOT SURGERY     right   TONSILLECTOMY     1987   UNILATERAL SALPINGECTOMY      left    Family History  Problem Relation Age of Onset   Hypertension Mother    Hyperlipidemia Mother    Hypertension Father    Cancer Cousin        bone cancer   Breast cancer Neg Hx    Ovarian cancer Neg Hx    Stroke Neg Hx    Thyroid disease Neg Hx     Social History   Socioeconomic History   Marital status: Married    Spouse name: Not on file   Number of children: Not on file   Years of education: Not on file   Highest education level: Not on file  Occupational History   Not on file  Tobacco Use   Smoking status: Former    Current packs/day: 0.00    Types: Cigarettes    Quit date: 2020    Years since quitting: 5.0   Smokeless tobacco: Never  Vaping Use   Vaping status: Never Used  Substance and Sexual Activity   Alcohol use: Yes    Comment: socially   Drug use: No   Sexual activity: Yes    Birth control/protection: Surgical  Other Topics Concern   Not on file  Social History Narrative   Married    Adopted 18 y.o daughter as 05/13/21   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     escitalopram (LEXAPRO) 10 MG tablet TAKE 1/2 TABLET BY MOUTH DAILY 45 tablet 2   hydrochlorothiazide (HYDRODIURIL) 12.5 MG tablet Take 1 tablet (12.5 mg total) by mouth every morning. 90 tablet 3   ibuprofen (ADVIL) 800 MG tablet Take 800 mg by mouth every 8 (eight) hours as needed for moderate pain (pain  score 4-6).     Insulin Pen Needle (NOVOFINE) 30G X 8 MM MISC Inject 10 each into the skin as needed. 100 each 3   latanoprost (XALATAN) 0.005 % ophthalmic solution 1 drop at bedtime.     levocetirizine (XYZAL) 5 MG tablet Take 1 tablet (5 mg total) by mouth every evening. 90 tablet 3   mometasone (NASONEX) 50 MCG/ACT nasal spray PLACE 2 SPRAYS INTO THE NOSE DAILY. 51 each 3   Multiple Vitamin (MULTIVITAMIN) capsule Take by mouth daily.      Nutritional Supplements (ESTROVEN PO) Take by mouth.      pantoprazole (PROTONIX) 40 MG tablet Take 1 tablet (40 mg total) by mouth daily. 90 tablet 3   potassium chloride SA (KLOR-CON M) 20 MEQ tablet Take 1 tablet (20 mEq total) by mouth daily. 90 tablet 3   Semaglutide-Weight Management 0.5 MG/0.5ML SOAJ Inject 0.5 mg into the skin once a week. 4 mL 2   telmisartan (MICARDIS) 20 MG tablet Take 1 tablet (20 mg total) by mouth daily. 90 tablet 3   No facility-administered medications prior to visit.    Allergies  Allergen Reactions   Losartan Potassium-Hctz     Other reaction(s): Other (See Comments) Weight gain, constipation   Tape Rash    ROS Review of Systems Negative unless indicated in HPI.    Objective:    Physical Exam Constitutional:      Appearance: Normal appearance.  Cardiovascular:     Rate and Rhythm: Normal rate and regular rhythm.     Pulses: Normal pulses.     Heart sounds: Normal heart sounds.  Pulmonary:     Effort: Pulmonary effort is normal.     Breath sounds: Normal breath sounds. No stridor. No wheezing.  Musculoskeletal:     Right foot: Swelling and tenderness present.  Skin:    General: Skin is warm.     Findings: No rash.  Neurological:     General: No focal deficit present.     Mental Status: She is alert and oriented to person, place, and time. Mental status is at baseline.  Psychiatric:        Mood and Affect: Mood normal.        Behavior: Behavior normal.        Thought Content: Thought content  normal.     BP 120/76   Pulse 61   Temp 98.4 F (36.9 C)   Ht  5\' 4"  (1.626 m)   Wt 187 lb 6.4 oz (85 kg)   SpO2 98%   BMI 32.17 kg/m  Wt Readings from Last 3 Encounters:  11/01/23 187 lb 6.4 oz (85 kg)  09/03/23 194 lb 6.4 oz (88.2 kg)  07/09/23 186 lb (84.4 kg)     Health Maintenance  Topic Date Due   MAMMOGRAM  08/22/2023   COVID-19 Vaccine (4 - 2024-25 season) 11/17/2023 (Originally 07/15/2023)   INFLUENZA VACCINE  02/11/2024 (Originally 06/14/2023)   Cervical Cancer Screening (HPV/Pap Cotest)  09/02/2024 (Originally 01/11/1996)   Colonoscopy  01/02/2027   DTaP/Tdap/Td (3 - Td or Tdap) 03/31/2027   Hepatitis C Screening  Completed   HIV Screening  Completed   Zoster Vaccines- Shingrix  Completed   HPV VACCINES  Aged Out    There are no preventive care reminders to display for this patient.  Lab Results  Component Value Date   TSH 0.65 05/14/2023   Lab Results  Component Value Date   WBC 6.8 07/18/2023   HGB 12.3 07/18/2023   HCT 37.7 07/18/2023   MCV 92.6 07/18/2023   PLT 291 07/18/2023   Lab Results  Component Value Date   NA 134 (L) 07/18/2023   K 3.5 07/18/2023   CO2 26 07/18/2023   GLUCOSE 88 07/18/2023   BUN 13 07/18/2023   CREATININE 0.81 07/18/2023   BILITOT 0.2 (L) 07/18/2023   ALKPHOS 79 07/18/2023   AST 18 07/18/2023   ALT 17 07/18/2023   PROT 7.1 07/18/2023   ALBUMIN 4.1 07/18/2023   CALCIUM 9.1 07/18/2023   ANIONGAP 7 07/18/2023   GFR 80.63 05/14/2023   Lab Results  Component Value Date   CHOL 172 05/14/2023   Lab Results  Component Value Date   HDL 48.30 05/14/2023   Lab Results  Component Value Date   LDLCALC 109 (H) 05/14/2023   Lab Results  Component Value Date   TRIG 71.0 05/14/2023   Lab Results  Component Value Date   CHOLHDL 4 05/14/2023   Lab Results  Component Value Date   HGBA1C 5.4 05/14/2023      Assessment & Plan:  Foot swelling Assessment & Plan: Unilateral right foot swelling without history of  trauma or injury, no sign of infection. Advised patient to use compression stocking or wrap to improve circulation. Keep the foot elevated, reduce intake of sodium. Will treat with meloxicam 7.5 mg  X-ray shows no sign of fracture, shows posterior and plantar calcaneal spurs  Patient will let us know if symptoms not improving.  Orders: -     DG Foot 2 Views Right; Future  Other orders -     Meloxicam; Take 1 tablet (7.5 mg total) by mouth daily.  Dispense: 20 tablet; Refill: 0    Follow-up: Return if symptoms worsen or fail to improve.   Kara Dies, NP

## 2023-11-06 ENCOUNTER — Ambulatory Visit
Admission: RE | Admit: 2023-11-06 | Discharge: 2023-11-06 | Disposition: A | Discharge status: Home / Self Care | Payer: BC Managed Care – PPO | Source: Ambulatory Visit | Attending: Nurse Practitioner | Admitting: Nurse Practitioner

## 2023-11-06 ENCOUNTER — Ambulatory Visit
Admission: RE | Admit: 2023-11-06 | Discharge: 2023-11-06 | Disposition: A | Discharge status: Home / Self Care | Payer: BC Managed Care – PPO | Attending: Nurse Practitioner | Admitting: Nurse Practitioner

## 2023-11-06 DIAGNOSIS — M7989 Other specified soft tissue disorders: Secondary | ICD-10-CM | POA: Insufficient documentation

## 2023-11-12 ENCOUNTER — Encounter: Payer: Self-pay | Admitting: Nurse Practitioner

## 2023-11-12 DIAGNOSIS — M7989 Other specified soft tissue disorders: Secondary | ICD-10-CM | POA: Insufficient documentation

## 2023-11-12 NOTE — Assessment & Plan Note (Addendum)
Unilateral right foot swelling without history of trauma or injury, no sign of infection. Advised patient to use compression stocking or wrap to improve circulation. Keep the foot elevated, reduce intake of sodium. Will treat with meloxicam 7.5 mg  X-ray shows no sign of fracture, shows posterior and plantar calcaneal spurs  Patient will let us know if symptoms not improving.

## 2023-11-20 ENCOUNTER — Telehealth: Payer: Self-pay

## 2023-11-20 NOTE — Telephone Encounter (Signed)
 Copied from CRM [id number]. Topic: Clinical - Medication Question >> Nov 20, 2023  1:44 PM Pinkey ORN wrote: Reason for CRM: Medication Coverage Approval >> Nov 20, 2023  1:47 PM Pinkey ORN wrote: Patient states she reached out to the pharmacy is regards to her medication Wegovy  and they're stating that she needs a coverage approval from her provider.

## 2023-11-26 ENCOUNTER — Telehealth: Payer: Self-pay | Admitting: Family Medicine

## 2023-11-26 NOTE — Telephone Encounter (Signed)
Resent to PA

## 2023-11-26 NOTE — Telephone Encounter (Signed)
 Copied from CRM [id number]. Topic: Clinical - Medication Question >> Nov 26, 2023 11:26 AM Maisie BROCKS wrote: Reason for CRM: pt called back to follow-up on the process of PA for Wegovy . I informed her that one of the nurses routed the message to the prior auth team and that they are working on it, She asked for a callback with updates at [phone] [facility]). Please call and advise.

## 2023-11-27 ENCOUNTER — Telehealth: Payer: Self-pay

## 2023-11-27 ENCOUNTER — Other Ambulatory Visit (HOSPITAL_COMMUNITY): Payer: Self-pay

## 2023-11-27 NOTE — Telephone Encounter (Signed)
 Pharmacy Patient Advocate Encounter   Received notification from Pt Calls Messages that prior authorization for [facility] 0.37ml/dose is required/requested.   Insurance verification completed.   The patient is insured through [facility] ADVANTAGE/RX ADVANCE .   Per test claim: Refill too soon. PA is not needed at this time. Medication was filled 11/13/2023. Next eligible fill date is 12/04/2023.   CURRENT PA ENDS 12/19/2023

## 2023-11-27 NOTE — Telephone Encounter (Signed)
 Called and pt stated that she was going calling ahead of time so that when the PA is up in Feb it would have all ready been done.

## 2023-11-27 NOTE — Telephone Encounter (Signed)
 Per test claim: Refill too soon. PA is not needed at this time. Medication was filled 11/13/2023. Next eligible fill date is 12/04/2023.  PA expires 12/19/2023, postponing encounter to closer to PA end date

## 2023-12-03 ENCOUNTER — Other Ambulatory Visit (HOSPITAL_COMMUNITY): Payer: Self-pay

## 2023-12-03 NOTE — Telephone Encounter (Signed)
Pharmacy Patient Advocate Encounter   Received notification from Pt Calls Messages that prior authorization for [facility] 0.37ml/dose is required/requested.   Insurance verification completed.   The patient is insured through [facility] ADVANTAGE/RX ADVANCE .   Per test claim: Refill too soon. PA is not needed at this time. Medication was filled 11/13/2023. Next eligible fill date is 12/04/2023.   CURRENT PA ENDS 12/19/2023

## 2023-12-04 NOTE — Telephone Encounter (Signed)
Called and pt stated that she was going calling ahead of time so that when the PA is up in Feb it would have all ready been done.

## 2023-12-06 ENCOUNTER — Other Ambulatory Visit: Payer: Self-pay | Admitting: Family Medicine

## 2023-12-11 ENCOUNTER — Telehealth: Payer: Self-pay | Admitting: Family Medicine

## 2023-12-11 ENCOUNTER — Other Ambulatory Visit: Payer: Self-pay | Admitting: Family

## 2023-12-11 MED ORDER — ALPRAZOLAM 0.25 MG PO TABS: 0.2500 mg | ORAL_TABLET | Freq: Every day | ORAL | 0 refills | Status: DC

## 2023-12-11 NOTE — Telephone Encounter (Unsigned)
Copied from CRM [id number]. Topic: Clinical - Medication Refill >> Dec 11, 2023 11:05 AM Almira Coaster wrote: Most Recent Primary Care Visit:  Provider: Kara Dies  Department: [facility]  Visit Type: ACUTE  Date: 11/01/2023  Medication: xanax  Has the patient contacted their pharmacy? No, per patient Dr.Walsh asked her to call in whenever she required this medication. (Agent: If no, request that the patient contact the pharmacy for the refill. If patient does not wish to contact the pharmacy document the reason why and proceed with request.) (Agent: If yes, when and what did the pharmacy advise?)  Is this the correct pharmacy for this prescription? Yes If no, delete pharmacy and type the correct one.  This is the patient's preferred pharmacy:    [facility] [address], [place] - [address] [place] [address] Phone: [phone] Fax: [phone]    Has the prescription been filled recently? No  Is the patient out of the medication? Yes  Has the patient been seen for an appointment in the last year OR does the patient have an upcoming appointment? Yes  Can we respond through MyChart? Yes  Agent: Please be advised that Rx refills may take up to 3 business days. We ask that you follow-up with your pharmacy.

## 2023-12-12 NOTE — Telephone Encounter (Signed)
Pt.notified

## 2023-12-12 NOTE — Telephone Encounter (Signed)
Vm left for pt informing pt that a temporary supply was filled and that future refills will need to be address with a visit. Okay to give message if pt calls back

## 2023-12-13 ENCOUNTER — Other Ambulatory Visit (HOSPITAL_COMMUNITY): Payer: Self-pay

## 2023-12-31 ENCOUNTER — Ambulatory Visit
Admission: RE | Admit: 2023-12-31 | Discharge: 2023-12-31 | Disposition: A | Discharge status: Home / Self Care | Payer: BC Managed Care – PPO | Source: Ambulatory Visit | Attending: Family Medicine | Admitting: Family Medicine

## 2023-12-31 DIAGNOSIS — Z1231 Encounter for screening mammogram for malignant neoplasm of breast: Secondary | ICD-10-CM | POA: Insufficient documentation

## 2024-01-02 ENCOUNTER — Telehealth: Payer: Self-pay

## 2024-01-02 ENCOUNTER — Other Ambulatory Visit (HOSPITAL_COMMUNITY): Payer: Self-pay

## 2024-01-02 NOTE — Telephone Encounter (Signed)
PA has been submitted and documented in separate encounter, please see encounter dated 01/02/2024 for follow up.

## 2024-01-02 NOTE — Telephone Encounter (Signed)
Pharmacy Patient Advocate Encounter   Received notification from Pt Calls Messages that prior authorization for Wegovy 0.5MG /0.5ML auto-injectors is required/requested.   Insurance verification completed.   The patient is insured through [facility] .   Per test claim: PA required; PA submitted to above mentioned insurance via CoverMyMeds Key/confirmation #/EOC [id number] Status is pending

## 2024-01-03 ENCOUNTER — Other Ambulatory Visit: Payer: Self-pay | Admitting: Family

## 2024-01-03 DIAGNOSIS — E66812 Obesity, class 2: Secondary | ICD-10-CM

## 2024-01-04 NOTE — Telephone Encounter (Signed)
Pharmacy Patient Advocate Encounter  Received notification from [facility] that Prior Authorization for Reginal Lutes has been APPROVED from 01/02/2024 to 08/01/2024   PA #/Case ID/Reference #: [id number] EO

## 2024-01-04 NOTE — Telephone Encounter (Signed)
 Pt informed of this information.

## 2024-01-07 ENCOUNTER — Other Ambulatory Visit: Payer: Self-pay | Admitting: Family Medicine

## 2024-01-07 NOTE — Telephone Encounter (Signed)
 Copied from CRM [id number]. Topic: Clinical - Medication Refill >> Jan 07, 2024 12:39 PM Almira Coaster wrote: Most Recent Primary Care Visit:  Provider: Kara Dies  Department: [facility]  Visit Type: ACUTE  Date: 11/01/2023  Medication: WGNFAO  Has the patient contacted their pharmacy? Yes, pharmacy emailed patient to contact pcp's office (Agent: If no, request that the patient contact the pharmacy for the refill. If patient does not wish to contact the pharmacy document the reason why and proceed with request.) (Agent: If yes, when and what did the pharmacy advise?)  Is this the correct pharmacy for this prescription? Yes If no, delete pharmacy and type the correct one.  This is the patient's preferred pharmacy:  [facility] [address] ([address]), [place] - [address] [place]) [place] [address] Phone: [phone] Fax: [phone]    Has the prescription been filled recently? No  Is the patient out of the medication? Yes  Has the patient been seen for an appointment in the last year OR does the patient have an upcoming appointment? Yes  Can we respond through MyChart? Yes  Agent: Please be advised that Rx refills may take up to 3 business days. We ask that you follow-up with your pharmacy.

## 2024-01-08 ENCOUNTER — Telehealth: Payer: Self-pay | Admitting: Obstetrics

## 2024-01-08 ENCOUNTER — Other Ambulatory Visit: Payer: Self-pay

## 2024-01-08 DIAGNOSIS — E66812 Obesity, class 2: Secondary | ICD-10-CM

## 2024-01-08 MED ORDER — WEGOVY 0.5 MG/0.5ML ~~LOC~~ SOAJ: 0.5000 mg | SUBCUTANEOUS | 0 refills | Status: DC

## 2024-01-08 NOTE — Telephone Encounter (Signed)
 Contacted the patient via phone x2. Left message for the patient to contacted our office for rescheduled 3/10 with Dr Lonny Prude. She is in surgery.

## 2024-01-08 NOTE — Telephone Encounter (Signed)
 The patient contacted the office and rescheduled for 3/24 with Dr Lonny Prude.

## 2024-01-21 ENCOUNTER — Ambulatory Visit: Payer: BC Managed Care – PPO | Admitting: Obstetrics

## 2024-01-31 ENCOUNTER — Other Ambulatory Visit: Payer: Self-pay | Admitting: Family Medicine

## 2024-01-31 DIAGNOSIS — E66812 Obesity, class 2: Secondary | ICD-10-CM

## 2024-02-01 ENCOUNTER — Other Ambulatory Visit (HOSPITAL_COMMUNITY): Payer: Self-pay

## 2024-02-04 ENCOUNTER — Telehealth: Payer: Self-pay

## 2024-02-04 ENCOUNTER — Ambulatory Visit: Payer: BC Managed Care – PPO | Admitting: Obstetrics

## 2024-02-04 NOTE — Telephone Encounter (Signed)
 Copied from CRM [id number]. Topic: Clinical - Medication Question >> Feb 04, 2024  9:34 AM Kelly Pittman wrote: Reason for CRM: Patient called in to refill prescription for her Semaglutide-Weight Management (WEGOVY) 0.5 MG/0.5ML SOAJ, advised patient of nurse message as stated and patient states no nausea, diarrhea or vomiting and would like to increase the dosage as well. Patient would like to increase to 1 or 1.7 states her weight has been the same for a couple of months. Wants prescription sent in to the pharmacy below, thanks.  [facility] [address] ([address]), [place] - [address] [place] ([place]) [place] [address] Phone: [phone] Fax: [phone]  [place] [phone]

## 2024-02-05 ENCOUNTER — Other Ambulatory Visit: Payer: Self-pay | Admitting: Family Medicine

## 2024-02-05 DIAGNOSIS — E66812 Obesity, class 2: Secondary | ICD-10-CM

## 2024-02-05 MED ORDER — SEMAGLUTIDE-WEIGHT MANAGEMENT 1 MG/0.5ML ~~LOC~~ SOAJ: 1.0000 mg | SUBCUTANEOUS | 1 refills | Status: DC

## 2024-02-06 ENCOUNTER — Other Ambulatory Visit: Payer: Self-pay

## 2024-02-06 MED ORDER — SEMAGLUTIDE-WEIGHT MANAGEMENT 1 MG/0.5ML ~~LOC~~ SOAJ: 1.0000 mg | SUBCUTANEOUS | 1 refills | Status: DC

## 2024-02-06 NOTE — Telephone Encounter (Signed)
 Re-sent to pharmacy [facility] [address]

## 2024-02-06 NOTE — Telephone Encounter (Signed)
 Called and canceled the rx that was to be filled in May.

## 2024-02-18 ENCOUNTER — Ambulatory Visit: Admitting: Podiatry

## 2024-02-18 ENCOUNTER — Encounter: Payer: Self-pay | Admitting: Podiatry

## 2024-02-18 DIAGNOSIS — D2371 Other benign neoplasm of skin of right lower limb, including hip: Secondary | ICD-10-CM

## 2024-02-18 NOTE — Progress Notes (Signed)
 Kelly Pittman presents today after having not seen her for couple of years.  She states that she has a painful area on the back of her heel where the surgery was performed.  Objective: Vital signs are stable she is alert oriented x 3 right heel does demonstrate at the very distalmost aspect of the old incision site a benign skin lesion that does not appear to be verrucoid in nature possibly subcutaneous nonabsorbable knot causing irritation.  Assessment painful benign skin lesion posterior aspect of the foot.  Plan: Recommended that she try silicone scar guard middermal I did express to her that we may have to actually take that out.

## 2024-02-24 ENCOUNTER — Other Ambulatory Visit: Payer: Self-pay | Admitting: Family Medicine

## 2024-02-24 DIAGNOSIS — J309 Allergic rhinitis, unspecified: Secondary | ICD-10-CM

## 2024-02-27 ENCOUNTER — Telehealth: Payer: Self-pay

## 2024-02-27 NOTE — Telephone Encounter (Signed)
 Copied from CRM [id number]. Topic: Appointments - Transfer of Care >> Feb 27, 2024 11:11 AM Bambi Bonine D wrote: Pt is requesting to transfer FROM: [facility], Dr.Tanya Mascoutah Pt is requesting to transfer TO: [facility], Tino Foreman Reason for requested transfer: PCP leaving office It is the responsibility of the team the patient would like to transfer to (Dr. Julietta Ogren) to reach out to the patient if for any reason this transfer is not acceptable.

## 2024-03-03 ENCOUNTER — Encounter: Payer: Self-pay | Admitting: Obstetrics

## 2024-03-03 ENCOUNTER — Ambulatory Visit (INDEPENDENT_AMBULATORY_CARE_PROVIDER_SITE_OTHER): Admitting: Obstetrics

## 2024-03-03 VITALS — BP 113/62 | HR 63 | Ht 64.0 in | Wt 186.0 lb

## 2024-03-03 DIAGNOSIS — Z13820 Encounter for screening for osteoporosis: Secondary | ICD-10-CM

## 2024-03-03 DIAGNOSIS — N952 Postmenopausal atrophic vaginitis: Secondary | ICD-10-CM

## 2024-03-03 DIAGNOSIS — Z01419 Encounter for gynecological examination (general) (routine) without abnormal findings: Secondary | ICD-10-CM

## 2024-03-03 NOTE — Progress Notes (Signed)
 GYNECOLOGY: ANNUAL EXAM   Subjective:    PCP: Valli Gaw, MD Jarrett Eeva Schlosser is a 58 y.o. female [phone] who presents for annual wellness visit. Pt previously saw Dr. Denman Fischer. No concerns today. Does have occasional hot flashes, well-controlled with Lexapro , managed by her PCP. Also takes OTC Estroven which helps.   Well Woman Visit:  GYN HISTORY:  No LMP recorded. Patient has had a hysterectomy.     Menstrual History: OB History     Gravida  3   Para  0   Term  0   Preterm  0   AB  3   Living  1      SAB  3   IAB  0   Ectopic  0   Multiple  0   Live Births           Obstetric Comments  Patient has 1 adopted daughter       Menarche age: 45 No LMP recorded. Patient has had a hysterectomy.   Urinary incontinence? yes  Sexually active: yes Number of sexual partners:  Gender of sexual Partners: male Social History   Substance and Sexual Activity  Sexual Activity Yes   Birth control/protection: Surgical  Contraceptive methods:  Dyspareunia? no STI history: no STI/HIV testing or immunizations needed? No.   Health Maintenance: -Last pap: was normal  --> Any abnormals: no -Last mammogram: 12/2023 --> Any abnormals? 20 years ago -Last colon cancer screen: 2023 / Type: colonoscopy -Last DEXA scan: utd -[facility] of Breast / Colon / Cervical cancer: no -Vaccines:  Immunization History  Administered Date(s) Administered   DT (Pediatric) 07/12/2005   Influenza Split 08/17/2010, 10/27/2015   Influenza,inj,Quad PF,6+ Mos 08/11/2019, 08/20/2022   Influenza-Unspecified 08/20/2020   MMR 07/12/2005   PFIZER(Purple Top)SARS-COV-2 Vaccination 02/01/2020, 02/22/2020, 10/08/2020   Tdap 03/30/2017   Zoster Recombinant(Shingrix ) 09/10/2019, 03/09/2020   Last Tdap: utd / Flu: utd / COVID: utd / Gardasil: age out / Shingles (50+): utd / PCV20: utd -Hep C screen: utd -Last lipid / glucose screening: utd  > Exercise: , very active > Dietary Supplements:  Folate: Yes;  Calcium: Yes}; Vitamin D : Yes > Body mass index is 31.93 kg/m.  > Recent dental visit Yes.   > Seat Belt Use: Yes.   > Texting and driving? No. > Guns in the house Yes.   > Recreational or other drug use: denied.   Social History   Tobacco Use   Smoking status: Former    Current packs/day: 0.00    Types: Cigarettes    Quit date: 2020    Years since quitting: 5.3   Smokeless tobacco: Never  Substance Use Topics   Alcohol use: Yes    Comment: socially  _________________________________________________________  Current Outpatient Medications  Medication Sig Dispense Refill   ALPRAZolam  (XANAX ) 0.25 MG tablet Take 1 tablet (0.25 mg total) by mouth at bedtime. 14 tablet 0   calcium carbonate (CALCIUM 600) 600 MG TABS tablet Take 600 mg by mouth daily.     escitalopram  (LEXAPRO ) 10 MG tablet TAKE 1/2 TABLET BY MOUTH DAILY 45 tablet 2   hydrochlorothiazide  (HYDRODIURIL ) 12.5 MG tablet Take 1 tablet (12.5 mg total) by mouth every morning. 90 tablet 3   ibuprofen  (ADVIL ) 800 MG tablet Take 800 mg by mouth every 8 (eight) hours as needed for moderate pain (pain score 4-6).     Insulin  Pen Needle (NOVOFINE) 30G X 8 MM MISC Inject 10 each into the skin as needed. 100  each 3   latanoprost (XALATAN) 0.005 % ophthalmic solution 1 drop at bedtime.     levocetirizine (XYZAL ) 5 MG tablet Take 1 tablet (5 mg total) by mouth every evening. 90 tablet 3   meloxicam  (MOBIC ) 7.5 MG tablet Take 1 tablet (7.5 mg total) by mouth daily. 20 tablet 0   mometasone  (NASONEX ) 50 MCG/ACT nasal spray PLACE 2 SPRAYS INTO THE NOSE DAILY. 51 each 3   Multiple Vitamin (MULTIVITAMIN) capsule Take by mouth daily.      Nutritional Supplements (ESTROVEN PO) Take by mouth.      pantoprazole  (PROTONIX ) 40 MG tablet Take 1 tablet (40 mg total) by mouth daily. 90 tablet 3   potassium chloride  SA (KLOR-CON  M) 20 MEQ tablet Take 1 tablet (20 mEq total) by mouth daily. 90 tablet 3   Semaglutide -Weight Management  1 MG/0.5ML SOAJ Inject 1 mg into the skin once a week. 2 mL 1   telmisartan  (MICARDIS ) 20 MG tablet Take 1 tablet (20 mg total) by mouth daily. 90 tablet 3   No current facility-administered medications for this visit.   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  08/2018: low titer ANA 1/80 nucleolar speckled  Normal RF/CCP   Post-nasal drip 02/18/2015   UTI (urinary tract infection)    Past Surgical History:  Procedure Laterality Date   ABDOMINAL HYSTERECTOMY     2009   BREAST BIOPSY Right 2003   CARPAL TUNNEL RELEASE     COLONOSCOPY WITH PROPOFOL  N/A 05/18/2020   Procedure: COLONOSCOPY WITH PROPOFOL ;  Surgeon: Luke Salaam, MD;  Location: [facility] ENDOSCOPY;  Service: Gastroenterology;  Laterality: N/A;   COLONOSCOPY WITH PROPOFOL  N/A 05/19/2020   Procedure: COLONOSCOPY WITH PROPOFOL ;  Surgeon: Luke Salaam, MD;  Location: [facility] ENDOSCOPY;  Service: Gastroenterology;  Laterality: N/A;   FOOT SURGERY     right   TONSILLECTOMY     1987   UNILATERAL SALPINGECTOMY     left    Review Of Systems  Constitutional: Denied constitutional symptoms, night sweats, recent illness, fatigue, fever, insomnia and weight loss.  Eyes: Denied eye symptoms, eye pain, photophobia, vision change and visual disturbance.   Ears/Nose/Throat/Neck: Denied ear, nose, throat or neck symptoms, hearing loss, nasal discharge, sinus congestion and sore throat.  Cardiovascular: Denied cardiovascular symptoms, arrhythmia, chest pain/pressure, edema, exercise intolerance, orthopnea and palpitations.  Respiratory: Denied pulmonary symptoms, asthma, pleuritic pain, productive sputum, cough, dyspnea and wheezing.  Gastrointestinal: Denied, gastro-esophageal reflux, melena, nausea and vomiting.  Genitourinary: Denied genitourinary symptoms including symptomatic vaginal discharge, pelvic relaxation issues, and urinary complaints.  Musculoskeletal: Denied musculoskeletal symptoms, stiffness, swelling, muscle weakness and myalgia.  Dermatologic: Denied dermatology symptoms, rash and scar.  Neurologic: Denied neurology symptoms, dizziness, headache, neck pain and syncope.  Psychiatric: Denied psychiatric  symptoms, anxiety and depression.  Endocrine: Denied endocrine symptoms including hot flashes and night sweats.      Objective:    BP 113/62   Pulse 63   Ht 5\' 4"  (1.626 m)   Wt 186 lb (84.4 kg)   BMI 31.93 kg/m   Constitutional: Well-developed, well-nourished female in no acute distress Neurological: Alert and oriented to person, place, and time Psychiatric: Mood and affect appropriate Skin: No rashes or lesions Neck: Supple without masses. Trachea is midline.Thyroid  is normal size without masses Lymphatics: No cervical, axillary, supraclavicular, or inguinal adenopathy noted Respiratory: Clear to auscultation bilaterally. Good air movement with normal work of breathing. Cardiovascular: Regular rate and rhythm. Extremities grossly normal, nontender with no edema; pulses regular Gastrointestinal: Soft, nontender, nondistended. No masses or hernias appreciated. No hepatosplenomegaly. No fluid wave. No rebound or guarding. Breast Exam: normal appearance, no masses or tenderness, Inspection negative, No nipple retraction or  dimpling, No nipple discharge or bleeding, No axillary or supraclavicular adenopathy, Normal to palpation without dominant masses Genitourinary:         External Genitalia: Normal female genitalia    Vagina: Normal mucosa, no lesions.    Cervix: Surgically absent    Uterus: Surgically absent Adnexae: Non-palpable and non-tender Perineum/Anus: No lesions Rectal: deferred    Assessment/Plan:    Eman Kristi Norment is a 58 y.o. female [phone] with normal well-woman gynecologic exam. Managing hot flashes with Lexapro , prescribed by her PCP, and OTC Estroven.   -Screenings:  Pap: s/p hysterectomy Mammogram: completed Feb 2025 Dexa: due Oct '25, ordered Colon: PCP Labs: PCP -Healthy lifestyle modifications discussed: multivitamin, diet, exercise, sunscreen, tobacco and alcohol use. Emphasized importance of regular physical activity.  -Calcium and Vit D recommendation reviewed.  -All questions answered to patient's satisfaction.  -RTC 1 yr for annual, sooner prn.    Sofia Dunn, DO [facility] at [facility]

## 2024-03-03 NOTE — Patient Instructions (Signed)
 Preventive Care 16-58 Years Old, Female  Preventive care refers to lifestyle choices and visits with your health care provider that can promote health and wellness. Preventive care visits are also called wellness exams.  What can I expect for my preventive care visit?  Counseling  Your health care provider may ask you questions about your:  Medical history, including:  Past medical problems.  Family medical history.  Pregnancy history.  Current health, including:  Menstrual cycle.  Method of birth control.  Emotional well-being.  Home life and relationship well-being.  Sexual activity and sexual health.  Lifestyle, including:  Alcohol, nicotine or tobacco, and drug use.  Access to firearms.  Diet, exercise, and sleep habits.  Work and work Astronomer.  Sunscreen use.  Safety issues such as seatbelt and bike helmet use.  Physical exam  Your health care provider will check your:  Height and weight. These may be used to calculate your BMI (body mass index). BMI is a measurement that tells if you are at a healthy weight.  Waist circumference. This measures the distance around your waistline. This measurement also tells if you are at a healthy weight and may help predict your risk of certain diseases, such as type 2 diabetes and high blood pressure.  Heart rate and blood pressure.  Body temperature.  Skin for abnormal spots.  What immunizations do I need?    Vaccines are usually given at various ages, according to a schedule. Your health care provider will recommend vaccines for you based on your age, medical history, and lifestyle or other factors, such as travel or where you work.  What tests do I need?  Screening  Your health care provider may recommend screening tests for certain conditions. This may include:  Lipid and cholesterol levels.  Diabetes screening. This is done by checking your blood sugar (glucose) after you have not eaten for a while (fasting).  Pelvic exam and Pap test.  Hepatitis B test.  Hepatitis C  test.  HIV (human immunodeficiency virus) test.  STI (sexually transmitted infection) testing, if you are at risk.  Lung cancer screening.  Colorectal cancer screening.  Mammogram. Talk with your health care provider about when you should start having regular mammograms. This may depend on whether you have a family history of breast cancer.  BRCA-related cancer screening. This may be done if you have a family history of breast, ovarian, tubal, or peritoneal cancers.  Bone density scan. This is done to screen for osteoporosis.  Talk with your health care provider about your test results, treatment options, and if necessary, the need for more tests.  Follow these instructions at home:  Eating and drinking    Eat a diet that includes fresh fruits and vegetables, whole grains, lean protein, and low-fat dairy products.  Take vitamin and mineral supplements as recommended by your health care provider.  Do not drink alcohol if:  Your health care provider tells you not to drink.  You are pregnant, may be pregnant, or are planning to become pregnant.  If you drink alcohol:  Limit how much you have to 0-1 drink a day.  Know how much alcohol is in your drink. In the [place] one drink equals one 12 oz bottle of beer (355 mL), one 5 oz glass of wine (148 mL), or one 1 oz glass of hard liquor (44 mL).  Lifestyle  Brush your teeth every morning and night with fluoride toothpaste. Floss one time each day.  Exercise for at least  30 minutes 5 or more days each week.  Do not use any products that contain nicotine or tobacco. These products include cigarettes, chewing tobacco, and vaping devices, such as e-cigarettes. If you need help quitting, ask your health care provider.  Do not use drugs.  If you are sexually active, practice safe sex. Use a condom or other form of protection to prevent STIs.  If you do not wish to become pregnant, use a form of birth control. If you plan to become pregnant, see your health care provider for a  prepregnancy visit.  Take aspirin only as told by your health care provider. Make sure that you understand how much to take and what form to take. Work with your health care provider to find out whether it is safe and beneficial for you to take aspirin daily.  Find healthy ways to manage stress, such as:  Meditation, yoga, or listening to music.  Journaling.  Talking to a trusted person.  Spending time with friends and family.  Minimize exposure to UV radiation to reduce your risk of skin cancer.  Safety  Always wear your seat belt while driving or riding in a vehicle.  Do not drive:  If you have been drinking alcohol. Do not ride with someone who has been drinking.  When you are tired or distracted.  While texting.  If you have been using any mind-altering substances or drugs.  Wear a helmet and other protective equipment during sports activities.  If you have firearms in your house, make sure you follow all gun safety procedures.  Seek help if you have been physically or sexually abused.  What's next?  Visit your health care provider once a year for an annual wellness visit.  Ask your health care provider how often you should have your eyes and teeth checked.  Stay up to date on all vaccines.  This information is not intended to replace advice given to you by your health care provider. Make sure you discuss any questions you have with your health care provider.  Document Revised: 04/27/2021 Document Reviewed: 04/27/2021  Elsevier Patient Education  2024 [employer].

## 2024-03-27 ENCOUNTER — Other Ambulatory Visit: Payer: Self-pay | Admitting: Family Medicine

## 2024-03-27 DIAGNOSIS — E66812 Obesity, class 2: Secondary | ICD-10-CM

## 2024-04-26 ENCOUNTER — Other Ambulatory Visit: Payer: Self-pay | Admitting: Family Medicine

## 2024-04-26 DIAGNOSIS — E66812 Obesity, class 2: Secondary | ICD-10-CM

## 2024-05-02 ENCOUNTER — Ambulatory Visit
Admission: RE | Admit: 2024-05-02 | Discharge: 2024-05-02 | Disposition: A | Discharge status: Home / Self Care | Source: Ambulatory Visit | Attending: Emergency Medicine | Admitting: Emergency Medicine

## 2024-05-02 ENCOUNTER — Ambulatory Visit: Payer: Self-pay

## 2024-05-02 VITALS — BP 125/78 | HR 62 | Temp 99.0°F | Resp 16

## 2024-05-02 DIAGNOSIS — J019 Acute sinusitis, unspecified: Secondary | ICD-10-CM

## 2024-05-02 DIAGNOSIS — B9789 Other viral agents as the cause of diseases classified elsewhere: Secondary | ICD-10-CM

## 2024-05-02 LAB — POCT RAPID STREP A (OFFICE): Rapid Strep A Screen: NEGATIVE

## 2024-05-02 MED ORDER — AMOXICILLIN-POT CLAVULANATE 875-125 MG PO TABS: 1.0000 | ORAL_TABLET | Freq: Two times a day (BID) | ORAL | 0 refills | Status: DC

## 2024-05-02 MED ORDER — MELOXICAM 10 MG PO CAPS: 10.0000 mg | ORAL_CAPSULE | Freq: Every day | ORAL | 0 refills | Status: DC | PRN

## 2024-05-02 MED ORDER — IPRATROPIUM BROMIDE 0.03 % NA SOLN: 2.0000 | Freq: Two times a day (BID) | NASAL | 0 refills | Status: DC

## 2024-05-02 NOTE — Telephone Encounter (Signed)
 FYI Only or Action Required?: FYI only for provider.  Patient was last seen in primary care on 11/01/2023 by Tona Francis, NP. Called Nurse Triage reporting Sore Throat., mild dizziness - resolved, ear pain Symptoms began several days ago. Interventions attempted: Other: went to UC. Symptoms are: gradually worsening.  Triage Disposition: See Physician Within 24 Hours  Patient/caregiver understands and will follow disposition?: Yes             Copied from CRM [id number]. Topic: Clinical - Red Word Triage >> May 02, 2024  9:27 AM Kita Perish H wrote: [facility] that prompted transfer to Nurse Triage: Sore throat, ear and jaw pain Reason for Disposition  SEVERE (e.g., excruciating) throat pain  Answer Assessment - Initial Assessment Questions 1. ONSET: When did the throat start hurting? (Hours or days ago)      Weds evening - ear pain started first - right side 2. SEVERITY: How bad is the sore throat? (Scale 1-10; mild, moderate or severe)   - MILD (1-3):  Doesn't interfere with eating or normal activities.   - MODERATE (4-7): Interferes with eating some solids and normal activities.   - SEVERE (8-10):  Excruciating pain, interferes with most normal activities.   - SEVERE WITH DYSPHAGIA (10): Can't swallow liquids, drooling.     severe 3. STREP EXPOSURE: Has there been any exposure to strep within the past week? If Yes, ask: What type of contact occurred?      no 4.  VIRAL SYMPTOMS: Are there any symptoms of a cold, such as a runny nose, cough, hoarse voice or red eyes?      No - yesterday runny nose 5. FEVER: Do you have a fever? If Yes, ask: What is your temperature, how was it measured, and when did it start?     no 6. PUS ON THE TONSILS: Is there pus on the tonsils in the back of your throat?     No tonsils 7. OTHER SYMPTOMS: Do you have any other symptoms? (e.g., difficulty breathing, headache, rash)     No - a little dizzy  Protocols used: Sore  Throat-A-AH

## 2024-05-02 NOTE — ED Triage Notes (Signed)
 Sore throat, right ear pain that goes down jaw x 2 days. Taking OTC pain relief ear drops and tylenol .

## 2024-05-02 NOTE — Telephone Encounter (Signed)
Pt went to UC today

## 2024-05-02 NOTE — Discharge Instructions (Signed)
 Today you are being treated for a sinus infection, based on research these are typically caused by a virus and should steadily improve in time it can take up to 7 to 10 days before you truly start to see a turnaround however things will get better, if no improvement seen by Wednesday may pick up antibiotic from the pharmacy  Rapid strep negative   In the meantime you may use meloxicam  daily to help reduce inflammation and sinus pressure, may take Tylenol  additionally as needed  You may use nasal spray to clear out the sinuses   For cough: honey 1/2 to 1 teaspoon (you can dilute the honey in water or another fluid).  You can also use guaifenesin and dextromethorphan for cough. You can use a humidifier for chest congestion and cough.  If you don't have a humidifier, you can sit in the bathroom with the hot shower running.      For sore throat: try warm salt water gargles, cepacol lozenges, throat spray, warm tea or water with lemon/honey, popsicles or ice, or OTC cold relief medicine for throat discomfort.   For congestion: take a daily anti-histamine like Zyrtec, Claritin, and a oral decongestant, such as pseudoephedrine.  You can also use Flonase  1-2 sprays in each nostril daily.   It is important to stay hydrated: drink plenty of fluids (water, gatorade/powerade/pedialyte, juices, or teas) to keep your throat moisturized and help further relieve irritation/discomfort.

## 2024-05-02 NOTE — ED Provider Notes (Signed)
 Arlander Bellman    CSN: [id number] Arrival date & time: 05/02/24  1327      History   Chief Complaint Chief Complaint  Patient presents with   Otalgia   Sore Throat    HPI Kelly Pittman is a 58 y.o. female.   Patient presents for evaluation of nasal congestion, sore throat, right sided ear pain radiating down the jaw, nonproductive cough and headaches present for 2 to 3 days.  Has attempted use of over-the-counter eardrops and Tylenol  which have been ineffective.  No known sick contacts prior.  Decreased appetite but tolerating food and liquids.  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  08/2018: low titer ANA 1/80 nucleolar speckled  Normal RF/CCP   Post-nasal drip 02/18/2015   UTI (urinary tract infection)     Patient Active Problem List   Diagnosis Date Noted   Foot swelling 11/12/2023   History of thrush 09/05/2023   Breast cancer screening by mammogram 09/05/2023   Pain in right shoulder 08/21/2023   Stiffness of right shoulder, not elsewhere classified 08/21/2023   Derangement of right shoulder joint 07/18/2023   Hypokalemia 07/09/2023   High serum vitamin B12 07/09/2023   Mood disorder (HCC) 06/09/2023   Encounter for screening for HIV 06/09/2023   Hyperglycemia 06/09/2023   High serum vitamin D  06/09/2023   Sinus congestion 03/06/2022   Sprain of wrist 01/18/2022   Cervical arthritis 04/04/2021   Thoracic arthritis 04/04/2021   Allergic rhinitis 02/11/2020   Raynaud's  phenomenon without gangrene 02/20/2019   Tingling in extremities 02/20/2019   Gastroesophageal reflux disease 02/20/2019   Annual physical exam 08/21/2018   HLD (hyperlipidemia) 08/21/2018   Thyroid  nodule 07/22/2018   Goiter 07/22/2018   Essential hypertension 02/18/2015   Seasonal allergies 02/18/2015   Spastic pelvic floor syndrome 06/05/2014   Obesity 03/27/2014    Past Surgical History:  Procedure Laterality Date   ABDOMINAL HYSTERECTOMY     2009   BREAST BIOPSY Right 2003   CARPAL TUNNEL RELEASE     COLONOSCOPY WITH PROPOFOL  N/A 05/18/2020   Procedure: COLONOSCOPY WITH PROPOFOL ;  Surgeon: Luke Salaam, MD;  Location: [facility] ENDOSCOPY;  Service: Gastroenterology;  Laterality: N/A;   COLONOSCOPY WITH PROPOFOL  N/A 05/19/2020   Procedure: COLONOSCOPY WITH PROPOFOL ;  Surgeon: Luke Salaam, MD;  Location: [facility] ENDOSCOPY;  Service: Gastroenterology;  Laterality: N/A;   FOOT SURGERY     right   TONSILLECTOMY     1987   UNILATERAL SALPINGECTOMY     left    OB History     Gravida  3   Para  0   Term  0   Preterm  0   AB  3   Living  1      SAB  3   IAB  0   Ectopic  0   Multiple  0   Live Births           Obstetric Comments  Patient has 1 adopted daughter          Home Medications    Prior to Admission medications   Medication Sig Start Date End Date Taking? Authorizing Provider  ALPRAZolam  (XANAX ) 0.25 MG tablet Take 1 tablet (0.25 mg total) by mouth at bedtime. 12/11/23  Yes Webb, Padonda B, FNP  amoxicillin -clavulanate (AUGMENTIN ) 875-125 MG tablet Take 1 tablet by mouth every 12 (twelve) hours. 05/07/24  Yes Cecilio Ohlrich R, NP  calcium carbonate (CALCIUM 600) 600 MG TABS tablet Take 600 mg by mouth daily.   Yes [provider]  escitalopram  (LEXAPRO ) 10 MG tablet TAKE 1/2 TABLET BY MOUTH DAILY 09/04/23  Yes Valli Gaw, MD  hydrochlorothiazide  (HYDRODIURIL ) 12.5 MG tablet Take 1 tablet (12.5 mg total) by mouth every morning. 09/04/23  Yes  Walsh, Tanya, MD  ipratropium (ATROVENT) 0.03 % nasal spray Place 2 sprays into both nostrils every 12 (twelve) hours. 05/02/24  Yes Greco Gastelum R, NP  latanoprost (XALATAN) 0.005 % ophthalmic solution 1 drop at bedtime. 11/14/21  Yes [provider]  levocetirizine (XYZAL ) 5 MG tablet Take 1 tablet (5 mg total) by mouth every evening. 05/03/23  Yes Valli Gaw, MD  Meloxicam  10 MG CAPS Take 10 mg by mouth daily as needed. 05/02/24  Yes Shawni Volkov, Maybelle Spatz, NP  Multiple Vitamin (MULTIVITAMIN) capsule Take by mouth daily.    Yes [provider]  Nutritional Supplements (ESTROVEN PO) Take by mouth.    Yes [provider]  pantoprazole  (PROTONIX ) 40 MG tablet Take 1 tablet (40 mg total) by mouth daily. 09/04/23  Yes Valli Gaw, MD  potassium chloride  SA (KLOR-CON  M) 20 MEQ tablet Take 1 tablet (20 mEq total) by mouth daily. 07/09/23  Yes Valli Gaw, MD  telmisartan  (MICARDIS ) 20 MG tablet Take 1 tablet (20 mg total) by mouth daily. 09/21/23  Yes Valli Gaw, MD  WEGOVY  1 MG/0.5ML SOAJ INJECT 1MG  SUBCUTANEOUSLY ONCE A WEEK 04/28/24  Yes Valli Gaw, MD  ibuprofen  (ADVIL ) 800 MG tablet Take 800 mg by mouth every 8 (eight) hours as needed for moderate pain (pain score 4-6).    [provider]  Insulin  Pen Needle (NOVOFINE) 30G X 8 MM MISC Inject 10 each into the skin as needed. 04/28/22   McLean-Scocuzza, Karon Packer, MD  mometasone  (NASONEX ) 50 MCG/ACT nasal spray PLACE 2 SPRAYS INTO THE NOSE DAILY. 02/25/24   Valli Gaw, MD    Family History Family History  Problem Relation Age of Onset   Hypertension Mother    Hyperlipidemia Mother    Hypertension Father    Cancer Cousin        bone cancer   Breast cancer Neg Hx    Ovarian cancer Neg Hx    Stroke Neg Hx    Thyroid  disease Neg Hx     Social History Social History   Tobacco Use   Smoking status: Former    Current packs/day: 0.00    Types: Cigarettes    Quit date: 2020    Years since quitting: 5.4    Smokeless tobacco: Never  Vaping Use   Vaping status: Never Used  Substance Use Topics   Alcohol use: Yes    Comment: socially   Drug use: No     Allergies   Losartan potassium-hctz and Tape   Review of Systems Review of Systems   Physical Exam Triage Vital Signs ED Triage Vitals  Encounter Vitals Group     BP 05/02/24 1343 125/78     Girls  Systolic BP Percentile --      Girls Diastolic BP Percentile --      Boys Systolic BP Percentile --      Boys Diastolic BP Percentile --      Pulse Rate 05/02/24 1343 62     Resp 05/02/24 1343 16     Temp 05/02/24 1343 99 F (37.2 C)     Temp Source 05/02/24 1343 Oral     SpO2 05/02/24 1343 100 %     Weight --      Height --      Head Circumference --      Peak Flow --      Pain Score 05/02/24 1345 7     Pain Loc --      Pain Education --      Exclude from Growth Chart --    No data found.  Updated Vital Signs BP 125/78 (BP Location: Left Arm)   Pulse 62   Temp 99 F (37.2 C) (Oral)   Resp 16   SpO2 100%   Visual Acuity Right Eye Distance:   Left Eye Distance:   Bilateral Distance:    Right Eye Near:   Left Eye Near:    Bilateral Near:     Physical Exam Constitutional:      Appearance: Normal appearance.  HENT:     Right Ear: A middle ear effusion is present.     Nose: Congestion present.     Mouth/Throat:     Pharynx: No oropharyngeal exudate or posterior oropharyngeal erythema.   Eyes:     Extraocular Movements: Extraocular movements intact.    Cardiovascular:     Rate and Rhythm: Normal rate and regular rhythm.     Pulses: Normal pulses.     Heart sounds: Normal heart sounds.  Pulmonary:     Effort: Pulmonary effort is normal.     Breath sounds: Normal breath sounds.   Musculoskeletal:     Cervical back: Normal range of motion and neck supple.   Neurological:     Mental Status: She is alert and oriented to person, place, and time. Mental status is at baseline.      UC Treatments /  Results  Labs (all labs ordered are listed, but only abnormal results are displayed) Labs Reviewed  POCT RAPID STREP A (OFFICE) - Normal    EKG   Radiology No results found.  Procedures Procedures (including critical care time)  Medications Ordered in UC Medications - No data to display  Initial Impression / Assessment and Plan / UC Course  I have reviewed the triage vital signs and the nursing notes.  Pertinent labs & imaging results that were available during my care of the patient were reviewed by me and considered in my medical decision making (see chart for details).  Acute viral sinusitis  Vital stable, patient no signs distress or toxic appearance presentation and symptomology consistent with a sinusitis, discussed, rapid strep test negative, no erythema or exudate present on exam, symptoms present for 2 days most likely viral in nature, prescribed meloxicam  and Atrovent nasal spray, declined prednisone as it causes heart heart to race, watch and wait antibiotic placed at pharmacy if no improvement seen, recommended supportive care and advised follow-up as needed Final Clinical Impressions(s) / UC Diagnoses   Final diagnoses:  Acute viral sinusitis     Discharge Instructions      Today you are being treated for a sinus infection, based on research these are typically  caused by a virus and should steadily improve in time it can take up to 7 to 10 days before you truly start to see a turnaround however things will get better, if no improvement seen by Wednesday may pick up antibiotic from the pharmacy  Rapid strep negative   In the meantime you may use meloxicam  daily to help reduce inflammation and sinus pressure, may take Tylenol  additionally as needed  You may use nasal spray to clear out the sinuses   For cough: honey 1/2 to 1 teaspoon (you can dilute the honey in water or another fluid).  You can also use guaifenesin and dextromethorphan for cough. You can use a  humidifier for chest congestion and cough.  If you don't have a humidifier, you can sit in the bathroom with the hot shower running.      For sore throat: try warm salt water gargles, cepacol lozenges, throat spray, warm tea or water with lemon/honey, popsicles or ice, or OTC cold relief medicine for throat discomfort.   For congestion: take a daily anti-histamine like Zyrtec, Claritin, and a oral decongestant, such as pseudoephedrine.  You can also use Flonase  1-2 sprays in each nostril daily.   It is important to stay hydrated: drink plenty of fluids (water, gatorade/powerade/pedialyte, juices, or teas) to keep your throat moisturized and help further relieve irritation/discomfort.    ED Prescriptions     Medication Sig Dispense Auth. Provider   ipratropium (ATROVENT) 0.03 % nasal spray Place 2 sprays into both nostrils every 12 (twelve) hours. 30 mL Franciso Dierks R, NP   Meloxicam  10 MG CAPS Take 10 mg by mouth daily as needed. 15 capsule Keenya Matera R, NP   amoxicillin -clavulanate (AUGMENTIN ) 875-125 MG tablet Take 1 tablet by mouth every 12 (twelve) hours. 14 tablet Amarien Carne R, NP      PDMP not reviewed this encounter.   Reena Canning, NP 05/02/24 1435

## 2024-05-21 ENCOUNTER — Ambulatory Visit
Admission: RE | Admit: 2024-05-21 | Discharge: 2024-05-21 | Disposition: A | Discharge status: Home / Self Care | Source: Ambulatory Visit | Attending: Emergency Medicine | Admitting: Emergency Medicine

## 2024-05-21 DIAGNOSIS — R350 Frequency of micturition: Secondary | ICD-10-CM | POA: Insufficient documentation

## 2024-05-21 DIAGNOSIS — N3000 Acute cystitis without hematuria: Secondary | ICD-10-CM | POA: Insufficient documentation

## 2024-05-21 LAB — POCT URINALYSIS DIP (MANUAL ENTRY)
Bilirubin, UA: NEGATIVE
Glucose, UA: NEGATIVE mg/dL
Ketones, POC UA: NEGATIVE mg/dL
Nitrite, UA: POSITIVE — AB
Protein Ur, POC: 30 mg/dL — AB
Spec Grav, UA: 1.02 (ref 1.010–1.025)
Urobilinogen, UA: 0.2 U/dL
pH, UA: 5.5 (ref 5.0–8.0)

## 2024-05-21 MED ORDER — FLUCONAZOLE 150 MG PO TABS: 150.0000 mg | ORAL_TABLET | ORAL | 0 refills | Status: DC | PRN

## 2024-05-21 MED ORDER — NITROFURANTOIN MONOHYD MACRO 100 MG PO CAPS: 100.0000 mg | ORAL_CAPSULE | Freq: Two times a day (BID) | ORAL | 0 refills | Status: DC

## 2024-05-21 NOTE — ED Triage Notes (Signed)
 Urinary frequency and odor x 4 days. Has not taken anything for it.

## 2024-05-21 NOTE — ED Provider Notes (Signed)
 Kelly Pittman    CSN: [id number] Arrival date & time: 05/21/24  1650      History   Chief Complaint Chief Complaint  Patient presents with   Urinary Frequency    Possible UTI - Entered by patient    HPI Kelly Pittman is a 58 y.o. female.   Patient presents for evaluation of urinary frequency, urinary odor and urgency present for 4 days.  Has not attempted treatment.  Denies vaginal symptoms, abdominal or flank pain, fever or vaginal symptoms.  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  08/2018: low titer ANA 1/80 nucleolar speckled  Normal RF/CCP   Post-nasal drip 02/18/2015   UTI (urinary tract infection)     Patient Active Problem List   Diagnosis Date Noted   Foot swelling 11/12/2023   History of thrush 09/05/2023   Breast cancer screening by mammogram 09/05/2023   Pain in right shoulder 08/21/2023   Stiffness of right shoulder, not elsewhere classified 08/21/2023   Derangement of right shoulder joint 07/18/2023   Hypokalemia 07/09/2023   High serum vitamin B12 07/09/2023   Mood disorder (HCC) 06/09/2023   Encounter for screening for HIV 06/09/2023   Hyperglycemia 06/09/2023   High serum vitamin D  06/09/2023   Sinus congestion 03/06/2022   Sprain of wrist 01/18/2022   Cervical arthritis 04/04/2021   Thoracic arthritis 04/04/2021   Allergic rhinitis 02/11/2020   Raynaud's phenomenon without gangrene 02/20/2019   Tingling in extremities 02/20/2019   Gastroesophageal reflux  disease 02/20/2019   Annual physical exam 08/21/2018   HLD (hyperlipidemia) 08/21/2018   Thyroid  nodule 07/22/2018   Goiter 07/22/2018   Essential hypertension 02/18/2015   Seasonal allergies 02/18/2015   Spastic pelvic floor syndrome 06/05/2014   Obesity 03/27/2014    Past Surgical History:  Procedure Laterality Date   ABDOMINAL HYSTERECTOMY     2009   BREAST BIOPSY Right 2003   CARPAL TUNNEL RELEASE     COLONOSCOPY WITH PROPOFOL  N/A 05/18/2020   Procedure: COLONOSCOPY WITH PROPOFOL ;  Surgeon: Therisa Bi, MD;  Location: [facility] ENDOSCOPY;  Service: Gastroenterology;  Laterality: N/A;   COLONOSCOPY WITH PROPOFOL  N/A 05/19/2020   Procedure: COLONOSCOPY WITH PROPOFOL ;  Surgeon: Therisa Bi, MD;  Location: [facility] ENDOSCOPY;  Service: Gastroenterology;  Laterality: N/A;   FOOT SURGERY     right   TONSILLECTOMY     1987   UNILATERAL SALPINGECTOMY     left    OB History     Gravida  3   Para  0   Term  0   Preterm  0   AB  3   Living  1      SAB  3   IAB  0   Ectopic  0   Multiple  0   Live Births           Obstetric Comments  Patient has 1 adopted daughter          Home Medications  Prior to Admission medications   Medication Sig Start Date End Date Taking? Authorizing Provider  fluconazole  (DIFLUCAN ) 150 MG tablet Take 1 tablet (150 mg total) by mouth every three (3) days as needed for up to 2 doses. 05/21/24  Yes Morelia Cassells R, NP  nitrofurantoin , macrocrystal-monohydrate, (MACROBID ) 100 MG capsule Take 1 capsule (100 mg total) by mouth 2 (two) times daily. 05/21/24  Yes Murphy Duzan R, NP  ALPRAZolam  (XANAX ) 0.25 MG tablet Take 1 tablet (0.25 mg total) by mouth at bedtime. 12/11/23   Webb, Padonda B, FNP  amoxicillin -clavulanate (AUGMENTIN ) 875-125 MG tablet Take 1 tablet by mouth every 12 (twelve) hours. 05/07/24   Teresa Shelba SAUNDERS, NP  calcium carbonate (CALCIUM 600) 600 MG TABS tablet Take 600 mg by mouth daily.    [provider]   escitalopram  (LEXAPRO ) 10 MG tablet TAKE 1/2 TABLET BY MOUTH DAILY 09/04/23   Hope Merle, MD  hydrochlorothiazide  (HYDRODIURIL ) 12.5 MG tablet Take 1 tablet (12.5 mg total) by mouth every morning. 09/04/23   Hope Merle, MD  ibuprofen  (ADVIL ) 800 MG tablet Take 800 mg by mouth every 8 (eight) hours as needed for moderate pain (pain score 4-6).    [provider]  Insulin  Pen Needle (NOVOFINE) 30G X 8 MM MISC Inject 10 each into the skin as needed. 04/28/22   McLean-Scocuzza, Randine SAILOR, MD  ipratropium (ATROVENT ) 0.03 % nasal spray Place 2 sprays into both nostrils every 12 (twelve) hours. 05/02/24   Finnegan Gatta, Shelba SAUNDERS, NP  latanoprost (XALATAN) 0.005 % ophthalmic solution 1 drop at bedtime. 11/14/21   [provider]  levocetirizine (XYZAL ) 5 MG tablet Take 1 tablet (5 mg total) by mouth every evening. 05/03/23   Hope Merle, MD  Meloxicam  10 MG CAPS Take 10 mg by mouth daily as needed. 05/02/24   Najla Aughenbaugh, Shelba SAUNDERS, NP  mometasone  (NASONEX ) 50 MCG/ACT nasal spray PLACE 2 SPRAYS INTO THE NOSE DAILY. 02/25/24   Hope Merle, MD  Multiple Vitamin (MULTIVITAMIN) capsule Take by mouth daily.     [provider]  Nutritional Supplements (ESTROVEN PO) Take by mouth.     [provider]  pantoprazole  (PROTONIX ) 40 MG tablet Take 1 tablet (40 mg total) by mouth daily. 09/04/23   Hope Merle, MD  potassium chloride  SA (KLOR-CON  M) 20 MEQ tablet Take 1 tablet (20 mEq total) by mouth daily. 07/09/23   Hope Merle, MD  telmisartan  (MICARDIS ) 20 MG tablet Take 1 tablet (20 mg total) by mouth daily. 09/21/23   Hope Merle, MD  WEGOVY  1 MG/0.5ML [facility] INJECT 1MG  SUBCUTANEOUSLY ONCE A WEEK 04/28/24   Hope Merle, MD    Family History Family History  Problem Relation Age of Onset   Hypertension Mother    Hyperlipidemia Mother    Hypertension Father    Cancer Cousin        bone cancer   Breast cancer Neg Hx    Ovarian cancer Neg Hx    Stroke Neg Hx    Thyroid  disease Neg Hx      Social History Social History   Tobacco Use   Smoking status: Former    Current packs/day: 0.00    Types: Cigarettes    Quit date: 2020    Years since quitting: 5.5   Smokeless tobacco: Never  Vaping Use   Vaping status: Never Used  Substance Use Topics   Alcohol use: Yes    Comment: socially   Drug use: No     Allergies  Losartan potassium-hctz and Tape   Review of Systems Review of Systems   Physical Exam Triage Vital Signs ED Triage Vitals [05/21/24 1700]  Encounter Vitals Group     BP      Girls Systolic BP Percentile      Girls Diastolic BP Percentile      Boys Systolic BP Percentile      Boys Diastolic BP Percentile      Pulse      Resp      Temp      Temp src      SpO2      Weight      Height      Head Circumference      Peak Flow      Pain Score 0     Pain Loc      Pain Education      Exclude from Growth Chart    No data found.  Updated Vital Signs There were no vitals taken for this visit.  Visual Acuity Right Eye Distance:   Left Eye Distance:   Bilateral Distance:    Right Eye Near:   Left Eye Near:    Bilateral Near:     Physical Exam Constitutional:      Appearance: Normal appearance.  Eyes:     Extraocular Movements: Extraocular movements intact.  Pulmonary:     Effort: Pulmonary effort is normal.  Abdominal:     Tenderness: There is no abdominal tenderness. There is no right CVA tenderness, left CVA tenderness or guarding.  Neurological:     Mental Status: She is alert and oriented to person, place, and time.      UC Treatments / Results  Labs (all labs ordered are listed, but only abnormal results are displayed) Labs Reviewed  POCT URINALYSIS DIP (MANUAL ENTRY) - Abnormal; Notable for the following components:      Result Value   Blood, UA moderate (*)    Protein Ur, POC =30 (*)    Nitrite, UA Positive (*)    Leukocytes, UA Small (1+) (*)    All other components within normal limits  URINE CULTURE     EKG   Radiology No results found.  Procedures Procedures (including critical care time)  Medications Ordered in UC Medications - No data to display  Initial Impression / Assessment and Plan / UC Course  I have reviewed the triage vital signs and the nursing notes.  Pertinent labs & imaging results that were available during my care of the patient were reviewed by me and considered in my medical decision making (see chart for details).  Acute cystitis without hematuria, urinary frequency  Urinalysis showing Ari Engelbrecht blood cells and nitrates, sent for culture, discussed with patient, prescribed Macrobid  and recommended supportive care with follow-up as needed Final Clinical Impressions(s) / UC Diagnoses   Final diagnoses:  Urinary frequency  Acute cystitis without hematuria     Discharge Instructions      Your urinalysis shows Kasen Adduci blood cells and nitrates which are indicative of infection, your urine will be sent to the lab to determine exactly which bacteria is present, if any changes need to be made to your medications you will be notified  Begin use of Macrobid  twice daily for 5 days  You may use over-the-counter Azo to help minimize your symptoms until antibiotic removes bacteria, this medication will turn your urine orange  Increase your fluid intake through use of water  As always practice good hygiene, wiping front  to back and avoidance of scented vaginal products to prevent further irritation  If symptoms continue to persist after use of medication or recur please follow-up with urgent care or your primary doctor as needed    ED Prescriptions     Medication Sig Dispense Auth. Provider   nitrofurantoin , macrocrystal-monohydrate, (MACROBID ) 100 MG capsule Take 1 capsule (100 mg total) by mouth 2 (two) times daily. 10 capsule Jennette Leask R, NP   fluconazole  (DIFLUCAN ) 150 MG tablet Take 1 tablet (150 mg total) by mouth every three (3) days as needed for up  to 2 doses. 2 tablet Myesha Stillion R, NP      PDMP not reviewed this encounter.   Teresa Shelba SAUNDERS, NP 05/21/24 1714

## 2024-05-21 NOTE — Discharge Instructions (Addendum)
Your urinalysis shows Kelly Pittman blood cells and nitrates which are indicative of infection, your urine will be sent to the lab to determine exactly which bacteria is present, if any changes need to be made to your medications you will be notified  Begin use of Macrobid twice daily for 5 days   You may use over-the-counter Azo to help minimize your symptoms until antibiotic removes bacteria, this medication will turn your urine orange  Increase your fluid intake through use of water  As always practice good hygiene, wiping front to back and avoidance of scented vaginal products to prevent further irritation  If symptoms continue to persist after use of medication or recur please follow-up with urgent care or your primary doctor as needed  

## 2024-05-23 ENCOUNTER — Ambulatory Visit (HOSPITAL_COMMUNITY): Payer: Self-pay

## 2024-05-23 LAB — URINE CULTURE: Culture: 80000 — AB

## 2024-05-27 ENCOUNTER — Other Ambulatory Visit: Payer: Self-pay | Admitting: Family Medicine

## 2024-05-30 ENCOUNTER — Other Ambulatory Visit: Payer: Self-pay

## 2024-05-30 ENCOUNTER — Telehealth: Payer: Self-pay

## 2024-05-30 ENCOUNTER — Other Ambulatory Visit: Payer: Self-pay | Admitting: Nurse Practitioner

## 2024-05-30 DIAGNOSIS — E6609 Other obesity due to excess calories: Secondary | ICD-10-CM

## 2024-05-30 MED ORDER — WEGOVY 1.7 MG/0.75ML ~~LOC~~ SOAJ: 1.7000 mg | SUBCUTANEOUS | 2 refills | Status: DC

## 2024-05-30 NOTE — Telephone Encounter (Signed)
 Copied from CRM [id number]. Topic: Clinical - Medication Question >> May 29, 2024  1:27 PM Burnard DEL wrote: Reason for CRM: Patient would like to know if she could have her wegovy  increased to the 1.7 MG now. The 1MG  was sent into pharmacy on 05/27/2024,but she would like to have the increased dosage.  [facility] [address] ([address]), [place] - [address]  Phone: [phone] Fax: [phone]

## 2024-05-30 NOTE — Telephone Encounter (Signed)
 Called and informed pt that medication has been sent in for her.

## 2024-05-30 NOTE — Telephone Encounter (Signed)
 Called pt and she stated that she never picked up the 1 mg og wegovy  because she was wanting the 1.7. She stated that she is due for her shot today, but has some of the 1mg  from before.

## 2024-06-04 ENCOUNTER — Encounter: Payer: Self-pay | Admitting: Emergency Medicine

## 2024-06-04 ENCOUNTER — Ambulatory Visit
Admission: EM | Admit: 2024-06-04 | Discharge: 2024-06-04 | Disposition: A | Discharge status: Home / Self Care | Attending: Emergency Medicine | Admitting: Emergency Medicine

## 2024-06-04 DIAGNOSIS — M76899 Other specified enthesopathies of unspecified lower limb, excluding foot: Secondary | ICD-10-CM

## 2024-06-04 DIAGNOSIS — H9202 Otalgia, left ear: Secondary | ICD-10-CM

## 2024-06-04 MED ORDER — OFLOXACIN 0.3 % OT SOLN: 10.0000 [drp] | Freq: Every day | OTIC | 0 refills | Status: AC

## 2024-06-04 NOTE — ED Triage Notes (Signed)
 Patient complains of left ear pain that is also itching x 3 days. Patient reports that she put olive oil in ear and also used over the counter ear drops. Rates pain 3/10.

## 2024-06-04 NOTE — Discharge Instructions (Signed)
 Today you were evaluated for ear - On exam there is no signs of infection on exam - As you are symptomatic you empirically being placed on eardrops, placed 10 drops of ofloxacin  into the left ear every morning for 7 days - May continue use of over-the-counter medicine if you deem them helpful - Would recommend that you take an over-the-counter cold and flu medicine to help reduce congestion as symptoms may be sinus related - May take Tylenol  and or Motrin  as needed for pain - May hold warm compresses to the outer ear for general comfort -If your symptoms continue to persist please follow-up with your primary doctor   for your leg - Tenderness that you are experiences is most likely related to inflammation and irritation to your tendon -May begin use of ibuprofen  or similar medication, take consistent for most effective results - May hold warm compresses over the affected area 10 to 15-minute intervals May elevate on pillows whenever sitting and lying to cushion the body and support the leg - Inside your packet is a list of exercises and stretches you may do to help provide comfort - If your symptoms continue to persist may follow-up with orthopedic doctor

## 2024-06-04 NOTE — ED Provider Notes (Addendum)
 Kelly Pittman    CSN: [id number] Arrival date & time: 06/04/24  1724      History   Chief Complaint Chief Complaint  Patient presents with   Otalgia    HPI Kelly Pittman is a 58 y.o. female.   Patient presents for evaluation of left-sided ear pain and pruritus beginning 3 days ago.  Attempted use of olive oil and over-the-counter eardrops which did improve symptoms but as the day progresses symptoms return or worsen.  Denies decreased hearing, fever, congestion or ear drainage.  Patient concerned with evaluation of right thigh pain present for 4 days.  Pain only experienced when the area is touched and the thigh feels cold.  Denies injury or trauma, numbness or tingling.  Pain does not radiate.  Has not attempted treatment.  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  08/2018: low titer ANA 1/80 nucleolar speckled  Normal RF/CCP   Post-nasal drip 02/18/2015   UTI (urinary tract infection)     Patient Active Problem List   Diagnosis Date Noted   Foot swelling 11/12/2023   History of thrush 09/05/2023   Breast cancer screening by mammogram 09/05/2023   Pain in right shoulder 08/21/2023   Stiffness of right shoulder, not elsewhere classified 08/21/2023   Derangement of right shoulder joint 07/18/2023   Hypokalemia 07/09/2023   High serum vitamin B12 07/09/2023   Mood disorder (HCC) 06/09/2023   Encounter for screening for HIV 06/09/2023   Hyperglycemia 06/09/2023   High serum vitamin  D 06/09/2023   Sinus congestion 03/06/2022   Sprain of wrist 01/18/2022   Cervical arthritis 04/04/2021   Thoracic arthritis 04/04/2021   Allergic rhinitis 02/11/2020   Raynaud's phenomenon without gangrene 02/20/2019   Tingling in extremities 02/20/2019   Gastroesophageal reflux disease 02/20/2019   Annual physical exam 08/21/2018   HLD (hyperlipidemia) 08/21/2018   Thyroid  nodule 07/22/2018   Goiter 07/22/2018   Essential hypertension 02/18/2015   Seasonal allergies 02/18/2015   Spastic pelvic floor syndrome 06/05/2014   Obesity 03/27/2014    Past Surgical History:  Procedure Laterality Date   ABDOMINAL HYSTERECTOMY     2009   BREAST BIOPSY Right 2003   CARPAL TUNNEL RELEASE     COLONOSCOPY WITH PROPOFOL  N/A 05/18/2020   Procedure: COLONOSCOPY WITH PROPOFOL ;  Surgeon: Therisa Bi, MD;  Location: [facility] ENDOSCOPY;  Service: Gastroenterology;  Laterality: N/A;   COLONOSCOPY WITH PROPOFOL  N/A 05/19/2020   Procedure: COLONOSCOPY WITH PROPOFOL ;  Surgeon: Therisa Bi, MD;  Location: [facility] ENDOSCOPY;  Service: Gastroenterology;  Laterality: N/A;   FOOT SURGERY     right   TONSILLECTOMY     1987   UNILATERAL SALPINGECTOMY     left    OB History     Gravida  3   Para  0   Term  0   Preterm  0   AB  3   Living  1      SAB  3  IAB  0   Ectopic  0   Multiple  0   Live Births           Obstetric Comments  Patient has 1 adopted daughter          Home Medications    Prior to Admission medications   Medication Sig Start Date End Date Taking? Authorizing Provider  ofloxacin  (FLOXIN ) 0.3 % OTIC solution Place 10 drops into the left ear daily. 06/04/24  Yes Takasha Vetere R, NP  ALPRAZolam  (XANAX ) 0.25 MG tablet Take 1 tablet (0.25 mg total) by mouth at bedtime. 12/11/23   Webb, Padonda B, FNP  amoxicillin -clavulanate (AUGMENTIN ) 875-125 MG tablet Take 1 tablet by mouth every 12 (twelve) hours. 05/07/24   Teresa Shelba SAUNDERS, NP  calcium carbonate (CALCIUM 600)  600 MG TABS tablet Take 600 mg by mouth daily.    [provider]  escitalopram  (LEXAPRO ) 10 MG tablet TAKE 1/2 TABLET BY MOUTH DAILY 09/04/23   Hope Merle, MD  fluconazole  (DIFLUCAN ) 150 MG tablet Take 1 tablet (150 mg total) by mouth every three (3) days as needed for up to 2 doses. 05/21/24   Cray Monnin, Shelba SAUNDERS, NP  hydrochlorothiazide  (HYDRODIURIL ) 12.5 MG tablet Take 1 tablet (12.5 mg total) by mouth every morning. 09/04/23   Hope Merle, MD  ibuprofen  (ADVIL ) 800 MG tablet Take 800 mg by mouth every 8 (eight) hours as needed for moderate pain (pain score 4-6).    [provider]  Insulin  Pen Needle (NOVOFINE) 30G X 8 MM MISC Inject 10 each into the skin as needed. 04/28/22   McLean-Scocuzza, Randine SAILOR, MD  ipratropium (ATROVENT ) 0.03 % nasal spray Place 2 sprays into both nostrils every 12 (twelve) hours. 05/02/24   Gurinder Toral, Shelba SAUNDERS, NP  latanoprost (XALATAN) 0.005 % ophthalmic solution 1 drop at bedtime. 11/14/21   [provider]  levocetirizine (XYZAL ) 5 MG tablet Take 1 tablet (5 mg total) by mouth every evening. 05/03/23   Hope Merle, MD  Meloxicam  10 MG CAPS Take 10 mg by mouth daily as needed. 05/02/24   Orvis Stann, Shelba SAUNDERS, NP  mometasone  (NASONEX ) 50 MCG/ACT nasal spray PLACE 2 SPRAYS INTO THE NOSE DAILY. 02/25/24   Hope Merle, MD  Multiple Vitamin (MULTIVITAMIN) capsule Take by mouth daily.     [provider]  nitrofurantoin , macrocrystal-monohydrate, (MACROBID ) 100 MG capsule Take 1 capsule (100 mg total) by mouth 2 (two) times daily. 05/21/24   Teresa Shelba SAUNDERS, NP  Nutritional Supplements (ESTROVEN PO) Take by mouth.     [provider]  pantoprazole  (PROTONIX ) 40 MG tablet Take 1 tablet (40 mg total) by mouth daily. 09/04/23   Hope Merle, MD  potassium chloride  SA (KLOR-CON  M) 20 MEQ tablet Take 1 tablet (20 mEq total) by mouth daily. 07/09/23   Hope Merle, MD  Semaglutide -Weight Management (WEGOVY ) 1.7 MG/0.75ML SOAJ Inject 1.7 mg into  the skin once a week. 05/30/24   Gretel App, NP  telmisartan  (MICARDIS ) 20 MG tablet Take 1 tablet (20 mg total) by mouth daily. 09/21/23   Hope Merle, MD    Family History Family History  Problem Relation Age of Onset   Hypertension Mother    Hyperlipidemia Mother    Hypertension Father    Cancer Cousin        bone cancer   Breast cancer Neg Hx    Ovarian cancer Neg Hx    Stroke Neg Hx    Thyroid  disease Neg Hx     Social  History Social History   Tobacco Use   Smoking status: Former    Current packs/day: 0.00    Types: Cigarettes    Quit date: 2020    Years since quitting: 5.5   Smokeless tobacco: Never  Vaping Use   Vaping status: Never Used  Substance Use Topics   Alcohol use: Yes    Comment: socially   Drug use: No     Allergies   Losartan potassium-hctz and Tape   Review of Systems Review of Systems   Physical Exam Triage Vital Signs ED Triage Vitals  Encounter Vitals Group     BP 06/04/24 1735 107/62     Girls Systolic BP Percentile --      Girls Diastolic BP Percentile --      Boys Systolic BP Percentile --      Boys Diastolic BP Percentile --      Pulse Rate 06/04/24 1735 62     Resp 06/04/24 1735 18     Temp 06/04/24 1735 99.1 F (37.3 C)     Temp Source 06/04/24 1735 Oral     SpO2 06/04/24 1735 98 %     Weight --      Height --      Head Circumference --      Peak Flow --      Pain Score 06/04/24 1739 3     Pain Loc --      Pain Education --      Exclude from Growth Chart --    No data found.  Updated Vital Signs BP 107/62 (BP Location: Left Arm)   Pulse 62   Temp 99.1 F (37.3 C) (Oral)   Resp 18   SpO2 98%   Visual Acuity Right Eye Distance:   Left Eye Distance:   Bilateral Distance:    Right Eye Near:   Left Eye Near:    Bilateral Near:     Physical Exam Constitutional:      Appearance: Normal appearance.  HENT:     Right Ear: Tympanic membrane, ear canal and external ear normal.     Left Ear: Tympanic  membrane, ear canal and external ear normal.  Eyes:     Extraocular Movements: Extraocular movements intact.  Pulmonary:     Effort: Pulmonary effort is normal.  Musculoskeletal:       Legs:     Comments: Tenderness present to the femoral tendon right above the left patella without ecchymosis swelling or deformity, able to bear weight and able to complete full range of motion, 2 popliteal pulse  Neurological:     Mental Status: She is alert and oriented to person, place, and time. Mental status is at baseline.      UC Treatments / Results  Labs (all labs ordered are listed, but only abnormal results are displayed) Labs Reviewed - No data to display  EKG   Radiology No results found.  Procedures Procedures (including critical care time)  Medications Ordered in UC Medications - No data to display  Initial Impression / Assessment and Plan / UC Course  I have reviewed the triage vital signs and the nursing notes.  Pertinent labs & imaging results that were available during my care of the patient were reviewed by me and considered in my medical decision making (see chart for details).  Otalgia of the left ear, quadriceps tendinitis  No abnormality indicating infection to the left ear, discussed this with patient empirically placed on ofloxacin  as she is symptomatic,  possibly related to the sinuses but she denies sinus congestion however recent sinus infection 1 month ago, recommended taking medication for nasal congestion, advised nonpharmacological management and advised follow-up with primary doctor if symptoms persist  Thigh pain appears to be irritation to the tendon, discussed with patient, declined steroids advised NSAIDs and RICE given written handout of exercises and stretches, may follow-up with orthopedics if symptoms persist Final Clinical Impressions(s) / UC Diagnoses   Final diagnoses:  Otalgia, left ear  Quadriceps tendonitis     Discharge Instructions       Today you were evaluated for ear - On exam there is no signs of infection on exam - As you are symptomatic you empirically being placed on eardrops, placed 10 drops of ofloxacin  into the left ear every morning for 7 days - May continue use of over-the-counter medicine if you deem them helpful - Would recommend that you take an over-the-counter cold and flu medicine to help reduce congestion as symptoms may be sinus related - May take Tylenol  and or Motrin  as needed for pain - May hold warm compresses to the outer ear for general comfort -If your symptoms continue to persist please follow-up with your primary doctor   for your leg - Tenderness that you are experiences is most likely related to inflammation and irritation to your tendon -May begin use of ibuprofen  or similar medication, take consistent for most effective results - May hold warm compresses over the affected area 10 to 15-minute intervals May elevate on pillows whenever sitting and lying to cushion the body and support the leg - Inside your packet is a list of exercises and stretches you may do to help provide comfort - If your symptoms continue to persist may follow-up with orthopedic doctor   ED Prescriptions     Medication Sig Dispense Auth. Provider   ofloxacin  (FLOXIN ) 0.3 % OTIC solution Place 10 drops into the left ear daily. 5 mL Teresa Shelba SAUNDERS, NP      PDMP not reviewed this encounter.   Teresa Shelba SAUNDERS, NP 06/04/24 1830    Teresa Shelba SAUNDERS, NP 06/04/24 [phone]

## 2024-06-12 ENCOUNTER — Encounter: Payer: Self-pay | Admitting: Nurse Practitioner

## 2024-06-12 ENCOUNTER — Ambulatory Visit: Admitting: Nurse Practitioner

## 2024-06-12 VITALS — BP 118/86 | HR 58 | Temp 98.1°F | Ht 64.0 in | Wt 182.6 lb

## 2024-06-12 DIAGNOSIS — Z6831 Body mass index (BMI) 31.0-31.9, adult: Secondary | ICD-10-CM | POA: Diagnosis not present

## 2024-06-12 DIAGNOSIS — R3915 Urgency of urination: Secondary | ICD-10-CM

## 2024-06-12 DIAGNOSIS — E785 Hyperlipidemia, unspecified: Secondary | ICD-10-CM | POA: Diagnosis not present

## 2024-06-12 DIAGNOSIS — F39 Unspecified mood [affective] disorder: Secondary | ICD-10-CM | POA: Diagnosis not present

## 2024-06-12 DIAGNOSIS — E049 Nontoxic goiter, unspecified: Secondary | ICD-10-CM | POA: Diagnosis not present

## 2024-06-12 DIAGNOSIS — R7989 Other specified abnormal findings of blood chemistry: Secondary | ICD-10-CM

## 2024-06-12 DIAGNOSIS — E876 Hypokalemia: Secondary | ICD-10-CM | POA: Diagnosis not present

## 2024-06-12 DIAGNOSIS — E66811 Obesity, class 1: Secondary | ICD-10-CM | POA: Diagnosis not present

## 2024-06-12 DIAGNOSIS — E6609 Other obesity due to excess calories: Secondary | ICD-10-CM | POA: Diagnosis not present

## 2024-06-12 DIAGNOSIS — I10 Essential (primary) hypertension: Secondary | ICD-10-CM

## 2024-06-12 LAB — POC URINALSYSI DIPSTICK (AUTOMATED)
Bilirubin, UA: NEGATIVE
Blood, UA: NEGATIVE
Glucose, UA: NEGATIVE
Ketones, UA: NEGATIVE
Leukocytes, UA: NEGATIVE
Nitrite, UA: NEGATIVE
Protein, UA: NEGATIVE
Spec Grav, UA: 1.025 (ref 1.010–1.025)
Urobilinogen, UA: 0.2 U/dL
pH, UA: 5.5 (ref 5.0–8.0)

## 2024-06-12 MED ORDER — TELMISARTAN 20 MG PO TABS: 20.0000 mg | ORAL_TABLET | Freq: Every day | ORAL | 3 refills | Status: DC

## 2024-06-12 MED ORDER — HYDROCHLOROTHIAZIDE 12.5 MG PO TABS
12.5000 mg | ORAL_TABLET | Freq: Every morning | ORAL | 3 refills | Status: AC
Start: 2024-06-12 — End: ?

## 2024-06-12 MED ORDER — POTASSIUM CHLORIDE CRYS ER 20 MEQ PO TBCR: 20.0000 meq | EXTENDED_RELEASE_TABLET | Freq: Every day | ORAL | 3 refills | Status: AC

## 2024-06-12 MED ORDER — ALPRAZOLAM 0.25 MG PO TABS: 0.2500 mg | ORAL_TABLET | Freq: Every day | ORAL | 0 refills | Status: DC

## 2024-06-12 MED ORDER — ESCITALOPRAM OXALATE 10 MG PO TABS
5.0000 mg | ORAL_TABLET | Freq: Every day | ORAL | 3 refills | Status: AC
Start: 2024-06-12 — End: ?

## 2024-06-12 NOTE — Progress Notes (Signed)
 Leron Glance, NP-C Phone: [phone]  Kelly Pittman is a 58 y.o. female who presents today for transfer of care.   Discussed the use of AI scribe software for clinical note transcription with the patient, who gave verbal consent to proceed.  History of Present Illness   The patient presents for transfer of care with urinary urgency and back pain.  They experience difficulty holding urine, describing it as urinary urgency, which has been ongoing and bothersome. No burning sensation during urination, visible blood in the urine, fever, or abdominal pain. They had a urinary tract infection about a month ago, after which the pressure and related symptoms resolved.  They report back pain, specifically in the area of their back, and express concern about a possible kidney infection. They request a urine test to check for any issues related to their kidneys.  They are on hydrochlorothiazide , which contributes to frequent urination, and telmisartan  for blood pressure management. They do not regularly check their blood pressure at home.  Regarding weight management, they have been on Wegovy  for less than a year and have lost about 25 pounds. Their current diet includes salads for lunch and light snacks for dinner, and they exercise more on weekends than during the week.  They are also on Lexapro , taking half a pill daily, which helps manage their mood and hot flashes. They occasionally use Xanax  as needed for stress related to their job with [employer], typically taking half a pill very rarely.  No chest pain, shortness of breath, dizziness, or swelling.   Social History   Tobacco Use  Smoking Status Former   Current packs/day: 0.00   Types: Cigarettes   Quit date: 2020   Years since quitting: 5.6  Smokeless Tobacco Never    Current Outpatient Medications on File Prior to Visit  Medication Sig Dispense Refill   calcium carbonate (CALCIUM 600) 600 MG TABS tablet Take  600 mg by mouth daily.     latanoprost (XALATAN) 0.005 % ophthalmic solution 1 drop at bedtime.     levocetirizine (XYZAL ) 5 MG tablet Take 1 tablet (5 mg total) by mouth every evening. 90 tablet 3   mometasone  (NASONEX ) 50 MCG/ACT nasal spray PLACE 2 SPRAYS INTO THE NOSE DAILY. 51 each 3   Multiple Vitamin (MULTIVITAMIN) capsule Take by mouth daily.      Nutritional Supplements (ESTROVEN PO) Take by mouth.      ofloxacin  (FLOXIN ) 0.3 % OTIC solution Place 10 drops into the left ear daily. 5 mL 0   pantoprazole  (PROTONIX ) 40 MG tablet Take 1 tablet (40 mg total) by mouth daily. 90 tablet 3   Semaglutide -Weight Management (WEGOVY ) 1.7 MG/0.75ML SOAJ Inject 1.7 mg into the skin once a week. 3 mL 2   No current facility-administered medications on file prior to visit.     ROS see history of present illness  Objective  Physical Exam Vitals:   06/12/24 1404  BP: 118/86  Pulse: (!) 58  Temp: 98.1 F (36.7 C)  SpO2: 99%    BP Readings from Last 3 Encounters:  06/12/24 118/86  06/04/24 107/62  05/02/24 125/78   Wt Readings from Last 3 Encounters:  06/12/24 182 lb 9.6 oz (82.8 kg)  03/03/24 186 lb (84.4 kg)  11/01/23 187 lb 6.4 oz (85 kg)    Physical Exam Constitutional:      General: She is not in acute distress.    Appearance: Normal appearance.  HENT:     Head: Normocephalic.  Cardiovascular:     Rate and Rhythm: Normal rate and regular rhythm.     Heart sounds: Normal heart sounds.  Pulmonary:     Effort: Pulmonary effort is normal.     Breath sounds: Normal breath sounds.  Skin:    General: Skin is warm and dry.  Neurological:     General: No focal deficit present.     Mental Status: She is alert.  Psychiatric:        Mood and Affect: Mood normal.        Behavior: Behavior normal.      Assessment/Plan: Please see individual problem list.  Urinary urgency Assessment & Plan: They experience urgency without frequency or dysuria, with a recent UTI now  resolved. The differential includes UTI or overactive bladder. Order a urinalysis to check for infection or abnormalities. If the urinalysis is clear, consider medication for overactive bladder and discuss potential side effects, such as dry mouth.   Orders: -     Urinalysis, Routine w reflex microscopic -     POCT Urinalysis Dipstick (Automated)  Essential hypertension Assessment & Plan: Blood pressure is well controlled with hydrochlorothiazide  12.5 mg daily and telmisartan  20 mg daily. Continue the current antihypertensive regimen and monitor blood pressure at home if possible. Refills provided.   Orders: -     CBC with Differential/Platelet -     hydroCHLOROthiazide ; Take 1 tablet (12.5 mg total) by mouth every morning.  Dispense: 90 tablet; Refill: 3 -     Telmisartan ; Take 1 tablet (20 mg total) by mouth daily.  Dispense: 90 tablet; Refill: 3  Class 1 obesity due to excess calories with body mass index (BMI) of 31.0 to 31.9 in adult, unspecified whether serious comorbidity present Assessment & Plan: They are managed with Wegovy , resulting in approximately 25-pound weight loss. Discuss long-term use for weight maintenance and benefits for kidney and heart health. Continue Wegovy  at 1.7 mg weekly. Encourage adequate protein intake and regular exercise. Discuss potential to adjust Wegovy  dosing based on weight maintenance goals. Check A1c today.   Orders: -     Hemoglobin A1c  Mood disorder (HCC) Assessment & Plan: Mood is well-managed on Lexapro  5 mg daily. An attempted discontinuation led to exacerbation of hot flashes. Continue Lexapro  at the current dose of half a pill daily. Anxiety is managed with as-needed alprazolam , used infrequently. They report increased work stress. Refill alprazolam  prescription for as-needed use. PDMP reviewed. Encouraged to contact if worsening symptoms, unusual behavior changes or suicidal thoughts occur.   Orders: -     ALPRAZolam ; Take 1 tablet (0.25  mg total) by mouth at bedtime.  Dispense: 14 tablet; Refill: 0 -     Escitalopram  Oxalate; Take 0.5 tablets (5 mg total) by mouth daily.  Dispense: 45 tablet; Refill: 3  Hyperlipidemia, unspecified hyperlipidemia type Assessment & Plan: Not on medication, declines statins. Managed with diet and exercise. Encourage healthy diet and regular exercise. Check lipid panel.   Orders: -     Lipid panel  Hypokalemia Assessment & Plan: Secondary to hydrochlorothiazide . Continue potassium supplementation. Refills provided. Check CMP today.   Orders: -     Comprehensive metabolic panel with GFR -     Potassium Chloride  Crys ER; Take 1 tablet (20 mEq total) by mouth daily.  Dispense: 90 tablet; Refill: 3  High serum vitamin D  -     VITAMIN D  25 Hydroxy (Vit-D Deficiency, Fractures)  High serum vitamin B12 -     Vitamin B12  Goiter -     TSH    Return in about 6 months (around 12/13/2024) for Follow up.   Leron Glance, NP-C [facility]

## 2024-06-13 LAB — LIPID PANEL
Cholesterol: 188 mg/dL (ref 0–200)
HDL: 46 mg/dL (ref >39.00)
LDL Cholesterol: 111 mg/dL — ABNORMAL HIGH (ref 0–99)
NonHDL: 142.27
Total CHOL/HDL Ratio: 4
Triglycerides: 158 mg/dL — ABNORMAL HIGH (ref 0.0–149.0)
VLDL: 31.6 mg/dL (ref 0.0–40.0)

## 2024-06-13 LAB — CBC WITH DIFFERENTIAL/PLATELET
Basophils Absolute: 0.1 K/uL (ref 0.0–0.1)
Basophils Relative: 1 % (ref 0.0–3.0)
Eosinophils Absolute: 0.2 K/uL (ref 0.0–0.7)
Eosinophils Relative: 4 % (ref 0.0–5.0)
HCT: 38.6 % (ref 36.0–46.0)
Hemoglobin: 12.7 g/dL (ref 12.0–15.0)
Lymphocytes Relative: 50.7 % — ABNORMAL HIGH (ref 12.0–46.0)
Lymphs Abs: 3 K/uL (ref 0.7–4.0)
MCHC: 32.8 g/dL (ref 30.0–36.0)
MCV: 91.9 fl (ref 78.0–100.0)
Monocytes Absolute: 0.3 K/uL (ref 0.1–1.0)
Monocytes Relative: 4.9 % (ref 3.0–12.0)
Neutro Abs: 2.3 K/uL (ref 1.4–7.7)
Neutrophils Relative %: 39.4 % — ABNORMAL LOW (ref 43.0–77.0)
Platelets: 342 K/uL (ref 150.0–400.0)
RBC: 4.2 Mil/uL (ref 3.87–5.11)
RDW: 13.8 % (ref 11.5–15.5)
WBC: 5.9 K/uL (ref 4.0–10.5)

## 2024-06-13 LAB — COMPREHENSIVE METABOLIC PANEL WITH GFR
ALT: 10 U/L (ref 0–35)
AST: 12 U/L (ref 0–37)
Albumin: 4.3 g/dL (ref 3.5–5.2)
Alkaline Phosphatase: 91 U/L (ref 39–117)
BUN: 10 mg/dL (ref 6–23)
CO2: 31 meq/L (ref 19–32)
Calcium: 9.4 mg/dL (ref 8.4–10.5)
Chloride: 100 meq/L (ref 96–112)
Creatinine, Ser: 0.8 mg/dL (ref 0.40–1.20)
GFR: 81.22 mL/min (ref >60.00)
Glucose, Bld: 82 mg/dL (ref 70–99)
Potassium: 3.5 meq/L (ref 3.5–5.1)
Sodium: 140 meq/L (ref 135–145)
Total Bilirubin: 0.4 mg/dL (ref 0.2–1.2)
Total Protein: 7.3 g/dL (ref 6.0–8.3)

## 2024-06-13 LAB — URINALYSIS, ROUTINE W REFLEX MICROSCOPIC
Bilirubin Urine: NEGATIVE
Hgb urine dipstick: NEGATIVE
Ketones, ur: NEGATIVE
Leukocytes,Ua: NEGATIVE
Nitrite: NEGATIVE
Specific Gravity, Urine: 1.025 (ref 1.000–1.030)
Total Protein, Urine: NEGATIVE
Urine Glucose: NEGATIVE
Urobilinogen, UA: 0.2 (ref 0.0–1.0)
pH: 5.5 (ref 5.0–8.0)

## 2024-06-13 LAB — VITAMIN B12: Vitamin B-12: 1247 pg/mL — ABNORMAL HIGH (ref 211–911)

## 2024-06-13 LAB — TSH: TSH: 0.62 u[IU]/mL (ref 0.35–5.50)

## 2024-06-13 LAB — HEMOGLOBIN A1C: Hgb A1c MFr Bld: 5.7 % (ref 4.6–6.5)

## 2024-06-13 LAB — VITAMIN D 25 HYDROXY (VIT D DEFICIENCY, FRACTURES): VITD: 55.98 ng/mL (ref 30.00–100.00)

## 2024-06-17 ENCOUNTER — Other Ambulatory Visit: Payer: Self-pay | Admitting: Nurse Practitioner

## 2024-06-17 ENCOUNTER — Ambulatory Visit: Payer: Self-pay | Admitting: Nurse Practitioner

## 2024-06-17 ENCOUNTER — Telehealth: Payer: Self-pay

## 2024-06-17 DIAGNOSIS — R3915 Urgency of urination: Secondary | ICD-10-CM

## 2024-06-17 MED ORDER — OXYBUTYNIN CHLORIDE ER 5 MG PO TB24: 5.0000 mg | ORAL_TABLET | Freq: Every day | ORAL | 3 refills | Status: AC

## 2024-06-17 NOTE — Telephone Encounter (Signed)
 Pt wants her results from 06/12/24

## 2024-06-17 NOTE — Telephone Encounter (Signed)
 Copied from CRM #[id number]. Topic: Clinical - Lab/Test Results >> Jun 17, 2024  8:07 AM Rosina BIRCH wrote: Reason for CRM: patient called stating she need not get her lab and urine results back yet CB [phone]

## 2024-06-18 ENCOUNTER — Encounter: Payer: Self-pay | Admitting: Nurse Practitioner

## 2024-06-18 NOTE — Assessment & Plan Note (Signed)
 Mood is well-managed on Lexapro  5 mg daily. An attempted discontinuation led to exacerbation of hot flashes. Continue Lexapro  at the current dose of half a pill daily. Anxiety is managed with as-needed alprazolam , used infrequently. They report increased work stress. Refill alprazolam  prescription for as-needed use. PDMP reviewed. Encouraged to contact if worsening symptoms, unusual behavior changes or suicidal thoughts occur.

## 2024-06-18 NOTE — Assessment & Plan Note (Addendum)
 Secondary to hydrochlorothiazide . Continue potassium supplementation. Refills provided. Check CMP today.

## 2024-06-18 NOTE — Assessment & Plan Note (Signed)
 They are managed with Wegovy , resulting in approximately 25-pound weight loss. Discuss long-term use for weight maintenance and benefits for kidney and heart health. Continue Wegovy  at 1.7 mg weekly. Encourage adequate protein intake and regular exercise. Discuss potential to adjust Wegovy  dosing based on weight maintenance goals. Check A1c today.

## 2024-06-18 NOTE — Assessment & Plan Note (Signed)
 They experience urgency without frequency or dysuria, with a recent UTI now resolved. The differential includes UTI or overactive bladder. Order a urinalysis to check for infection or abnormalities. If the urinalysis is clear, consider medication for overactive bladder and discuss potential side effects, such as dry mouth.

## 2024-06-18 NOTE — Assessment & Plan Note (Signed)
 Not on medication, declines statins. Managed with diet and exercise. Encourage healthy diet and regular exercise. Check lipid panel.

## 2024-06-18 NOTE — Assessment & Plan Note (Signed)
 Blood pressure is well controlled with hydrochlorothiazide  12.5 mg daily and telmisartan  20 mg daily. Continue the current antihypertensive regimen and monitor blood pressure at home if possible. Refills provided.

## 2024-06-30 ENCOUNTER — Other Ambulatory Visit: Payer: Self-pay

## 2024-06-30 DIAGNOSIS — K219 Gastro-esophageal reflux disease without esophagitis: Secondary | ICD-10-CM

## 2024-06-30 MED ORDER — PANTOPRAZOLE SODIUM 40 MG PO TBEC: 40.0000 mg | DELAYED_RELEASE_TABLET | Freq: Every day | ORAL | 3 refills | Status: AC

## 2024-07-17 ENCOUNTER — Other Ambulatory Visit: Payer: Self-pay

## 2024-07-17 DIAGNOSIS — I10 Essential (primary) hypertension: Secondary | ICD-10-CM

## 2024-07-17 MED ORDER — TELMISARTAN 20 MG PO TABS: 20.0000 mg | ORAL_TABLET | Freq: Every day | ORAL | 3 refills | Status: AC

## 2024-07-21 ENCOUNTER — Telehealth: Payer: Self-pay

## 2024-07-21 NOTE — Telephone Encounter (Signed)
 Copied from CRM [id number]. Topic: Clinical - Medication Question >> Jul 21, 2024 10:09 AM Avram MATSU wrote: Reason for CRM: patient would like to know if she can request a dose increase? Semaglutide -Weight Management (WEGOVY ) 1.7 MG/0.75ML SOAJ [[id number]] please [phone]  [facility] [address] ([address]),  - [address] ([address]) [place] [address] Phone: [phone] Fax: [phone]

## 2024-07-21 NOTE — Telephone Encounter (Signed)
 Spoke to pt. Pt stated she would like to move up to next dosage. Declined having any gi issues.

## 2024-07-23 ENCOUNTER — Other Ambulatory Visit: Payer: Self-pay | Admitting: Nurse Practitioner

## 2024-07-23 MED ORDER — WEGOVY 2.4 MG/0.75ML ~~LOC~~ SOAJ: 2.4000 mg | SUBCUTANEOUS | 5 refills | Status: DC

## 2024-08-19 ENCOUNTER — Other Ambulatory Visit (HOSPITAL_COMMUNITY): Payer: Self-pay

## 2024-08-21 ENCOUNTER — Other Ambulatory Visit (INDEPENDENT_AMBULATORY_CARE_PROVIDER_SITE_OTHER): Payer: Self-pay

## 2024-08-21 DIAGNOSIS — Z23 Encounter for immunization: Secondary | ICD-10-CM

## 2024-08-22 ENCOUNTER — Telehealth: Payer: Self-pay

## 2024-08-22 ENCOUNTER — Other Ambulatory Visit (HOSPITAL_COMMUNITY): Payer: Self-pay

## 2024-08-22 NOTE — Telephone Encounter (Signed)
 Pharmacy Patient Advocate Encounter  Received notification from [facility] that Prior Authorization for Wegovy  2.4MG /0.75ML auto-injectors has been APPROVED  to 10.7.26. Ran test claim, Copay is $RTS, RX LADT FILLED ON 10.7.25. This test claim was processed through [facility]- copay amounts may vary at other pharmacies due to pharmacy/plan contracts, or as the patient moves through the different stages of their insurance plan.   PA #/Case ID/Reference #: [id number]

## 2024-09-05 ENCOUNTER — Ambulatory Visit: Payer: Self-pay | Admitting: Nurse Practitioner

## 2024-09-05 NOTE — Telephone Encounter (Signed)
 Returned call to patient to inform her of Kenney Roys, FNP recommendations and unable to leave message due to voice mail box not being set up.  OK for E2C2 to relay note if patient calls back. If relayed, please notify the office.

## 2024-09-05 NOTE — Telephone Encounter (Addendum)
 Patient requesting to get the lowest dose of WeGovy  sent to the pharmacy due to having some possible side effects as noted in triage assessment. Patient would like the new prescription to go to the [facility] on [address] if sent in   [place] Only or Action Required?: Action required by provider: clinical question for provider and update on patient condition.  Patient was last seen in primary care on 06/12/2024 by Gretel App, NP.  Called Nurse Triage reporting Medication Problem.  Symptoms began when she started this dose prescribed on 07/23/2024.  Interventions attempted: Rest, hydration, or home remedies.  Symptoms are: gradually worsening.  Triage Disposition: Call PCP When Office is Open  Patient/caregiver understands and will follow disposition?: Yes                 Copied from CRM [id number]. Topic: Clinical - Prescription Issue >> Sep 05, 2024 10:56 AM [place] J wrote: Reason for CRM: Patient starting taking a higher dose of wegovy  (2.4 MG) and she noticed that she is having worsening anxiety as well as restlessness that comes with headaches. Reason for Disposition  [1] Caller has NON-URGENT medicine question about med that PCP prescribed AND [2] triager unable to answer question  Answer Assessment - Initial Assessment Questions Patient states that when she started taking Wegovy  2.4mg  she states that she started having worsening anxiety and having trouble sleeping/staying asleep. She states that she had nausea but now she is having worsening nausea in the evening or at night She states she was just getting nausea in the mornings.  Patient states that she would like to possibly go down to the lowest dose to maintain where she is at right now. She states that her weight hasn't changed so she is thinking she should just maintain where she is at.   Patient is advised to call us  back if anything changes or with any further questions/concerns. Patient is  advised that if anything worsens to go to the Emergency Room. Patient verbalized understanding.   1. NAME of MEDICINE: What medicine(s) are you calling about?     WeGovy  2.4mg  2. QUESTION: What is your question? (e.g., double dose of medicine, side effect)     Patient states she would like to go down to the lowest dose 3. PRESCRIBER: Who prescribed the medicine? Reason: if prescribed by specialist, call should be referred to that group.     PCP App Gretel, NP 4. SYMPTOMS: Do you have any symptoms? If Yes, ask: What symptoms are you having?  How bad are the symptoms (e.g., mild, moderate, severe)     Patient states nausea in the evenings, trouble sleeping/staying asleep, worsening anxiety 5. PREGNANCY:  Is there any chance that you are pregnant? When was your last menstrual period?     -----  Protocols used: Medication Question Call-A-AH

## 2024-09-05 NOTE — Telephone Encounter (Signed)
 Copied from CRM [id number]. Topic: Clinical - Medication Question >> Sep 05, 2024  4:44 PM Viola F wrote: Reason for CRM: Patient returned Jaggar Benko's phone call, I relayed Kenney Webb's message and patient was confused. She says Leron said it would be okay if she wants to lower the dosage of the wegovy  - I let patient know that Leron will be back in the office on Tuesday for review

## 2024-09-09 ENCOUNTER — Encounter: Payer: Self-pay | Admitting: Nurse Practitioner

## 2024-09-09 NOTE — Telephone Encounter (Signed)
 Copied from CRM [id number]. Topic: General - Other >> Sep 09, 2024 12:17 PM Kelly Pittman wrote: Reason for CRM: Patient called to check and see if Leron lester had seen her message regarding her Wegovy  and just waiting for a return call from her

## 2024-09-10 MED ORDER — WEGOVY 1 MG/0.5ML ~~LOC~~ SOAJ: 1.0000 mg | SUBCUTANEOUS | 3 refills | Status: AC

## 2024-09-10 MED ORDER — WEGOVY 1 MG/0.5ML ~~LOC~~ SOAJ: 1.0000 mg | SUBCUTANEOUS | 3 refills | Status: DC

## 2024-09-10 NOTE — Addendum Note (Signed)
 Addended by: ORLANDO KINGDOM on: 09/10/2024 11:48 AM   Modules accepted: Orders

## 2024-09-10 NOTE — Telephone Encounter (Signed)
 Mychart thread created with info

## 2024-10-27 ENCOUNTER — Ambulatory Visit
Admission: EM | Admit: 2024-10-27 | Discharge: 2024-10-27 | Disposition: A | Discharge status: Home / Self Care | Source: Ambulatory Visit | Attending: Emergency Medicine | Admitting: Emergency Medicine

## 2024-10-27 ENCOUNTER — Ambulatory Visit: Payer: Self-pay

## 2024-10-27 ENCOUNTER — Encounter: Payer: Self-pay | Admitting: Emergency Medicine

## 2024-10-27 DIAGNOSIS — H6502 Acute serous otitis media, left ear: Secondary | ICD-10-CM | POA: Diagnosis not present

## 2024-10-27 DIAGNOSIS — J0110 Acute frontal sinusitis, unspecified: Secondary | ICD-10-CM

## 2024-10-27 MED ORDER — AMOXICILLIN-POT CLAVULANATE 875-125 MG PO TABS: 1.0000 | ORAL_TABLET | Freq: Two times a day (BID) | ORAL | 0 refills | Status: AC

## 2024-10-27 MED ORDER — FLUCONAZOLE 150 MG PO TABS: 150.0000 mg | ORAL_TABLET | ORAL | 0 refills | Status: AC | PRN

## 2024-10-27 NOTE — ED Triage Notes (Signed)
 Patient complains sinus pressure and nasal congestion x 1 week. Patient has been doing sinus flushes with no improvement. Rates pain 8/10.

## 2024-10-27 NOTE — Discharge Instructions (Signed)
 You are being treated for sign infection and on exam I am able to see that her left ear is also infected  Take Augmentin  twice daily for 7 days for treatment of bacteria  You can take Tylenol  and/or Ibuprofen  as needed for fever reduction and pain relief.   For cough: honey 1/2 to 1 teaspoon (you can dilute the honey in water or another fluid).  You can also use guaifenesin and dextromethorphan for cough. You can use a humidifier for chest congestion and cough.  If you don't have a humidifier, you can sit in the bathroom with the hot shower running.      For sore throat: try warm salt water gargles, cepacol lozenges, throat spray, warm tea or water with lemon/honey, popsicles or ice, or OTC cold relief medicine for throat discomfort.   For congestion: take a daily anti-histamine like Zyrtec, Claritin, and a oral decongestant, such as pseudoephedrine.  You can also use Flonase  1-2 sprays in each nostril daily.   It is important to stay hydrated: drink plenty of fluids (water, gatorade/powerade/pedialyte, juices, or teas) to keep your throat moisturized and help further relieve irritation/discomfort.

## 2024-10-27 NOTE — Telephone Encounter (Signed)
 Pt states that she is at the walk-in clinic.  Reason for Triage: Patient called in requesting to be seen as soon as possible. She has had the following symptoms for over a week now: - Headaches -Head Pressure above Rt. Eye - Sinus Drainage.

## 2024-10-27 NOTE — ED Provider Notes (Signed)
 Kelly Pittman    CSN: [id number] Arrival date & time: 10/27/24  1421      History   Chief Complaint Chief Complaint  Patient presents with   Facial Pain   Nasal Congestion    HPI Kelly Pittman is a 58 y.o. female.   Patient presents for evaluation of nasal congestion, intermittent bilateral ear pain and itching, sinus pain and pressure to the forehead present for 7 days.  History of reoccurring sinusitis.  Has attempted use of sinus flushes without relief.  Tolerable to food and liquids.  No known sick contacts.  Denies fever, cough.      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  08/2018: low titer ANA 1/80 nucleolar speckled  Normal RF/CCP   Post-nasal drip 02/18/2015   UTI (urinary tract infection)     Patient Active Problem List   Diagnosis Date Noted   Urinary urgency 06/12/2024   Foot swelling 11/12/2023   History of thrush 09/05/2023   Breast cancer screening by mammogram 09/05/2023   Pain in right shoulder 08/21/2023   Stiffness of right shoulder, not elsewhere classified 08/21/2023   Derangement of right shoulder joint 07/18/2023   Hypokalemia 07/09/2023   High serum vitamin B12 07/09/2023   Mood disorder 06/09/2023   Encounter for screening for HIV 06/09/2023   Hyperglycemia 06/09/2023   High serum vitamin D  06/09/2023   Sinus congestion 03/06/2022   Sprain of wrist 01/18/2022   Cervical arthritis 04/04/2021   Thoracic arthritis 04/04/2021   Allergic rhinitis  02/11/2020   Raynaud's phenomenon without gangrene 02/20/2019   Tingling in extremities 02/20/2019   Gastroesophageal reflux disease 02/20/2019   Annual physical exam 08/21/2018   HLD (hyperlipidemia) 08/21/2018   Thyroid  nodule 07/22/2018   Goiter 07/22/2018   Essential hypertension 02/18/2015   Seasonal allergies 02/18/2015   Spastic pelvic floor syndrome 06/05/2014   Obesity 03/27/2014    Past Surgical History:  Procedure Laterality Date   ABDOMINAL HYSTERECTOMY     2009   BREAST BIOPSY Right 2003   CARPAL TUNNEL RELEASE     COLONOSCOPY WITH PROPOFOL  N/A 05/18/2020   Procedure: COLONOSCOPY WITH PROPOFOL ;  Surgeon: Therisa Bi, MD;  Location: [facility] ENDOSCOPY;  Service: Gastroenterology;  Laterality: N/A;   COLONOSCOPY WITH PROPOFOL  N/A 05/19/2020   Procedure: COLONOSCOPY WITH PROPOFOL ;  Surgeon: Therisa Bi, MD;  Location: [facility] ENDOSCOPY;  Service: Gastroenterology;  Laterality: N/A;   FOOT SURGERY     right   TONSILLECTOMY     1987   UNILATERAL SALPINGECTOMY     left    OB History     Gravida  3   Para  0   Term  0   Preterm  0   AB  3   Living  1      SAB  3   IAB  0   Ectopic  0   Multiple  0   Live Births  Obstetric Comments  Patient has 1 adopted daughter          Home Medications    Prior to Admission medications  Medication Sig Start Date End Date Taking? Authorizing Provider  ALPRAZolam  (XANAX ) 0.25 MG tablet Take 1 tablet (0.25 mg total) by mouth at bedtime. 06/12/24   Gretel App, NP  calcium carbonate (CALCIUM 600) 600 MG TABS tablet Take 600 mg by mouth daily.    [provider]  escitalopram  (LEXAPRO ) 10 MG tablet Take 0.5 tablets (5 mg total) by mouth daily. 06/12/24   Gretel App, NP  hydrochlorothiazide  (HYDRODIURIL ) 12.5 MG tablet Take 1 tablet (12.5 mg total) by mouth every morning. 06/12/24   Gretel App, NP  latanoprost (XALATAN) 0.005 % ophthalmic solution 1 drop at bedtime. 11/14/21   [provider]  levocetirizine (XYZAL ) 5 MG tablet Take 1 tablet (5 mg total) by mouth every evening. 05/03/23   Hope Merle, MD  mometasone  (NASONEX ) 50 MCG/ACT nasal spray PLACE 2 SPRAYS INTO THE NOSE DAILY. 02/25/24   Hope Merle, MD  Multiple Vitamin (MULTIVITAMIN) capsule Take by mouth daily.     [provider]  Nutritional Supplements (ESTROVEN PO) Take by mouth.     [provider]  ofloxacin  (FLOXIN ) 0.3 % OTIC solution Place 10 drops into the left ear daily. 06/04/24   Delesha Pohlman, Shelba SAUNDERS, NP  oxybutynin  (DITROPAN  XL) 5 MG 24 hr tablet Take 1 tablet (5 mg total) by mouth at bedtime. 06/17/24   Gretel App, NP  pantoprazole  (PROTONIX ) 40 MG tablet Take 1 tablet (40 mg total) by mouth daily. 06/30/24   Gretel App, NP  potassium chloride  SA (KLOR-CON  M) 20 MEQ tablet Take 1 tablet (20 mEq total) by mouth daily. 06/12/24   Gretel App, NP  semaglutide -weight management (WEGOVY ) 1 MG/0.5ML SOAJ SQ injection Inject 1 mg into the skin once a week. 09/10/24   Gretel App, NP  telmisartan  (MICARDIS ) 20 MG tablet Take 1 tablet (20 mg total) by mouth daily. 07/17/24   Gretel App, NP    Family History Family History  Problem Relation Age of Onset   Hypertension Mother    Hyperlipidemia Mother    Hypertension Father    Cancer Cousin        bone cancer   Breast cancer Neg Hx    Ovarian cancer Neg Hx    Stroke Neg Hx    Thyroid  disease Neg Hx     Social History Social History[1]   Allergies   Losartan potassium-hctz and Tape   Review of Systems Review of Systems  Constitutional: Negative.   HENT:  Positive for congestion, ear pain, sinus pressure and sinus pain. Negative for dental problem, drooling, ear discharge, facial swelling, hearing loss, mouth sores, nosebleeds, postnasal drip, rhinorrhea, sneezing, sore throat, tinnitus, trouble swallowing and voice change.      Physical Exam Triage Vital Signs ED Triage Vitals  Encounter Vitals Group     BP 10/27/24 1533 117/74      Girls Systolic BP Percentile --      Girls Diastolic BP Percentile --      Boys Systolic BP Percentile --      Boys Diastolic BP Percentile --      Pulse Rate 10/27/24 1533 61     Resp 10/27/24 1533 20     Temp 10/27/24 1533 98.7 F (37.1 C)     Temp Source 10/27/24 1533 Oral     SpO2 10/27/24 1533 97 %  Weight --      Height --      Head Circumference --      Peak Flow --      Pain Score 10/27/24 1531 8     Pain Loc --      Pain Education --      Exclude from Growth Chart --    No data found.  Updated Vital Signs BP 117/74 (BP Location: Left Arm)   Pulse 61   Temp 98.7 F (37.1 C) (Oral)   Resp 20   SpO2 97%   Visual Acuity Right Eye Distance:   Left Eye Distance:   Bilateral Distance:    Right Eye Near:   Left Eye Near:    Bilateral Near:     Physical Exam Constitutional:      Appearance: Normal appearance.  HENT:     Right Ear: Tympanic membrane, ear canal and external ear normal.     Left Ear: Tympanic membrane is erythematous.     Nose: Congestion present.     Right Sinus: Frontal sinus tenderness present. No maxillary sinus tenderness.     Left Sinus: Frontal sinus tenderness present. No maxillary sinus tenderness.     Mouth/Throat:     Pharynx: No oropharyngeal exudate or posterior oropharyngeal erythema.  Eyes:     Extraocular Movements: Extraocular movements intact.  Pulmonary:     Effort: Pulmonary effort is normal.  Neurological:     Mental Status: She is alert and oriented to person, place, and time. Mental status is at baseline.      UC Treatments / Results  Labs (all labs ordered are listed, but only abnormal results are displayed) Labs Reviewed - No data to display  EKG   Radiology No results found.  Procedures Procedures (including critical care time)  Medications Ordered in UC Medications - No data to display  Initial Impression / Assessment and Plan / UC Course  I have reviewed the triage vital signs and the  nursing notes.  Pertinent labs & imaging results that were available during my care of the patient were reviewed by me and considered in my medical decision making (see chart for details).  Nonrecurrent acute serous otitis media of the left ear, acute nonrecurrent frontal sinusitis  Patient is in no signs of distress nor toxic appearing.  Vital signs are stable.  Low suspicion for pneumonia, pneumothorax or bronchitis and therefore will defer imaging.  Viral testing deferred due to timeline, presentation consistent with a sinusitis as well as erythema noted to the left tympanic membrane on exam, discussed all findings with patient, prescribed Augmentin , declined steroid course. May use additional over-the-counter medications as needed for supportive care.  May follow-up with urgent care as needed if symptoms persist or worsen.  Final Clinical Impressions(s) / UC Diagnoses   Final diagnoses:  None   Discharge Instructions   None    ED Prescriptions   None    PDMP not reviewed this encounter.     [1]  Social History Tobacco Use   Smoking status: Former    Current packs/day: 0.00    Average packs/day: 0.3 packs/day    Types: Cigarettes    Quit date: 2020    Years since quitting: 5.9   Smokeless tobacco: Never  Vaping Use   Vaping status: Never Used  Substance Use Topics   Alcohol use: Yes    Comment: socially   Drug use: No     Teresa Shelba SAUNDERS, NP 10/27/24 1547

## 2024-10-27 NOTE — Telephone Encounter (Signed)
 Noted

## 2024-11-12 ENCOUNTER — Ambulatory Visit
Admission: EM | Admit: 2024-11-12 | Discharge: 2024-11-12 | Disposition: A | Discharge status: Home / Self Care | Attending: Emergency Medicine | Admitting: Emergency Medicine

## 2024-11-12 DIAGNOSIS — J069 Acute upper respiratory infection, unspecified: Secondary | ICD-10-CM | POA: Diagnosis not present

## 2024-11-12 MED ORDER — BENZONATATE 100 MG PO CAPS: 100.0000 mg | ORAL_CAPSULE | Freq: Three times a day (TID) | ORAL | 0 refills | Status: AC | PRN

## 2024-11-12 MED ORDER — PROMETHAZINE-DM 6.25-15 MG/5ML PO SYRP: 5.0000 mL | ORAL_SOLUTION | Freq: Four times a day (QID) | ORAL | 0 refills | Status: AC | PRN

## 2024-11-12 NOTE — ED Triage Notes (Signed)
 Patient to Urgent Care with complaints of cough/ sinus pressure/ nasal congestion/ chest congestion.   Symptoms x5 days. Repots recent sinus infection- concerned it didn't resolve.  Using robitussin-DM

## 2024-11-12 NOTE — ED Provider Notes (Signed)
 " [name]    CSN: [id number] Arrival date & time: 11/12/24  0846      History   Chief Complaint Chief Complaint  Patient presents with   Cough   Sore Throat    HPI Kelly Pittman is a 58 y.o. female.  Patient presents with 4-day history of congestion and cough.  No fever, shortness of breath, vomiting, diarrhea.  She has been treating her symptoms with Robitussin DM.  Patient was seen at this urgent care on 10/27/2024; diagnosed with sinusitis and otitis media; treated with Augmentin  and Flonase  nasal spray.  She states she completed these medications and her symptoms resolved completely at that time.  The history is provided by the patient and medical records.    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  08/2018: low titer ANA 1/80 nucleolar speckled  Normal RF/CCP   Post-nasal drip 02/18/2015   UTI (urinary tract infection)     Patient Active Problem List   Diagnosis Date Noted   Urinary urgency 06/12/2024   Foot swelling 11/12/2023   History of thrush 09/05/2023   Breast cancer screening by mammogram 09/05/2023   Pain in right shoulder 08/21/2023   Stiffness of right shoulder, not elsewhere classified 08/21/2023   Derangement of right shoulder joint 07/18/2023   Hypokalemia 07/09/2023   High serum vitamin B12 07/09/2023   Mood disorder 06/09/2023   Encounter for screening for HIV 06/09/2023   Hyperglycemia 06/09/2023   High serum vitamin D  06/09/2023   Sinus congestion  03/06/2022   Sprain of wrist 01/18/2022   Cervical arthritis 04/04/2021   Thoracic arthritis 04/04/2021   Allergic rhinitis 02/11/2020   Raynaud's phenomenon without gangrene 02/20/2019   Tingling in extremities 02/20/2019   Gastroesophageal reflux disease 02/20/2019   Annual physical exam 08/21/2018   HLD (hyperlipidemia) 08/21/2018   Thyroid  nodule 07/22/2018   Goiter 07/22/2018   Essential hypertension 02/18/2015   Seasonal allergies 02/18/2015   Spastic pelvic floor syndrome 06/05/2014   Obesity 03/27/2014    Past Surgical History:  Procedure Laterality Date   ABDOMINAL HYSTERECTOMY     2009   BREAST BIOPSY Right 2003   CARPAL TUNNEL RELEASE     COLONOSCOPY WITH PROPOFOL  N/A 05/18/2020   Procedure: COLONOSCOPY WITH PROPOFOL ;  Surgeon: Therisa Bi, MD;  Location: [facility] ENDOSCOPY;  Service: Gastroenterology;  Laterality: N/A;   COLONOSCOPY WITH PROPOFOL  N/A 05/19/2020   Procedure: COLONOSCOPY WITH PROPOFOL ;  Surgeon: Therisa Bi, MD;  Location: [facility] ENDOSCOPY;  Service: Gastroenterology;  Laterality: N/A;   FOOT SURGERY     right   TONSILLECTOMY     1987   UNILATERAL SALPINGECTOMY     left    OB History     Gravida  3   Para  0   Term  0   Preterm  0   AB  3   Living  1      SAB  3   IAB  0   Ectopic  0   Multiple  0   Live Births           Obstetric Comments  Patient has 1 adopted daughter          Home Medications    Prior to Admission medications  Medication Sig Start Date End Date Taking? Authorizing Provider  benzonatate (TESSALON) 100 MG capsule Take 1 capsule (100 mg total) by mouth 3 (three) times daily as needed for cough. 11/12/24  Yes Corlis Burnard DEL, NP  promethazine -dextromethorphan (PROMETHAZINE -DM) 6.25-15 MG/5ML syrup Take 5 mLs by mouth 4 (four) times daily as needed. 11/12/24  Yes Corlis Burnard DEL, NP  ALPRAZolam  (XANAX ) 0.25 MG tablet Take 1 tablet (0.25 mg total) by mouth at bedtime. 06/12/24   Gretel App, NP   amoxicillin -clavulanate (AUGMENTIN ) 875-125 MG tablet Take 1 tablet by mouth every 12 (twelve) hours. Patient not taking: Reported on 11/12/2024 10/27/24   Teresa Shelba SAUNDERS, NP  calcium carbonate (CALCIUM 600) 600 MG TABS tablet Take 600 mg by mouth daily.    [provider]  escitalopram  (LEXAPRO ) 10 MG tablet Take 0.5 tablets (5 mg total) by mouth daily. 06/12/24   Gretel App, NP  fluconazole  (DIFLUCAN ) 150 MG tablet Take 1 tablet (150 mg total) by mouth every three (3) days as needed for up to 2 doses. Patient not taking: Reported on 11/12/2024 10/27/24   Teresa Shelba SAUNDERS, NP  hydrochlorothiazide  (HYDRODIURIL ) 12.5 MG tablet Take 1 tablet (12.5 mg total) by mouth every morning. 06/12/24   Gretel App, NP  latanoprost (XALATAN) 0.005 % ophthalmic solution 1 drop at bedtime. 11/14/21   [provider]  levocetirizine (XYZAL ) 5 MG tablet Take 1 tablet (5 mg total) by mouth every evening. 05/03/23   Hope Merle, MD  mometasone  (NASONEX ) 50 MCG/ACT nasal spray PLACE 2 SPRAYS INTO THE NOSE DAILY. 02/25/24   Hope Merle, MD  Multiple Vitamin (MULTIVITAMIN) capsule Take by mouth daily.     [provider]  Nutritional Supplements (ESTROVEN PO) Take by mouth.     [provider]  ofloxacin  (FLOXIN ) 0.3 % OTIC solution Place 10 drops into the left ear daily. 06/04/24   White, Shelba SAUNDERS, NP  oxybutynin  (DITROPAN  XL) 5 MG 24 hr tablet Take 1 tablet (5 mg total) by mouth at bedtime. 06/17/24   Gretel App, NP  pantoprazole  (PROTONIX ) 40 MG tablet Take 1 tablet (40 mg total) by mouth daily. 06/30/24   Gretel App, NP  potassium chloride  SA (KLOR-CON  M) 20 MEQ tablet Take 1 tablet (20 mEq total) by mouth daily. 06/12/24   Gretel App, NP  semaglutide -weight management (WEGOVY ) 1 MG/0.5ML SOAJ SQ injection Inject 1 mg into the skin once a week. 09/10/24   Gretel App, NP  telmisartan  (MICARDIS ) 20 MG tablet Take 1 tablet (20 mg total) by mouth daily. 07/17/24   Gretel App,  NP    Family History Family History  Problem Relation Age of Onset   Hypertension Mother    Hyperlipidemia Mother    Hypertension Father    Cancer Cousin        bone cancer   Breast cancer Neg Hx    Ovarian cancer Neg Hx    Stroke Neg Hx    Thyroid  disease Neg Hx     Social History Social History[1]   Allergies   Losartan potassium-hctz and Tape   Review of Systems Review of Systems  Constitutional:  Negative for chills and fever.  HENT:  Positive for  congestion, postnasal drip and rhinorrhea. Negative for ear pain and sore throat.   Respiratory:  Positive for cough. Negative for shortness of breath.      Physical Exam Triage Vital Signs ED Triage Vitals  Encounter Vitals Group     BP 11/12/24 1007 116/76     Girls Systolic BP Percentile --      Girls Diastolic BP Percentile --      Boys Systolic BP Percentile --      Boys Diastolic BP Percentile --      Pulse Rate 11/12/24 1007 (!) 59     Resp 11/12/24 1007 18     Temp 11/12/24 1007 97.6 F (36.4 C)     Temp src --      SpO2 11/12/24 1007 98 %     Weight --      Height --      Head Circumference --      Peak Flow --      Pain Score 11/12/24 1014 0     Pain Loc --      Pain Education --      Exclude from Growth Chart --    No data found.  Updated Vital Signs BP 116/76   Pulse (!) 59   Temp 97.6 F (36.4 C)   Resp 18   SpO2 98%   Visual Acuity Right Eye Distance:   Left Eye Distance:   Bilateral Distance:    Right Eye Near:   Left Eye Near:    Bilateral Near:     Physical Exam Constitutional:      General: She is not in acute distress. HENT:     Right Ear: Tympanic membrane normal.     Left Ear: Tympanic membrane normal.     Nose: Rhinorrhea present.     Mouth/Throat:     Mouth: Mucous membranes are moist.     Pharynx: Oropharynx is clear.  Cardiovascular:     Rate and Rhythm: Normal rate and regular rhythm.     Heart sounds: Normal heart sounds.  Pulmonary:     Effort:  Pulmonary effort is normal. No respiratory distress.     Breath sounds: Normal breath sounds.  Neurological:     Mental Status: She is alert.      UC Treatments / Results  Labs (all labs ordered are listed, but only abnormal results are displayed) Labs Reviewed - No data to display  EKG   Radiology No results found.  Procedures Procedures (including critical care time)  Medications Ordered in UC Medications - No data to display  Initial Impression / Assessment and Plan / UC Course  I have reviewed the triage vital signs and the nursing notes.  Pertinent labs & imaging results that were available during my care of the patient were reviewed by me and considered in my medical decision making (see chart for details).    Viral URI.  Afebrile and vital signs are stable.  Lungs are clear and O2 sat is 98% on room air.  Patient has been symptomatic for 4 days.  She was recently treated with an antibiotic.  Treating today with Tessalon Perles and Promethazine  DM.  Precautions for drowsiness with promethazine  discussed.  Education provided on viral respiratory infection.  Instructed her to follow-up with her PCP.  ED precautions given.  She agrees to plan of care.  Final Clinical Impressions(s) / UC Diagnoses   Final diagnoses:  Viral URI     Discharge Instructions  Take the Tessalon Perles and Promethazine  DM as directed.  Do not drive, operate machinery, drink alcohol, or perform dangerous activities while taking promethazine  as it may cause drowsiness.    Follow up with your primary care provider.  Go to the emergency department if you have worsening symptoms.         ED Prescriptions     Medication Sig Dispense Auth. Provider   benzonatate (TESSALON) 100 MG capsule Take 1 capsule (100 mg total) by mouth 3 (three) times daily as needed for cough. 21 capsule Corlis Burnard DEL, NP   promethazine -dextromethorphan (PROMETHAZINE -DM) 6.25-15 MG/5ML syrup Take 5 mLs by mouth  4 (four) times daily as needed. 118 mL Corlis Burnard DEL, NP      PDMP not reviewed this encounter.    [1]  Social History Tobacco Use   Smoking status: Former    Current packs/day: 0.00    Average packs/day: 0.3 packs/day    Types: Cigarettes    Quit date: 2020    Years since quitting: 6.0   Smokeless tobacco: Never  Vaping Use   Vaping status: Never Used  Substance Use Topics   Alcohol use: Yes    Comment: socially   Drug use: No     Corlis Burnard DEL, NP 11/12/24 1110  "

## 2024-11-12 NOTE — Discharge Instructions (Addendum)
 Take the Tessalon Perles and Promethazine  DM as directed.  Do not drive, operate machinery, drink alcohol, or perform dangerous activities while taking promethazine  as it may cause drowsiness.    Follow up with your primary care provider.  Go to the emergency department if you have worsening symptoms.

## 2024-12-12 ENCOUNTER — Other Ambulatory Visit: Payer: Self-pay | Admitting: Nurse Practitioner

## 2024-12-12 DIAGNOSIS — Z1231 Encounter for screening mammogram for malignant neoplasm of breast: Secondary | ICD-10-CM

## 2024-12-17 ENCOUNTER — Encounter: Payer: Self-pay | Admitting: *Deleted

## 2024-12-17 ENCOUNTER — Other Ambulatory Visit: Payer: Self-pay

## 2024-12-17 ENCOUNTER — Emergency Department

## 2024-12-17 DIAGNOSIS — S46912A Strain of unspecified muscle, fascia and tendon at shoulder and upper arm level, left arm, initial encounter: Secondary | ICD-10-CM | POA: Insufficient documentation

## 2024-12-17 DIAGNOSIS — K219 Gastro-esophageal reflux disease without esophagitis: Secondary | ICD-10-CM | POA: Insufficient documentation

## 2024-12-17 DIAGNOSIS — X58XXXA Exposure to other specified factors, initial encounter: Secondary | ICD-10-CM | POA: Insufficient documentation

## 2024-12-17 LAB — BASIC METABOLIC PANEL WITH GFR
Anion gap: 12 (ref 5–15)
BUN: 9 mg/dL (ref 6–20)
CO2: 27 mmol/L (ref 22–32)
Calcium: 10 mg/dL (ref 8.9–10.3)
Chloride: 102 mmol/L (ref 98–111)
Creatinine, Ser: 0.71 mg/dL (ref 0.44–1.00)
GFR, Estimated: >60 mL/min
Glucose, Bld: 115 mg/dL — ABNORMAL HIGH (ref 70–99)
Potassium: 3.9 mmol/L (ref 3.5–5.1)
Sodium: 142 mmol/L (ref 135–145)

## 2024-12-17 LAB — CBC
HCT: 37.4 % (ref 36.0–46.0)
Hemoglobin: 12.5 g/dL (ref 12.0–15.0)
MCH: 30.8 pg (ref 26.0–34.0)
MCHC: 33.4 g/dL (ref 30.0–36.0)
MCV: 92.1 fL (ref 80.0–100.0)
Platelets: 304 10*3/uL (ref 150–400)
RBC: 4.06 MIL/uL (ref 3.87–5.11)
RDW: 13.1 % (ref 11.5–15.5)
WBC: 7.5 10*3/uL (ref 4.0–10.5)
nRBC: 0 % (ref 0.0–0.2)

## 2024-12-17 LAB — TROPONIN T, HIGH SENSITIVITY: Troponin T High Sensitivity: <6 ng/L (ref 0–19)

## 2024-12-17 NOTE — ED Triage Notes (Signed)
 Pt ambulatory to triage.  Pt has chest pain for 4 days.  No cough.  No sob.  No n/v  pt alert.

## 2024-12-18 ENCOUNTER — Other Ambulatory Visit: Payer: Self-pay | Admitting: Nurse Practitioner

## 2024-12-18 ENCOUNTER — Emergency Department
Admission: EM | Admit: 2024-12-18 | Discharge: 2024-12-18 | Disposition: A | Attending: Emergency Medicine | Admitting: Emergency Medicine

## 2024-12-18 DIAGNOSIS — R0789 Other chest pain: Secondary | ICD-10-CM

## 2024-12-18 DIAGNOSIS — T148XXA Other injury of unspecified body region, initial encounter: Secondary | ICD-10-CM

## 2024-12-18 DIAGNOSIS — F39 Unspecified mood [affective] disorder: Secondary | ICD-10-CM

## 2024-12-18 DIAGNOSIS — K219 Gastro-esophageal reflux disease without esophagitis: Secondary | ICD-10-CM

## 2024-12-18 LAB — TROPONIN T, HIGH SENSITIVITY: Troponin T High Sensitivity: <6 ng/L (ref 0–19)

## 2024-12-18 MED ORDER — ONDANSETRON 4 MG PO TBDP: ORAL_TABLET | ORAL | 0 refills | Status: AC

## 2024-12-18 MED ORDER — ACETAMINOPHEN 500 MG PO TABS
1000.0000 mg | ORAL_TABLET | Freq: Once | ORAL | Status: AC
  Administered 2024-12-18: 1000 mg via ORAL
  Filled 2024-12-18: qty 2

## 2024-12-18 MED ORDER — SUCRALFATE 1 G PO TABS: 1.0000 g | ORAL_TABLET | Freq: Four times a day (QID) | ORAL | 1 refills | Status: AC | PRN

## 2024-12-18 MED ORDER — ALUM & MAG HYDROXIDE-SIMETH 200-200-20 MG/5ML PO SUSP
30.0000 mL | Freq: Once | ORAL | Status: AC
  Administered 2024-12-18: 30 mL via ORAL
  Filled 2024-12-18: qty 30

## 2024-12-18 MED ORDER — SUCRALFATE 1 G PO TABS
1.0000 g | ORAL_TABLET | Freq: Once | ORAL | Status: AC
  Administered 2024-12-18: 1 g via ORAL
  Filled 2024-12-18: qty 1

## 2024-12-18 NOTE — ED Provider Notes (Signed)
 "  [facility] Provider Note    Event Date/Time   First MD Initiated Contact with Patient 12/18/24 0130     (approximate)   History   Chest Pain   HPI Kelly Pittman is a 59 y.o. female who presents for evaluation of about 4 days of aching pain in the middle of her chest.  She also says that she is having some soreness in her back, specifically in the left scapular region.  The pain in her back started after she did some heavy strenuous mopping.  She can reproduce the pain by moving her left shoulder and back in certain ways and it hurts little bit when somebody pushes on it.  The chest pain is not reproducible but she said it also kind of feels like a sore muscle.  However she is also having increased reflux which she noticed after she made some chili on about the same night that she did the mopping.  She has had a little bit of nausea but no vomiting.  No shortness of breath nor fever.  No other abdominal pain.  She cannot remember exactly what she takes for the acid reflux but she thinks it might be omeprazole     Physical Exam   Triage Vital Signs: ED Triage Vitals  Encounter Vitals Group     BP 12/17/24 2234 (!) 159/84     Girls Systolic BP Percentile --      Girls Diastolic BP Percentile --      Boys Systolic BP Percentile --      Boys Diastolic BP Percentile --      Pulse Rate 12/17/24 2234 80     Resp 12/17/24 2234 18     Temp 12/17/24 2234 98.2 F (36.8 C)     Temp Source 12/17/24 2234 Oral     SpO2 12/17/24 2234 99 %     Weight 12/17/24 2231 79.4 kg (175 lb)     Height 12/17/24 2231 1.626 m (5' 4)     Head Circumference --      Peak Flow --      Pain Score 12/17/24 2231 5     Pain Loc --      Pain Education --      Exclude from Growth Chart --     Most recent vital signs: Vitals:   12/17/24 2234 12/18/24 0200  BP: (!) 159/84 (!) 164/83  Pulse: 80 61  Resp: 18 18  Temp: 98.2 F (36.8 C) 98.2 F (36.8 C)  SpO2: 99% 100%     General: Awake, no distress.  Pleasant and conversant, generally well-appearing. CV:  Good peripheral perfusion.  Normal heart sounds, regular rate and rhythm Resp:  Normal effort. Speaking easily and comfortably, no accessory muscle usage nor intercostal retractions.  Lungs are clear to auscultation bilaterally. Abd:  No distention.  No tenderness to palpation of the lower abdomen nor of the epigastrium nor right upper quadrant with negative Murphy sign. Other:  Patient has reproducible left shoulder and scapular tenderness to palpation consistent with musculoskeletal strain.  No focal neurological deficits.   ED Results / Procedures / Treatments   Labs (all labs ordered are listed, but only abnormal results are displayed) Labs Reviewed  BASIC METABOLIC PANEL WITH GFR - Abnormal; Notable for the following components:      Result Value   Glucose, Bld 115 (*)    All other components within normal limits  CBC  TROPONIN T, HIGH SENSITIVITY  TROPONIN T, HIGH SENSITIVITY     EKG  ED ECG REPORT I, Darleene Dome, the attending physician, personally viewed and interpreted this ECG.  Date: 12/17/2024 EKG Time: 22: 41 Rate: 64 Rhythm: normal sinus rhythm QRS Axis: normal Intervals: normal ST/T Wave abnormalities: Non-specific ST segment / T-wave changes, but no clear evidence of acute ischemia. Narrative Interpretation: no definitive evidence of acute ischemia; does not meet STEMI criteria.    RADIOLOGY I independently viewed and interpreted the patient's two-view chest x-ray and I see no evidence of pneumonia, widened mediastinum, nor other acute abnormality.  I also read the radiologist's report, which confirmed no acute findings.   PROCEDURES:  Critical Care performed: No  Procedures    IMPRESSION / MDM / ASSESSMENT AND PLAN / ED COURSE  I reviewed the triage vital signs and the nursing notes.                              Differential diagnosis includes, but is  not limited to, musculoskeletal strain, acid reflux, biliary colic, ACS, PE, pneumonia, pneumothorax  Patient's presentation is most consistent with acute presentation with potential threat to life or bodily function.  Labs/studies ordered (see ED course for additional labs and studies that may have been added later): EKG, two-view chest x-ray, CBC, high-sensitivity troponin x 2, basic metabolic panel  Interventions/Medications given:  Medications  acetaminophen  (TYLENOL ) tablet 1,000 mg (1,000 mg Oral Given 12/18/24 0154)  sucralfate  (CARAFATE ) tablet 1 g (1 g Oral Given 12/18/24 0154)  alum & mag hydroxide-simeth (MAALOX/MYLANTA) 200-200-20 MG/5ML suspension 30 mL (30 mLs Oral Given 12/18/24 0154)    (Note:  hospital course my include additional interventions and/or labs/studies not listed above.)   Patient is low risk for ACS based on HEART score and has a Wells score for PE of 0.  She has reproducible left scapular pain after exerting herself and she has had increased acid reflux which I think is most likely accounting for the discomfort in her chest.  I initially considered hospitalization but I do not think that hospitalization for additional cardiac workup will be necessary or appropriate.  I ordered medication as listed above to help with her symptoms and prescriptions as listed below.  She is comfortable with the plan for following up with her PCP and given her low risk I doubt that she would benefit from cardiology referral at this time.  The patient's medical screening exam is reassuring with no indication of an emergent medical condition requiring hospitalization or additional evaluation at this point.  The patient is safe and appropriate for discharge and outpatient follow up.  Of note, initially considered adding on LFTs and a lipase, but given absolutely no tenderness to palpation of the epigastrium nor right upper quadrant I think that biliary colic and gallbladder disease in general is  very unlikely         FINAL CLINICAL IMPRESSION(S) / ED DIAGNOSES   Final diagnoses:  Atypical chest pain  Gastroesophageal reflux disease, unspecified whether esophagitis present  Muscle strain     Rx / DC Orders   ED Discharge Orders          Ordered    sucralfate  (CARAFATE ) 1 g tablet  4 times daily PRN        12/18/24 0204    ondansetron  (ZOFRAN -ODT) 4 MG disintegrating tablet        12/18/24 0204  Note:  This document was prepared using Dragon voice recognition software and may include unintentional dictation errors.   Gordan Huxley, MD 12/18/24 [phone]  "

## 2024-12-18 NOTE — Discharge Instructions (Signed)
 As we discussed, your evaluation was reassuring tonight.  You may be having a combination of worsening acid reflux and muscle strain in your back.  We recommend that you try taking your normal acid reflux medicine twice daily for about a week and see if this makes a difference.  You can also take the prescribed medications.  We suggest that you avoid taking ibuprofen  (Motrin ) because this may worsen your reflux and abdominal discomfort, but try taking Tylenol  (acetaminophen ) 1000 mg no more often than every 6 hours.  This will likely help with your discomfort.  Please follow-up with your regular primary care provider at the next available opportunity and return to the emergency department if you develop new or worsening symptoms that concern you.

## 2025-01-02 ENCOUNTER — Ambulatory Visit: Admitting: Nurse Practitioner

## 2025-01-16 ENCOUNTER — Encounter

## 2025-02-27 ENCOUNTER — Ambulatory Visit: Admitting: Nurse Practitioner
